# Patient Record
Sex: Female | Born: 1935 | Race: White | Hispanic: No | Smoking: Never smoker
Health system: Southern US, Community
[De-identification: ages and names within clinical notes are randomized; demographics above are authoritative.]

## PROBLEM LIST (undated history)

## (undated) ENCOUNTER — Inpatient Hospital Stay: Admission: AD | Payer: Medicare Other | Source: Other Acute Inpatient Hospital | Admitting: Internal Medicine

## (undated) DIAGNOSIS — F0390 Unspecified dementia without behavioral disturbance: Secondary | ICD-10-CM

## (undated) DIAGNOSIS — F32A Depression, unspecified: Secondary | ICD-10-CM

## (undated) DIAGNOSIS — F329 Major depressive disorder, single episode, unspecified: Secondary | ICD-10-CM

## (undated) DIAGNOSIS — I10 Essential (primary) hypertension: Secondary | ICD-10-CM

## (undated) DIAGNOSIS — E039 Hypothyroidism, unspecified: Secondary | ICD-10-CM

## (undated) DIAGNOSIS — F419 Anxiety disorder, unspecified: Secondary | ICD-10-CM

## (undated) HISTORY — PX: TUBAL LIGATION: SHX77

## (undated) HISTORY — PX: MINOR HEMORRHOIDECTOMY: SHX6238

## (undated) HISTORY — PX: TONSILLECTOMY: SUR1361

## (undated) HISTORY — PX: ABDOMINAL HYSTERECTOMY: SHX81

## (undated) HISTORY — PX: OTHER SURGICAL HISTORY: SHX169

---

## 2004-05-22 ENCOUNTER — Ambulatory Visit: Payer: Self-pay | Admitting: Internal Medicine

## 2005-06-26 ENCOUNTER — Ambulatory Visit: Payer: Self-pay | Admitting: Internal Medicine

## 2006-06-29 ENCOUNTER — Ambulatory Visit: Payer: Self-pay | Admitting: Internal Medicine

## 2007-07-01 ENCOUNTER — Ambulatory Visit: Payer: Self-pay | Admitting: Internal Medicine

## 2008-07-03 ENCOUNTER — Ambulatory Visit: Payer: Self-pay | Admitting: Internal Medicine

## 2009-07-05 ENCOUNTER — Ambulatory Visit: Payer: Self-pay | Admitting: Internal Medicine

## 2010-05-02 ENCOUNTER — Ambulatory Visit: Payer: Self-pay | Admitting: Gastroenterology

## 2010-05-07 ENCOUNTER — Ambulatory Visit: Payer: Self-pay | Admitting: Gastroenterology

## 2010-07-08 ENCOUNTER — Ambulatory Visit: Payer: Self-pay | Admitting: Internal Medicine

## 2011-07-24 ENCOUNTER — Ambulatory Visit: Payer: Self-pay | Admitting: Internal Medicine

## 2012-10-05 ENCOUNTER — Ambulatory Visit: Payer: Self-pay | Admitting: Internal Medicine

## 2013-11-08 ENCOUNTER — Ambulatory Visit: Payer: Self-pay | Admitting: Internal Medicine

## 2014-11-14 ENCOUNTER — Ambulatory Visit: Payer: Self-pay | Admitting: Internal Medicine

## 2015-09-09 ENCOUNTER — Emergency Department: Payer: Medicare Other

## 2015-09-09 ENCOUNTER — Encounter: Payer: Self-pay | Admitting: Emergency Medicine

## 2015-09-09 ENCOUNTER — Emergency Department
Admission: EM | Admit: 2015-09-09 | Discharge: 2015-09-09 | Disposition: A | Discharge status: Home / Self Care | Payer: Medicare Other | Attending: Emergency Medicine | Admitting: Emergency Medicine

## 2015-09-09 DIAGNOSIS — Y998 Other external cause status: Secondary | ICD-10-CM | POA: Diagnosis not present

## 2015-09-09 DIAGNOSIS — Y92009 Unspecified place in unspecified non-institutional (private) residence as the place of occurrence of the external cause: Secondary | ICD-10-CM | POA: Insufficient documentation

## 2015-09-09 DIAGNOSIS — S52122A Displaced fracture of head of left radius, initial encounter for closed fracture: Secondary | ICD-10-CM | POA: Diagnosis not present

## 2015-09-09 DIAGNOSIS — Y9389 Activity, other specified: Secondary | ICD-10-CM | POA: Diagnosis not present

## 2015-09-09 DIAGNOSIS — S6992XA Unspecified injury of left wrist, hand and finger(s), initial encounter: Secondary | ICD-10-CM | POA: Diagnosis present

## 2015-09-09 DIAGNOSIS — S60212A Contusion of left wrist, initial encounter: Secondary | ICD-10-CM | POA: Insufficient documentation

## 2015-09-09 DIAGNOSIS — Q7190 Unspecified reduction defect of unspecified upper limb: Secondary | ICD-10-CM

## 2015-09-09 DIAGNOSIS — W1839XA Other fall on same level, initial encounter: Secondary | ICD-10-CM | POA: Diagnosis not present

## 2015-09-09 MED ORDER — LIDOCAINE HCL (PF) 1 % IJ SOLN
INTRAMUSCULAR | Status: AC
  Administered 2015-09-09: 5 mL via INTRADERMAL
  Filled 2015-09-09: qty 5

## 2015-09-09 MED ORDER — HYDROCODONE-ACETAMINOPHEN 5-325 MG PO TABS: 1.0000 | ORAL_TABLET | Freq: Four times a day (QID) | ORAL | Status: DC | PRN

## 2015-09-09 MED ORDER — ONDANSETRON HCL 4 MG/2ML IJ SOLN
4.0000 mg | Freq: Once | INTRAMUSCULAR | Status: AC
  Administered 2015-09-09: 4 mg via INTRAVENOUS
  Filled 2015-09-09: qty 2

## 2015-09-09 MED ORDER — MORPHINE SULFATE (PF) 4 MG/ML IV SOLN
4.0000 mg | Freq: Once | INTRAVENOUS | Status: AC
  Administered 2015-09-09: 4 mg via INTRAVENOUS
  Filled 2015-09-09: qty 1

## 2015-09-09 MED ORDER — BUPIVACAINE HCL 0.5 % IJ SOLN
50.0000 mL | Freq: Once | INTRAMUSCULAR | Status: AC
  Administered 2015-09-09: 50 mL
  Filled 2015-09-09: qty 50

## 2015-09-09 MED ORDER — BUPIVACAINE HCL (PF) 0.5 % IJ SOLN
INTRAMUSCULAR | Status: AC
  Administered 2015-09-09: 50 mL
  Filled 2015-09-09: qty 30

## 2015-09-09 MED ORDER — LIDOCAINE HCL 1 % IJ SOLN
5.0000 mL | Freq: Once | INTRAMUSCULAR | Status: DC
  Filled 2015-09-09: qty 5

## 2015-09-09 MED ORDER — LIDOCAINE HCL (PF) 1 % IJ SOLN
5.0000 mL | Freq: Once | INTRAMUSCULAR | Status: AC
  Administered 2015-09-09: 5 mL via INTRADERMAL

## 2015-09-09 NOTE — Consult Note (Signed)
ORTHOPAEDIC CONSULTATION  REQUESTING PHYSICIAN: No att. providers found  Chief Complaint:   Left wrist pain.  History of Present Illness: Jaime [name] is a 82 y.o. female who lives independently. Apparently, she was at her refrigerator this morning when she slipped on some water and fell onto her outstretched left hand. She was brought to the emergency room where x-rays demonstrated a dorsally impacted and displaced fracture of the left distal radius. The patient denies any other injuries in the fall. She did not strike her head or lose consciousness. She also denies any lightheadedness, shortness of breath, chest pain, or other symptoms that may have precipitated her fall.  No past medical history on file. No past surgical history on file. Social History   Social History  . Marital Status: Widowed    Spouse Name: N/A  . Number of Children: N/A  . Years of Education: N/A   Social History Main Topics  . Smoking status: Never Smoker   . Smokeless tobacco: Not on file  . Alcohol Use: Not on file  . Drug Use: Not on file  . Sexual Activity: Not on file   Other Topics Concern  . Not on file   Social History Narrative  . No narrative on file   No family history on file. No Known Allergies Prior to Admission medications   Medication Sig Start Date End Date Taking? Authorizing Provider  HYDROcodone-acetaminophen (NORCO) 5-325 MG tablet Take 1-2 tablets by mouth every 6 (six) hours as needed for moderate pain. MAXIMUM TOTAL ACETAMINOPHEN DOSE IS 4000 MG PER DAY 09/09/15   Christena Flake, MD   Dg Wrist 2 Views Left  09/09/2015  CLINICAL DATA:  82 year old female status post reduction. Initial encounter. EXAM: LEFT WRIST - 2 VIEW COMPARISON:  1446 hours today. FINDINGS: PA and cross-table lateral views of the left wrist with splint material or cast material in place. Comminuted and impacted distal left radius fracture  with radiocarpal joint and DRU involvement re- demonstrated. Improved alignment, less dorsal angulation. Carpal bone alignment appears stable. Ulnar styloid fracture again noted. IMPRESSION: Improved alignment about the comminuted and impacted distal left radius fracture status post splinting, less dorsal angulation. Electronically Signed   By: Odessa Fleming M.D.   On: 09/09/2015 17:33   Dg Wrist Complete Left  09/09/2015  CLINICAL DATA:  Fall today with left wrist injury and deformity. Initial encounter. EXAM: LEFT WRIST - COMPLETE 3+ VIEW COMPARISON:  None. FINDINGS: Comminuted fracture of the distal radius demonstrates impaction and dorsal angulation. Associated small avulsive fracture off of the tip of the ulnar styloid. There is also potentially associated subluxation versus impaction of the ulna into the proximal carpal row. No carpal fractures are seen. IMPRESSION: Comminuted fracture of the distal left radius demonstrating impaction and dorsal angulation. Associated avulsion fracture at the tip of the ulnar styloid with subluxation versus impaction of the ulna into the proximal carpal row. Electronically Signed   By: Irish Lack M.D.   On: 09/09/2015 15:18    Positive ROS: All other systems have been reviewed and were otherwise negative with the exception of those mentioned in the HPI and as above.  Physical Exam: General:  Alert, no acute distress Psychiatric:  Patient is competent for consent with normal mood and affect   Cardiovascular:  No pedal edema Respiratory:  No wheezing, non-labored breathing GI:  Abdomen is soft and non-tender Skin:  No lesions in the area of chief complaint Neurologic:  Sensation intact distally Lymphatic:  No axillary or cervical lymphadenopathy  Orthopedic Exam:  Orthopedic examination is limited to the left hand and forearm. There is an obvious deformity of the left wrist with moderate swelling and early ecchymosis. The skin itself appears to be intact and  shows no evidence for lacerations, abrasions, erythema, or rashes. She has moderate tenderness to palpation over the distal radius. This pain is reproduced with any attempted active or passive motion of the wrist. She is able to actively flex and extend all digits, although motion is limited secondary to pain and apprehension. She has intact sensation to the radial, medial, and ulnar nerve distributions. She has excellent capillary refill to all digits.  X-rays:  AP, lateral, and oblique x-rays of the left wrist are available for review. These films demonstrate a dorsally impacted and displaced fracture of the left distal radius. The ulna appears to be intact. No other abnormalities are identified.  Assessment: Impacted and dorsally displaced left distal radius fracture.  Plan: The treatment options are discussed with the patient. After obtaining verbal consent, the distal radius fracture was reduced under a hematoma block with finger trap traction and manipulation before a sugar tong splint was applied. Post-reduction films demonstrated improved alignment, although not full and optimal reduction. The patient has been instructed to return to the office in 3-5 days for repeat x-rays. She understands that surgical intervention might be required. Meanwhile, she is to keep her hand elevated above heart level and keep the splint dry and intact. She is to apply ice frequently. She is encouraged to take over-the-counter medications as necessary for discomfort. In addition, a prescription for hydrocodone to take as necessary for more severe pain has been provided.  Thank you for asking me to participate in the care of this most pleasant woman. I will be happy to keep you abreast of her progress.   Maryagnes Amos, MD  Beeper #:  [phone]  09/09/2015 5:39 PM

## 2015-09-09 NOTE — ED Notes (Signed)
Orthopedic surgeon at bedside. 

## 2015-09-09 NOTE — Discharge Instructions (Signed)
Keep splint dry and intact. Keep hand elevated above heart level. Apply ice to affected area frequently. Return for follow-up in 3-5 days. Call [phone] for appointment. Take Advil 600 mg three times daily with food OR Aleve 2 tablets twice daily with food. Take Norco as prescribed when necessary for more severe pain.

## 2015-09-09 NOTE — ED Notes (Signed)
X-ray at bedside

## 2015-09-09 NOTE — ED Notes (Addendum)
Fall with wrist pain and deformity - no dizziness, tripped

## 2015-09-09 NOTE — ED Provider Notes (Signed)
[facility] Emergency Department Provider Note  ____________________________________________  Time seen: Approximately 2:52 PM  I have reviewed the triage vital signs and the nursing notes.   HISTORY  Chief Complaint Wrist Pain    HPI Jaime [name] is a 82 y.o. female who presents via EMS with complaints of falling at home and landing on her outstretched arm. Patient complains of left wrist pain. Patient denies hitting her head or denies any loss of consciousness.    No past medical history on file.  There are no active problems to display for this patient.   No past surgical history on file.  No current outpatient prescriptions on file.  Allergies Review of patient's allergies indicates no known allergies.  No family history on file.  Social History Social History  Substance Use Topics  . Smoking status: Never Smoker   . Smokeless tobacco: Not on file  . Alcohol Use: Not on file    Review of Systems Constitutional: No fever/chills Eyes: No visual changes. ENT: No sore throat. Cardiovascular: Denies chest pain. Respiratory: Denies shortness of breath. Gastrointestinal: No abdominal pain.  No nausea, no vomiting.  No diarrhea.  No constipation. Genitourinary: Negative for dysuria. Musculoskeletal: Negative for back pain. Skin: Negative for rash. Neurological: Negative for headaches, focal weakness or numbness.  10-point ROS otherwise negative.  ____________________________________________   PHYSICAL EXAM:  VITAL SIGNS: ED Triage Vitals  Enc Vitals Group     BP 09/09/15 1432 175/57 mmHg     Pulse Rate 09/09/15 1432 88     Resp 09/09/15 1432 20     Temp 09/09/15 1432 97.4 F (36.3 C)     Temp Source 09/09/15 1432 Oral     SpO2 09/09/15 1432 99 %     Weight 09/09/15 1432 180 lb (81.647 kg)     Height 09/09/15 1432  (1.651 m)     Head Cir --      Peak Flow --      Pain Score --      Pain Loc --      Pain Edu?  --      Excl. in GC? --     Constitutional: Alert and oriented. Well appearing and in no acute distress. Eyes: Conjunctivae are normal. PERRL. EOMI. Head: Atraumatic. Nose: No congestion/rhinnorhea. Mouth/Throat: Mucous membranes are moist.  Oropharynx non-erythematous. Neck: No stridor.   Cardiovascular: Normal rate, regular rhythm. Grossly normal heart sounds.  Good peripheral circulation. Respiratory: Normal respiratory effort.  No retractions. Lungs CTAB. Gastrointestinal: Soft and nontender. No distention. No abdominal bruits. No CVA tenderness. Musculoskeletal: Left wrist with obvious edema and deformity. Positive ecchymosis. Distally neurovascularly intact. Neurologic:  Normal speech and language. No gross focal neurologic deficits are appreciated. No gait instability. Skin:  Skin is warm, dry and intact. No rash noted. Psychiatric: Mood and affect are normal. Speech and behavior are normal.  ____________________________________________   LABS (all labs ordered are listed, but only abnormal results are displayed)  Labs Reviewed - No data to display ____________________________________________   RADIOLOGY FINDINGS: Comminuted fracture of the distal radius demonstrates impaction and dorsal angulation. Associated small avulsive fracture off of the tip of the ulnar styloid. There is also potentially associated subluxation versus impaction of the ulna into the proximal carpal row. No carpal fractures are seen.  IMPRESSION: Comminuted fracture of the distal left radius demonstrating impaction and dorsal angulation. Associated avulsion fracture at the tip of the ulnar styloid with subluxation versus impaction of the ulna into  the proximal carpal row.  ____________________________________________   PROCEDURES  Procedure(s) performed: None  Critical Care performed: No  ____________________________________________   INITIAL IMPRESSION / ASSESSMENT AND PLAN / ED  COURSE  Pertinent labs & imaging results that were available during my care of the patient were reviewed by me and considered in my medical decision making (see chart for details).  Comminuted fracture of the distal left radius with dorsal angulation. Associated avulsion fracture of the ulnar styloid with possible impaction to the proximal carpal row.  Discussed all clinical findings with Dr. Joice Lofts, orthopedic on call. We'll transfer the patient over to the main ED for reduction. Orders were given for finger trap traction, Sensorcaine and Xylocaine. ____________________________________________   FINAL CLINICAL IMPRESSION(S) / ED DIAGNOSES  Final diagnoses:  Radial head fracture, closed, left, initial encounter      Evangeline Dakin, PA-C 09/09/15 1543  Arnaldo Natal, MD 09/09/15 2102

## 2015-09-11 ENCOUNTER — Ambulatory Visit: Payer: Medicare Other | Admitting: Anesthesiology

## 2015-09-11 ENCOUNTER — Ambulatory Visit: Payer: Medicare Other

## 2015-09-11 ENCOUNTER — Ambulatory Visit
Admission: RE | Admit: 2015-09-11 | Discharge: 2015-09-11 | Disposition: A | Discharge status: Home / Self Care | Payer: Medicare Other | Source: Ambulatory Visit | Attending: Orthopedic Surgery | Admitting: Orthopedic Surgery

## 2015-09-11 ENCOUNTER — Encounter
Admission: RE | Disposition: A | Discharge status: Home / Self Care | Payer: Self-pay | Source: Ambulatory Visit | Attending: Orthopedic Surgery

## 2015-09-11 ENCOUNTER — Encounter: Payer: Self-pay | Admitting: Anesthesiology

## 2015-09-11 DIAGNOSIS — M8580 Other specified disorders of bone density and structure, unspecified site: Secondary | ICD-10-CM | POA: Diagnosis not present

## 2015-09-11 DIAGNOSIS — I10 Essential (primary) hypertension: Secondary | ICD-10-CM | POA: Insufficient documentation

## 2015-09-11 DIAGNOSIS — Z803 Family history of malignant neoplasm of breast: Secondary | ICD-10-CM | POA: Diagnosis not present

## 2015-09-11 DIAGNOSIS — F329 Major depressive disorder, single episode, unspecified: Secondary | ICD-10-CM | POA: Diagnosis not present

## 2015-09-11 DIAGNOSIS — Z90710 Acquired absence of both cervix and uterus: Secondary | ICD-10-CM | POA: Insufficient documentation

## 2015-09-11 DIAGNOSIS — S52572A Other intraarticular fracture of lower end of left radius, initial encounter for closed fracture: Secondary | ICD-10-CM | POA: Diagnosis not present

## 2015-09-11 DIAGNOSIS — Y9389 Activity, other specified: Secondary | ICD-10-CM | POA: Diagnosis not present

## 2015-09-11 DIAGNOSIS — S52502A Unspecified fracture of the lower end of left radius, initial encounter for closed fracture: Secondary | ICD-10-CM | POA: Diagnosis present

## 2015-09-11 DIAGNOSIS — R42 Dizziness and giddiness: Secondary | ICD-10-CM | POA: Insufficient documentation

## 2015-09-11 DIAGNOSIS — Z820 Family history of epilepsy and other diseases of the nervous system: Secondary | ICD-10-CM | POA: Diagnosis not present

## 2015-09-11 DIAGNOSIS — E785 Hyperlipidemia, unspecified: Secondary | ICD-10-CM | POA: Diagnosis not present

## 2015-09-11 DIAGNOSIS — E538 Deficiency of other specified B group vitamins: Secondary | ICD-10-CM | POA: Insufficient documentation

## 2015-09-11 DIAGNOSIS — F418 Other specified anxiety disorders: Secondary | ICD-10-CM | POA: Insufficient documentation

## 2015-09-11 DIAGNOSIS — Z79899 Other long term (current) drug therapy: Secondary | ICD-10-CM | POA: Insufficient documentation

## 2015-09-11 DIAGNOSIS — E039 Hypothyroidism, unspecified: Secondary | ICD-10-CM | POA: Diagnosis not present

## 2015-09-11 DIAGNOSIS — M1710 Unilateral primary osteoarthritis, unspecified knee: Secondary | ICD-10-CM | POA: Diagnosis not present

## 2015-09-11 DIAGNOSIS — Z8781 Personal history of (healed) traumatic fracture: Secondary | ICD-10-CM

## 2015-09-11 DIAGNOSIS — Z9889 Other specified postprocedural states: Secondary | ICD-10-CM

## 2015-09-11 DIAGNOSIS — F419 Anxiety disorder, unspecified: Secondary | ICD-10-CM | POA: Diagnosis not present

## 2015-09-11 DIAGNOSIS — R51 Headache: Secondary | ICD-10-CM | POA: Insufficient documentation

## 2015-09-11 DIAGNOSIS — W010XXA Fall on same level from slipping, tripping and stumbling without subsequent striking against object, initial encounter: Secondary | ICD-10-CM | POA: Diagnosis not present

## 2015-09-11 DIAGNOSIS — Y92000 Kitchen of unspecified non-institutional (private) residence as  the place of occurrence of the external cause: Secondary | ICD-10-CM | POA: Insufficient documentation

## 2015-09-11 DIAGNOSIS — Z818 Family history of other mental and behavioral disorders: Secondary | ICD-10-CM | POA: Insufficient documentation

## 2015-09-11 HISTORY — DX: Essential (primary) hypertension: I10

## 2015-09-11 HISTORY — DX: Hypothyroidism, unspecified: E03.9

## 2015-09-11 HISTORY — DX: Major depressive disorder, single episode, unspecified: F32.9

## 2015-09-11 HISTORY — PX: OPEN REDUCTION INTERNAL FIXATION (ORIF) DISTAL RADIAL FRACTURE: SHX5989

## 2015-09-11 HISTORY — DX: Anxiety disorder, unspecified: F41.9

## 2015-09-11 HISTORY — DX: Depression, unspecified: F32.A

## 2015-09-11 SURGERY — OPEN REDUCTION INTERNAL FIXATION (ORIF) DISTAL RADIUS FRACTURE
Anesthesia: General | Laterality: Left | Wound class: Clean

## 2015-09-11 MED ORDER — CEFAZOLIN SODIUM-DEXTROSE 2-3 GM-% IV SOLR
INTRAVENOUS | Status: AC
  Filled 2015-09-11: qty 50

## 2015-09-11 MED ORDER — LABETALOL HCL 5 MG/ML IV SOLN
INTRAVENOUS | Status: AC
  Administered 2015-09-11: 5 mg via INTRAVENOUS
  Filled 2015-09-11: qty 4

## 2015-09-11 MED ORDER — METOCLOPRAMIDE HCL 5 MG/ML IJ SOLN
INTRAMUSCULAR | Status: DC | PRN
  Administered 2015-09-11: 5 mg via INTRAVENOUS
  Administered 2015-09-11 (×2): 2.5 mg via INTRAVENOUS

## 2015-09-11 MED ORDER — HYDROCODONE-ACETAMINOPHEN 5-325 MG PO TABS
ORAL_TABLET | ORAL | Status: AC
  Filled 2015-09-11: qty 1

## 2015-09-11 MED ORDER — CEFAZOLIN SODIUM-DEXTROSE 2-3 GM-% IV SOLR
2.0000 g | Freq: Once | INTRAVENOUS | Status: AC
  Administered 2015-09-11: 2 g via INTRAVENOUS

## 2015-09-11 MED ORDER — PROPOFOL 10 MG/ML IV BOLUS
INTRAVENOUS | Status: DC | PRN
  Administered 2015-09-11 (×2): 50 mg via INTRAVENOUS
  Administered 2015-09-11: 100 mg via INTRAVENOUS

## 2015-09-11 MED ORDER — HYDROMORPHONE HCL 1 MG/ML IJ SOLN
INTRAMUSCULAR | Status: DC | PRN
  Administered 2015-09-11: 1 mg via INTRAVENOUS

## 2015-09-11 MED ORDER — HYDROCODONE-ACETAMINOPHEN 5-325 MG PO TABS
1.0000 | ORAL_TABLET | Freq: Four times a day (QID) | ORAL | Status: DC | PRN
Start: 2015-09-11 — End: 2018-04-19

## 2015-09-11 MED ORDER — ACETAMINOPHEN 10 MG/ML IV SOLN
INTRAVENOUS | Status: DC | PRN
  Administered 2015-09-11: 1000 mg via INTRAVENOUS

## 2015-09-11 MED ORDER — FENTANYL CITRATE (PF) 100 MCG/2ML IJ SOLN
INTRAMUSCULAR | Status: DC | PRN
  Administered 2015-09-11 (×2): 25 ug via INTRAVENOUS
  Administered 2015-09-11: 50 ug via INTRAVENOUS

## 2015-09-11 MED ORDER — LABETALOL HCL 5 MG/ML IV SOLN
5.0000 mg | INTRAVENOUS | Status: AC | PRN
  Administered 2015-09-11 (×2): 5 mg via INTRAVENOUS

## 2015-09-11 MED ORDER — METOCLOPRAMIDE HCL 10 MG PO TABS: 5.0000 mg | ORAL_TABLET | Freq: Three times a day (TID) | ORAL | Status: DC | PRN

## 2015-09-11 MED ORDER — FENTANYL CITRATE (PF) 100 MCG/2ML IJ SOLN
25.0000 ug | INTRAMUSCULAR | Status: DC | PRN
  Administered 2015-09-11 (×4): 25 ug via INTRAVENOUS

## 2015-09-11 MED ORDER — METOCLOPRAMIDE HCL 5 MG/ML IJ SOLN: 5.0000 mg | Freq: Three times a day (TID) | INTRAMUSCULAR | Status: DC | PRN

## 2015-09-11 MED ORDER — LABETALOL HCL 5 MG/ML IV SOLN
INTRAVENOUS | Status: DC | PRN
  Administered 2015-09-11: 15 mg via INTRAVENOUS

## 2015-09-11 MED ORDER — FENTANYL CITRATE (PF) 100 MCG/2ML IJ SOLN
INTRAMUSCULAR | Status: AC
  Administered 2015-09-11: 25 ug via INTRAVENOUS
  Filled 2015-09-11: qty 2

## 2015-09-11 MED ORDER — SODIUM CHLORIDE 0.9 % IV SOLN: INTRAVENOUS | Status: DC

## 2015-09-11 MED ORDER — LACTATED RINGERS IV SOLN
INTRAVENOUS | Status: DC
  Administered 2015-09-11: 10:00:00 via INTRAVENOUS

## 2015-09-11 MED ORDER — GLYCOPYRROLATE 0.2 MG/ML IJ SOLN
INTRAMUSCULAR | Status: DC | PRN
  Administered 2015-09-11: 0.2 mg via INTRAVENOUS

## 2015-09-11 MED ORDER — ONDANSETRON HCL 4 MG PO TABS
4.0000 mg | ORAL_TABLET | Freq: Four times a day (QID) | ORAL | Status: DC | PRN
Start: 2015-09-11 — End: 2015-09-11

## 2015-09-11 MED ORDER — ONDANSETRON HCL 4 MG/2ML IJ SOLN
INTRAMUSCULAR | Status: DC | PRN
  Administered 2015-09-11: 4 mg via INTRAVENOUS

## 2015-09-11 MED ORDER — NEOMYCIN-POLYMYXIN B GU 40-200000 IR SOLN
Status: AC
  Filled 2015-09-11: qty 2

## 2015-09-11 MED ORDER — HYDROCODONE-ACETAMINOPHEN 5-325 MG PO TABS
1.0000 | ORAL_TABLET | ORAL | Status: DC | PRN
  Administered 2015-09-11: 1 via ORAL

## 2015-09-11 MED ORDER — LIDOCAINE HCL (CARDIAC) 20 MG/ML IV SOLN
INTRAVENOUS | Status: DC | PRN
  Administered 2015-09-11: 100 mg via INTRAVENOUS

## 2015-09-11 MED ORDER — ACETAMINOPHEN 10 MG/ML IV SOLN
INTRAVENOUS | Status: AC
  Filled 2015-09-11: qty 100

## 2015-09-11 MED ORDER — ONDANSETRON HCL 4 MG/2ML IJ SOLN: 4.0000 mg | Freq: Once | INTRAMUSCULAR | Status: DC | PRN

## 2015-09-11 MED ORDER — NEOMYCIN-POLYMYXIN B GU 40-200000 IR SOLN
Status: DC | PRN
  Administered 2015-09-11: 2 mL

## 2015-09-11 MED ORDER — ONDANSETRON HCL 4 MG/2ML IJ SOLN: 4.0000 mg | Freq: Four times a day (QID) | INTRAMUSCULAR | Status: DC | PRN

## 2015-09-11 SURGICAL SUPPLY — 40 items
BANDAGE ACE 4X5 VEL STRL LF (GAUZE/BANDAGES/DRESSINGS) ×3 IMPLANT
BIT DRILL 2 FAST STEP (BIT) ×3 IMPLANT
BIT DRILL 2.5X4 QC (BIT) ×3 IMPLANT
CANISTER SUCT 1200ML W/VALVE (MISCELLANEOUS) ×3 IMPLANT
CHLORAPREP W/TINT 26ML (MISCELLANEOUS) ×3 IMPLANT
DRAPE FLUOR MINI C-ARM 54X84 (DRAPES) ×3 IMPLANT
ELECT REM PT RETURN 9FT ADLT (ELECTROSURGICAL) ×3
ELECTRODE REM PT RTRN 9FT ADLT (ELECTROSURGICAL) ×1 IMPLANT
GAUZE PETRO XEROFOAM 1X8 (MISCELLANEOUS) ×6 IMPLANT
GAUZE SPONGE 4X4 12PLY STRL (GAUZE/BANDAGES/DRESSINGS) ×3 IMPLANT
GLOVE BIOGEL PI IND STRL 9 (GLOVE) ×1 IMPLANT
GLOVE BIOGEL PI INDICATOR 9 (GLOVE) ×2
GLOVE SURG ORTHO 9.0 STRL STRW (GLOVE) ×3 IMPLANT
GOWN SPECIALTY ULTRA XL (MISCELLANEOUS) ×3 IMPLANT
GOWN STRL REUS W/ TWL LRG LVL3 (GOWN DISPOSABLE) ×1 IMPLANT
GOWN STRL REUS W/TWL LRG LVL3 (GOWN DISPOSABLE) ×2
K-WIRE 1.6 (WIRE) ×2
K-WIRE FX5X1.6XNS BN SS (WIRE) ×1
KIT RM TURNOVER STRD PROC AR (KITS) ×3 IMPLANT
KWIRE FX5X1.6XNS BN SS (WIRE) ×1 IMPLANT
NEEDLE FILTER BLUNT 18X 1/2SAF (NEEDLE) ×2
NEEDLE FILTER BLUNT 18X1 1/2 (NEEDLE) ×1 IMPLANT
NS IRRIG 500ML POUR BTL (IV SOLUTION) ×3 IMPLANT
PACK EXTREMITY ARMC (MISCELLANEOUS) ×3 IMPLANT
PAD CAST CTTN 4X4 STRL (SOFTGOODS) ×2 IMPLANT
PADDING CAST COTTON 4X4 STRL (SOFTGOODS) ×4
PEG SUBCHONDRAL SMOOTH 2.0X14 (Peg) ×3 IMPLANT
PEG SUBCHONDRAL SMOOTH 2.0X22 (Peg) ×3 IMPLANT
PEG SUBCHONDRAL SMOOTH 2.0X24 (Peg) ×3 IMPLANT
PLATE SHORT 21.6X48.9 NRRW LT (Plate) ×3 IMPLANT
SCREW CORT 3.5X10 LNG (Screw) ×9 IMPLANT
SCREW MULTI DIRECT 20MM (Screw) ×6 IMPLANT
SCREW MULTI DIRECT 22MM (Screw) ×3 IMPLANT
SPLINT CAST 1 STEP 3X12 (MISCELLANEOUS) ×3 IMPLANT
STOCKINETTE STRL 4IN 9604848 (GAUZE/BANDAGES/DRESSINGS) ×3 IMPLANT
SUT ETHILON 4-0 (SUTURE) ×2
SUT ETHILON 4-0 FS2 18XMFL BLK (SUTURE) ×1
SUT VICRYL 3-0 27IN (SUTURE) ×3 IMPLANT
SUTURE ETHLN 4-0 FS2 18XMF BLK (SUTURE) ×1 IMPLANT
SYR 3ML LL SCALE MARK (SYRINGE) ×3 IMPLANT

## 2015-09-11 NOTE — OR Nursing (Signed)
Fingers on left hand are bruised but distal circulation is adequate.

## 2015-09-11 NOTE — Anesthesia Preprocedure Evaluation (Addendum)
Anesthesia Evaluation  Patient identified by MRN, date of birth, ID band Patient awake    Reviewed: Allergy & Precautions, NPO status , Patient's Chart, lab work & pertinent test results, reviewed documented beta blocker date and time   Airway Mallampati: II  TM Distance: >3 FB     Dental  (+) Chipped, Partial Lower   Pulmonary           Cardiovascular hypertension, Pt. on medications      Neuro/Psych PSYCHIATRIC DISORDERS Anxiety Depression    GI/Hepatic   Endo/Other  Hypothyroidism   Renal/GU      Musculoskeletal   Abdominal   Peds  Hematology   Anesthesia Other Findings She is hypertensive and takes micardis. Synthroid. Takes meds for anxiety.  Reproductive/Obstetrics                            Anesthesia Physical Anesthesia Plan  ASA: II  Anesthesia Plan: General   Post-op Pain Management:    Induction: Intravenous  Airway Management Planned: LMA  Additional Equipment:   Intra-op Plan:   Post-operative Plan:   Informed Consent: I have reviewed the patients History and Physical, chart, labs and discussed the procedure including the risks, benefits and alternatives for the proposed anesthesia with the patient or authorized representative who has indicated his/her understanding and acceptance.     Plan Discussed with: CRNA  Anesthesia Plan Comments:         Anesthesia Quick Evaluation

## 2015-09-11 NOTE — Transfer of Care (Signed)
Immediate Anesthesia Transfer of Care Note  Patient: Jaime [name]  Procedure(s) Performed: Procedure(s): OPEN REDUCTION INTERNAL FIXATION (ORIF) DISTAL RADIAL FRACTURE (Left)  Patient Location: PACU  Anesthesia Type:General  Level of Consciousness: awake, alert , oriented and patient cooperative  Airway & Oxygen Therapy: Patient Spontanous Breathing and Patient connected to nasal cannula oxygen  Post-op Assessment: Report given to RN and Post -op Vital signs reviewed and stable  Post vital signs: Reviewed and stable  Last Vitals:  Filed Vitals:   09/11/15 0952 09/11/15 1215  BP: 164/61 143/63  Pulse: 88 75  Temp: 36.4 C 36 C  Resp: 16 14    Complications: No apparent anesthesia complications

## 2015-09-11 NOTE — Anesthesia Procedure Notes (Signed)
Procedure Name: LMA Insertion Date/Time: 09/11/2015 11:11 AM Performed by: Shirlee Limerick, Amin Fornwalt Pre-anesthesia Checklist: Patient identified, Emergency Drugs available, Suction available and Patient being monitored Patient Re-evaluated:Patient Re-evaluated prior to inductionOxygen Delivery Method: Circle system utilized Preoxygenation: Pre-oxygenation with 100% oxygen Intubation Type: IV induction LMA: LMA inserted LMA Size: 3.5 Placement Confirmation: breath sounds checked- equal and bilateral Dental Injury: Teeth and Oropharynx as per pre-operative assessment

## 2015-09-11 NOTE — Anesthesia Postprocedure Evaluation (Signed)
Anesthesia Post Note  Patient: Jaime [name]  Procedure(s) Performed: Procedure(s) (LRB): OPEN REDUCTION INTERNAL FIXATION (ORIF) DISTAL RADIAL FRACTURE (Left)  Patient location during evaluation: PACU Anesthesia Type: General Level of consciousness: awake Pain management: pain level controlled Vital Signs Assessment: post-procedure vital signs reviewed and stable Respiratory status: spontaneous breathing Cardiovascular status: blood pressure returned to baseline Anesthetic complications: no    Last Vitals:  Filed Vitals:   09/11/15 1352 09/11/15 1435  BP: 150/56 151/60  Pulse: 72 69  Temp: 36.7 C   Resp: 16 16    Last Pain:  Filed Vitals:   09/11/15 1438  PainSc: 3                  Blase Beckner S

## 2015-09-11 NOTE — OR Nursing (Signed)
Dr Maisie Fus notified of pt reporting drinking 1/2 cup coffee-black at 0700 am

## 2015-09-11 NOTE — Discharge Instructions (Signed)

## 2015-09-11 NOTE — Op Note (Signed)
09/11/2015  12:14 PM  PATIENT:  Jaime [name]  82 y.o. female  PRE-OPERATIVE DIAGNOSIS:  DISTAL RADIAL FRACTURE comminuted intra-articular  POST-OPERATIVE DIAGNOSIS:  DISTAL RADIAL FRACTURE  PROCEDURE:  Procedure(s): OPEN REDUCTION INTERNAL FIXATION (ORIF) DISTAL RADIAL FRACTURE (Left)  SURGEON: Leitha Schuller, MD  ASSISTANTS: None  ANESTHESIA:   general  EBL:  Total I/O In: 600 [I.V.:600] Out: 10 [Blood:10]  BLOOD ADMINISTERED:none  DRAINS: none   LOCAL MEDICATIONS USED:  NONE  SPECIMEN:  No Specimen  DISPOSITION OF SPECIMEN:  N/A  COUNTS:  YES  TOURNIQUET:   26 minutes at 2 50 mmHg  IMPLANTS: [employer] DVR locking plate short narrow with multiple screws and pegs  DICTATION: .Dragon Dictation patient brought the operating room and after adequate anesthesia was obtained the left arm was prepped and draped in sterile fashion. After patient identification and timeout procedure were finished the tourniquet was raised. A volar approach is made centered over the FCR tendon tendon sheath incised and the tendon retracted radially deep fascia incised and the pronator elevated off the distal fragment. Fingertrap traction was applied and*the case and this helped restore length and gave close to anatomic alignment of the intra-articular fractures. A volar locking plate was then placed and pinned care were used to hold it in position. Distal screw peg holes were filled the proximal row distally the needed the multidirectional school screws to prevent joint penetration. The shaft was then fixed with 310 mm screws. Traction was removed and under fluoroscopic views the fracture appeared nearly anatomic and was stable with passive range of motion. Tourniquet was let down and the wound irrigated and closed with 3-0 Vicryl subcutaneously and 4-0 nylon for the skin. Xeroform 4 x 4 web roll and volar splint applied followed by an Ace wrap  PLAN OF CARE: Discharge to home after  PACU  PATIENT DISPOSITION:  PACU - hemodynamically stable.

## 2015-09-11 NOTE — H&P (Signed)
Reviewed paper H+P, will be scanned into chart. No changes noted.  

## 2015-10-25 ENCOUNTER — Other Ambulatory Visit: Payer: Self-pay | Admitting: Internal Medicine

## 2015-10-25 DIAGNOSIS — Z1231 Encounter for screening mammogram for malignant neoplasm of breast: Secondary | ICD-10-CM

## 2015-11-21 ENCOUNTER — Ambulatory Visit
Admission: RE | Admit: 2015-11-21 | Discharge: 2015-11-21 | Disposition: A | Discharge status: Home / Self Care | Payer: Medicare Other | Source: Ambulatory Visit | Attending: Internal Medicine | Admitting: Internal Medicine

## 2015-11-21 DIAGNOSIS — Z1231 Encounter for screening mammogram for malignant neoplasm of breast: Secondary | ICD-10-CM | POA: Insufficient documentation

## 2017-05-15 ENCOUNTER — Other Ambulatory Visit: Payer: Self-pay | Admitting: Internal Medicine

## 2017-05-15 DIAGNOSIS — Z1239 Encounter for other screening for malignant neoplasm of breast: Secondary | ICD-10-CM

## 2018-04-19 ENCOUNTER — Emergency Department: Payer: Medicare Other

## 2018-04-19 ENCOUNTER — Encounter: Payer: Self-pay | Admitting: Emergency Medicine

## 2018-04-19 ENCOUNTER — Inpatient Hospital Stay
Admission: EM | Admit: 2018-04-19 | Discharge: 2018-04-21 | DRG: 091 | Disposition: A | Discharge status: Home Health | Payer: Medicare Other | Attending: Internal Medicine | Admitting: Internal Medicine

## 2018-04-19 ENCOUNTER — Other Ambulatory Visit: Payer: Self-pay

## 2018-04-19 DIAGNOSIS — F0391 Unspecified dementia with behavioral disturbance: Secondary | ICD-10-CM | POA: Diagnosis present

## 2018-04-19 DIAGNOSIS — T50905A Adverse effect of unspecified drugs, medicaments and biological substances, initial encounter: Secondary | ICD-10-CM | POA: Diagnosis present

## 2018-04-19 DIAGNOSIS — F0281 Dementia in other diseases classified elsewhere with behavioral disturbance: Secondary | ICD-10-CM | POA: Diagnosis not present

## 2018-04-19 DIAGNOSIS — F419 Anxiety disorder, unspecified: Secondary | ICD-10-CM | POA: Diagnosis present

## 2018-04-19 DIAGNOSIS — Z79890 Hormone replacement therapy (postmenopausal): Secondary | ICD-10-CM

## 2018-04-19 DIAGNOSIS — R4182 Altered mental status, unspecified: Secondary | ICD-10-CM

## 2018-04-19 DIAGNOSIS — G9340 Encephalopathy, unspecified: Secondary | ICD-10-CM | POA: Diagnosis present

## 2018-04-19 DIAGNOSIS — G92 Toxic encephalopathy: Secondary | ICD-10-CM | POA: Diagnosis present

## 2018-04-19 DIAGNOSIS — R946 Abnormal results of thyroid function studies: Secondary | ICD-10-CM | POA: Diagnosis present

## 2018-04-19 DIAGNOSIS — I10 Essential (primary) hypertension: Secondary | ICD-10-CM | POA: Diagnosis present

## 2018-04-19 DIAGNOSIS — E43 Unspecified severe protein-calorie malnutrition: Secondary | ICD-10-CM | POA: Diagnosis present

## 2018-04-19 DIAGNOSIS — R4189 Other symptoms and signs involving cognitive functions and awareness: Secondary | ICD-10-CM | POA: Diagnosis present

## 2018-04-19 DIAGNOSIS — F329 Major depressive disorder, single episode, unspecified: Secondary | ICD-10-CM | POA: Diagnosis present

## 2018-04-19 DIAGNOSIS — Z90710 Acquired absence of both cervix and uterus: Secondary | ICD-10-CM

## 2018-04-19 DIAGNOSIS — Z9119 Patient's noncompliance with other medical treatment and regimen: Secondary | ICD-10-CM

## 2018-04-19 DIAGNOSIS — F03918 Unspecified dementia, unspecified severity, with other behavioral disturbance: Secondary | ICD-10-CM

## 2018-04-19 DIAGNOSIS — E039 Hypothyroidism, unspecified: Secondary | ICD-10-CM | POA: Diagnosis present

## 2018-04-19 DIAGNOSIS — R680 Hypothermia, not associated with low environmental temperature: Secondary | ICD-10-CM | POA: Diagnosis present

## 2018-04-19 DIAGNOSIS — Z79899 Other long term (current) drug therapy: Secondary | ICD-10-CM

## 2018-04-19 DIAGNOSIS — G301 Alzheimer's disease with late onset: Secondary | ICD-10-CM | POA: Diagnosis not present

## 2018-04-19 LAB — BLOOD GAS, ARTERIAL
ACID-BASE DEFICIT: 0.3 mmol/L (ref 0.0–2.0)
Bicarbonate: 24 mmol/L (ref 20.0–28.0)
FIO2: 0.28
O2 Saturation: 98.3 %
Patient temperature: 37
pCO2 arterial: 37 mmHg (ref 32.0–48.0)
pH, Arterial: 7.42 (ref 7.350–7.450)
pO2, Arterial: 108 mmHg (ref 83.0–108.0)

## 2018-04-19 LAB — URINE DRUG SCREEN, QUALITATIVE (ARMC ONLY)
AMPHETAMINES, UR SCREEN: NOT DETECTED
Barbiturates, Ur Screen: NOT DETECTED
Cannabinoid 50 Ng, Ur ~~LOC~~: NOT DETECTED
Cocaine Metabolite,Ur ~~LOC~~: NOT DETECTED
MDMA (ECSTASY) UR SCREEN: NOT DETECTED
METHADONE SCREEN, URINE: NOT DETECTED
OPIATE, UR SCREEN: NOT DETECTED
Phencyclidine (PCP) Ur S: NOT DETECTED
Tricyclic, Ur Screen: NOT DETECTED

## 2018-04-19 LAB — CBC WITH DIFFERENTIAL/PLATELET
Basophils Absolute: 0 10*3/uL (ref 0–0.1)
Basophils Relative: 0 %
Eosinophils Absolute: 0 10*3/uL (ref 0–0.7)
Eosinophils Relative: 0 %
HEMATOCRIT: 38.2 % (ref 35.0–47.0)
HEMOGLOBIN: 13.5 g/dL (ref 12.0–16.0)
LYMPHS PCT: 8 %
Lymphs Abs: 0.7 10*3/uL — ABNORMAL LOW (ref 1.0–3.6)
MCH: 32.2 pg (ref 26.0–34.0)
MCHC: 35.3 g/dL (ref 32.0–36.0)
MCV: 91.1 fL (ref 80.0–100.0)
Monocytes Absolute: 0.5 10*3/uL (ref 0.2–0.9)
Monocytes Relative: 6 %
NEUTROS ABS: 7.7 10*3/uL — AB (ref 1.4–6.5)
NEUTROS PCT: 86 %
Platelets: 263 10*3/uL (ref 150–440)
RBC: 4.2 MIL/uL (ref 3.80–5.20)
RDW: 13.5 % (ref 11.5–14.5)
WBC: 9 10*3/uL (ref 3.6–11.0)

## 2018-04-19 LAB — URINALYSIS, COMPLETE (UACMP) WITH MICROSCOPIC
Bacteria, UA: NONE SEEN
Bilirubin Urine: NEGATIVE
Glucose, UA: NEGATIVE mg/dL
KETONES UR: NEGATIVE mg/dL
Leukocytes, UA: NEGATIVE
Nitrite: NEGATIVE
PH: 7 (ref 5.0–8.0)
Protein, ur: NEGATIVE mg/dL
Specific Gravity, Urine: 1.013 (ref 1.005–1.030)

## 2018-04-19 LAB — COMPREHENSIVE METABOLIC PANEL
ALK PHOS: 86 U/L (ref 38–126)
ALT: 12 U/L (ref 0–44)
AST: 22 U/L (ref 15–41)
Albumin: 3.8 g/dL (ref 3.5–5.0)
Anion gap: 10 (ref 5–15)
BUN: 15 mg/dL (ref 8–23)
CO2: 25 mmol/L (ref 22–32)
CREATININE: 0.57 mg/dL (ref 0.44–1.00)
Calcium: 9.1 mg/dL (ref 8.9–10.3)
Chloride: 98 mmol/L (ref 98–111)
GFR calc Af Amer: >60 mL/min (ref >60)
GFR calc non Af Amer: >60 mL/min (ref >60)
Glucose, Bld: 139 mg/dL — ABNORMAL HIGH (ref 70–99)
Potassium: 4.5 mmol/L (ref 3.5–5.1)
Sodium: 133 mmol/L — ABNORMAL LOW (ref 135–145)
Total Bilirubin: 0.5 mg/dL (ref 0.3–1.2)
Total Protein: 6.7 g/dL (ref 6.5–8.1)

## 2018-04-19 LAB — SEDIMENTATION RATE: Sed Rate: 20 mm/hr (ref 0–30)

## 2018-04-19 LAB — VITAMIN B12: VITAMIN B 12: 223 pg/mL (ref 180–914)

## 2018-04-19 LAB — LACTIC ACID, PLASMA: LACTIC ACID, VENOUS: 1 mmol/L (ref 0.5–1.9)

## 2018-04-19 LAB — CK: Total CK: 152 U/L (ref 38–234)

## 2018-04-19 LAB — SALICYLATE LEVEL: Salicylate Lvl: <7 mg/dL (ref 2.8–30.0)

## 2018-04-19 LAB — AMMONIA: Ammonia: 15 umol/L (ref 9–35)

## 2018-04-19 LAB — ETHANOL: Alcohol, Ethyl (B): <10 mg/dL (ref <10)

## 2018-04-19 LAB — TROPONIN I

## 2018-04-19 LAB — ACETAMINOPHEN LEVEL: Acetaminophen (Tylenol), Serum: <10 ug/mL — ABNORMAL LOW (ref 10–30)

## 2018-04-19 MED ORDER — ENOXAPARIN SODIUM 40 MG/0.4ML ~~LOC~~ SOLN
40.0000 mg | SUBCUTANEOUS | Status: DC
  Administered 2018-04-19 – 2018-04-20 (×2): 40 mg via SUBCUTANEOUS
  Filled 2018-04-19 (×2): qty 0.4

## 2018-04-19 MED ORDER — SODIUM CHLORIDE 0.9 % IV SOLN
INTRAVENOUS | Status: DC | PRN
  Administered 2018-04-19: 500 mL via INTRAVENOUS

## 2018-04-19 MED ORDER — ONDANSETRON HCL 4 MG PO TABS: 4.0000 mg | ORAL_TABLET | Freq: Four times a day (QID) | ORAL | Status: DC | PRN

## 2018-04-19 MED ORDER — PIPERACILLIN-TAZOBACTAM 3.375 G IVPB
3.3750 g | Freq: Three times a day (TID) | INTRAVENOUS | Status: DC
  Administered 2018-04-19 – 2018-04-20 (×4): 3.375 g via INTRAVENOUS
  Filled 2018-04-19 (×4): qty 50

## 2018-04-19 MED ORDER — ACETAMINOPHEN 650 MG RE SUPP: 650.0000 mg | Freq: Four times a day (QID) | RECTAL | Status: DC | PRN

## 2018-04-19 MED ORDER — ONDANSETRON HCL 4 MG/2ML IJ SOLN: 4.0000 mg | Freq: Four times a day (QID) | INTRAMUSCULAR | Status: DC | PRN

## 2018-04-19 MED ORDER — NALOXONE HCL 2 MG/2ML IJ SOSY
0.4000 mg | PREFILLED_SYRINGE | Freq: Once | INTRAMUSCULAR | Status: AC
  Administered 2018-04-19: 0.4 mg via INTRAVENOUS
  Filled 2018-04-19: qty 2

## 2018-04-19 MED ORDER — DEXTROSE-NACL 5-0.45 % IV SOLN
INTRAVENOUS | Status: DC
  Administered 2018-04-19 – 2018-04-21 (×4): via INTRAVENOUS

## 2018-04-19 MED ORDER — ACETAMINOPHEN 325 MG PO TABS
650.0000 mg | ORAL_TABLET | Freq: Four times a day (QID) | ORAL | Status: DC | PRN
  Administered 2018-04-20 (×2): 650 mg via ORAL
  Filled 2018-04-19 (×2): qty 2

## 2018-04-19 MED ORDER — SODIUM CHLORIDE 0.9 % IV SOLN
Freq: Once | INTRAVENOUS | Status: AC
  Administered 2018-04-19: 08:00:00 via INTRAVENOUS

## 2018-04-19 NOTE — Progress Notes (Signed)
Advanced care plan.  Purpose of the Encounter: CODE STATUS  Parties in Attendance: Herself and discussed with daughter Jaime [name]  Patient's Decision Capacity: Not intact  Subjective/Patient's story:  Patient is 84 year old admitted with acute encephalopathy hypothermia  Objective/Medical story  Discussed with patient's daughter regarding CODE STATUS, their desires for patient to be intubated or resuscitated  Goals of care determination:  Full code for now if patient has to be intubated family will decide on further course of action   CODE STATUS: Full code   Time spent discussing advanced care planning: 16 minutes

## 2018-04-19 NOTE — ED Triage Notes (Signed)
Pt presents to ED via [facility] with c/o altered mental status. Per report from [place], [place] [facility] stated that patient was found on the floor altered and semi-responsive with dried stool noted to buttocks. Pt semi-responsive to pain on arrival. EDP to bedside at this time.

## 2018-04-19 NOTE — Plan of Care (Signed)

## 2018-04-19 NOTE — ED Notes (Signed)
Brett Canales, RN notified of MD orders. Explained will bring patient up ASAP.

## 2018-04-19 NOTE — ED Provider Notes (Signed)
[facility] Emergency Department Provider Note       Time seen: ----------------------------------------- 7:02 AM on 04/19/2018 ----------------------------------------- Level V caveat: History/ROS limited by altered mental status  I have reviewed the triage vital signs and the nursing notes.  HISTORY   Chief Complaint No chief complaint on file.    HPI Jaime [name] is a 84 y.o. female with a history of anxiety, depression, hypertension and hypothyroidism who presents to the ED for altered mental status.  Reportedly she lives at home with a disabled son and has altered mental status.  She was found in the floor and had been there for many hours.  No further information is available.  Past Medical History:  Diagnosis Date  . Anxiety   . Depression   . Hypertension   . Hypothyroidism     There are no active problems to display for this patient.   Past Surgical History:  Procedure Laterality Date  . ABDOMINAL HYSTERECTOMY    . appendectomy    . MINOR HEMORRHOIDECTOMY    . OPEN REDUCTION INTERNAL FIXATION (ORIF) DISTAL RADIAL FRACTURE Left 09/11/2015   Procedure: OPEN REDUCTION INTERNAL FIXATION (ORIF) DISTAL RADIAL FRACTURE;  Surgeon: Kennedy Bucker, MD;  Location: [facility] ORS;  Service: Orthopedics;  Laterality: Left;  . TONSILLECTOMY    . TUBAL LIGATION      Allergies Patient has no known allergies.  Social History Social History   Tobacco Use  . Smoking status: Never Smoker  Substance Use Topics  . Alcohol use: Not on file  . Drug use: Not on file   Review of Systems Unknown at this time  All systems negative/normal/unremarkable except as stated in the HPI  ____________________________________________   PHYSICAL EXAM:  VITAL SIGNS: ED Triage Vitals  Enc Vitals Group     BP      Pulse      Resp      Temp      Temp src      SpO2      Weight      Height      Head Circumference      Peak Flow      Pain Score    Pain Loc      Pain Edu?      Excl. in GC?    Constitutional: Altered, no obvious distress Eyes: Conjunctivae are normal.  ENT   Head: Normocephalic and atraumatic.   Nose: No congestion/rhinnorhea.   Mouth/Throat: Mucous membranes are moist.   Neck: No stridor. Cardiovascular: Normal rate, regular rhythm. No murmurs, rubs, or gallops. Respiratory: Normal respiratory effort without tachypnea nor retractions. Breath sounds are clear and equal bilaterally. No wheezes/rales/rhonchi. Gastrointestinal: Normal bowel sounds Musculoskeletal:  No lower extremity tenderness nor edema. Neurologic: Patient is disoriented and somewhat agitated, does not follow commands.  Best GCS is 10.  E1, V4, and 5 Skin:  Skin is warm, dry and intact. No obvious bruising is noted Psychiatric: altered, cannot assess at this time ____________________________________________  EKG: Interpreted by me.  Sinus bradycardia with a rate of 49 bpm, normal PR interval, normal QRS size, normal QT  ____________________________________________  ED COURSE:  As part of my medical decision making, I reviewed the following data within the electronic MEDICAL RECORD NUMBER History obtained from family if available, nursing notes, old chart and ekg, as well as notes from prior ED visits. Patient presented for altered mental status, we will assess with labs and imaging as indicated at this time.  Procedures ____________________________________________   LABS (pertinent positives/negatives)  Labs Reviewed  CBC WITH DIFFERENTIAL/PLATELET - Abnormal; Notable for the following components:      Result Value   Neutro Abs 7.7 (*)    Lymphs Abs 0.7 (*)    All other components within normal limits  COMPREHENSIVE METABOLIC PANEL - Abnormal; Notable for the following components:   Sodium 133 (*)    Glucose, Bld 139 (*)    All other components within normal limits  URINALYSIS, COMPLETE (UACMP) WITH MICROSCOPIC - Abnormal;  Notable for the following components:   Color, Urine YELLOW (*)    APPearance CLEAR (*)    Hgb urine dipstick SMALL (*)    All other components within normal limits  CULTURE, BLOOD (ROUTINE X 2)  CULTURE, BLOOD (ROUTINE X 2)  TROPONIN I  CK  AMMONIA  LACTIC ACID, PLASMA  ETHANOL  URINE DRUG SCREEN, QUALITATIVE ([facility] ONLY)  ACETAMINOPHEN LEVEL  SALICYLATE LEVEL  CBG MONITORING, ED   CRITICAL CARE Performed by: [name]   Total critical care time: 30 minutes  Critical care time was exclusive of separately billable procedures and treating other patients.  Critical care was necessary to treat or prevent imminent or life-threatening deterioration.  Critical care was time spent personally by me on the following activities: development of treatment plan with patient and/or surrogate as well as nursing, discussions with consultants, evaluation of patient's response to treatment, examination of patient, obtaining history from patient or surrogate, ordering and performing treatments and interventions, ordering and review of laboratory studies, ordering and review of radiographic studies, pulse oximetry and re-evaluation of patient's condition.  RADIOLOGY Images were viewed by me  CT head Is unremarkable Chest x-ray is unremarkable ____________________________________________  DIFFERENTIAL DIAGNOSIS   CVA, intoxication, dehydration, electrolyte abnormality, renal failure, sepsis  FINAL ASSESSMENT AND PLAN  Altered mental status   Plan: The patient had presented for altered mental status. Patient's labs do not reveal any acute process. Patient's imaging is also negative.  Unclear etiology for her altered mental status.  We have given her fluids, she was placed on a bear hugger for mild hypothermia.  We gave Narcan to see if this improved her mental status.  She did have a good gag reflex and appears to be maintaining her airway.  She has unclear etiology for altered  mental status.  Certainly could have overmedicated herself or accidentally overdose, occult stroke is also a possibility.  She will require further testing.  I will discuss with the hospitalist for admission.   [name], MD   Note: This note was generated in part or whole with voice recognition software. Voice recognition is usually quite accurate but there are transcription errors that can and very often do occur. I apologize for any typographical errors that were not detected and corrected.     Emily Filbert, MD 04/19/18 [phone]

## 2018-04-19 NOTE — ED Notes (Addendum)
Admitting MD paged regarding patient's rectal temp 96.4 despite being on bare hugger since approx 0730. This RN spoke with Dr. Allena Katz regarding temperature, per Dr. Allena Katz patient is okay to go to 2A with continued bare hugger.

## 2018-04-19 NOTE — ED Notes (Signed)
Admitting MD at bedside with patient's son.

## 2018-04-19 NOTE — H&P (Signed)
[employer] - [place] at [facility]   PATIENT NAME: Jaime [name]    MR#:  [id number]  DATE OF BIRTH:  1932-09-01  DATE OF ADMISSION:  04/19/2018  PRIMARY CARE PHYSICIAN: Mickey Farber, MD    REQUESTING/REFERRING PHYSICIAN: Emily Filbert, MD  CHIEF COMPLAINT:   Chief Complaint  Patient presents with  . Altered Mental Status    HISTORY OF PRESENT ILLNESS: Jaime [name]  is a 84 y.o. female with a known history of anxiety, hypertension, hypothyroidism who is brought to the emergency room with altered mental status.  I discussed the case with patient's daughter and son who states that she was doing fairly well up until yesterday evening.  She does have some issues with memory.  Normally able to ambulate around the house.  She does have a child with some health issues that she helps takes care of.  Patient today was noted to be found on the floor and apparently was there for many hours.  No further details of this is available.  In the ER patient's work-up so far is negative except she has hypothermia.  Patient currently is in a warming blanket she is able to be stimulated she falls back asleep.  PAST MEDICAL HISTORY:   Past Medical History:  Diagnosis Date  . Anxiety   . Depression   . Hypertension   . Hypothyroidism     PAST SURGICAL HISTORY:  Past Surgical History:  Procedure Laterality Date  . ABDOMINAL HYSTERECTOMY    . appendectomy    . MINOR HEMORRHOIDECTOMY    . OPEN REDUCTION INTERNAL FIXATION (ORIF) DISTAL RADIAL FRACTURE Left 09/11/2015   Procedure: OPEN REDUCTION INTERNAL FIXATION (ORIF) DISTAL RADIAL FRACTURE;  Surgeon: Kennedy Bucker, MD;  Location: [facility] ORS;  Service: Orthopedics;  Laterality: Left;  . TONSILLECTOMY    . TUBAL LIGATION      SOCIAL HISTORY:  Social History   Tobacco Use  . Smoking status: Never Smoker  . Smokeless tobacco: Never Used  Substance Use Topics  . Alcohol use: Not Currently    FAMILY HISTORY:  Family History   Problem Relation Age of Onset  . Breast cancer Maternal Aunt        2 mat aunts    DRUG ALLERGIES: No Known Allergies  REVIEW OF SYSTEMS:   CONSTITUTIONAL: Unable to provide due to her mental status MEDICATIONS AT HOME:  Prior to Admission medications   Medication Sig Start Date End Date Taking? Authorizing Provider  cyanocobalamin 1000 MCG tablet Take 1,000 mcg by mouth daily.    Yes [provider]  donepezil (ARICEPT) 5 MG tablet Take 5 mg by mouth every evening. 08/13/17 08/13/18 Yes [provider]  levothyroxine (SYNTHROID, LEVOTHROID) 137 MCG tablet Take 137 mcg by mouth daily before breakfast.   Yes [provider]  telmisartan (MICARDIS) 80 MG tablet Take 80 mg by mouth daily.   Yes [provider]      PHYSICAL EXAMINATION:   VITAL SIGNS: Blood pressure (!) 101/43, pulse (!) 49, temperature (!) 95.8 F (35.4 C), temperature source Rectal, resp. rate 16, height 5\' 5"  (1.651 m), weight 68 kg, SpO2 98 %.  GENERAL:  84 y.o.-year-old patient lying in the bed  EYES: Pupils equal, round, reactive to light and accommodation. No scleral icterus. Extraocular muscles intact.  HEENT: Head atraumatic, normocephalic. Oropharynx and nasopharynx clear.  NECK:  Supple, no jugular venous distention. No thyroid enlargement, no tenderness.  LUNGS: Normal breath sounds bilaterally, no wheezing, rales,rhonchi or  crepitation. No use of accessory muscles of respiration.  CARDIOVASCULAR: S1, S2 normal. No murmurs, rubs, or gallops.  ABDOMEN: Soft, nontender, nondistended. Bowel sounds present. No organomegaly or mass.  EXTREMITIES: No pedal edema, cyanosis, or clubbing.  NEUROLOGIC: Unresponsive PSYCHIATRIC: Unresponsive SKIN: No obvious rash, lesion, or ulcer.   LABORATORY PANEL:   CBC Recent Labs  Lab 04/19/18 0728  WBC 9.0  HGB 13.5  HCT 38.2  PLT 263  MCV 91.1  MCH 32.2  MCHC 35.3  RDW 13.5  LYMPHSABS 0.7*  MONOABS 0.5  EOSABS 0.0   BASOSABS 0.0   ------------------------------------------------------------------------------------------------------------------  Chemistries  Recent Labs  Lab 04/19/18 0728  NA 133*  K 4.5  CL 98  CO2 25  GLUCOSE 139*  BUN 15  CREATININE 0.57  CALCIUM 9.1  AST 22  ALT 12  ALKPHOS 86  BILITOT 0.5   ------------------------------------------------------------------------------------------------------------------ estimated creatinine clearance is 47.9 mL/min (by C-G formula based on SCr of 0.57 mg/dL). ------------------------------------------------------------------------------------------------------------------ No results for input(s): TSH, T4TOTAL, T3FREE, THYROIDAB in the last 72 hours.  Invalid input(s): FREET3   Coagulation profile No results for input(s): INR, PROTIME in the last 168 hours. ------------------------------------------------------------------------------------------------------------------- No results for input(s): DDIMER in the last 72 hours. -------------------------------------------------------------------------------------------------------------------  Cardiac Enzymes Recent Labs  Lab 04/19/18 0728  TROPONINI <0.03   ------------------------------------------------------------------------------------------------------------------ Invalid input(s): POCBNP  ---------------------------------------------------------------------------------------------------------------  Urinalysis    Component Value Date/Time   COLORURINE YELLOW (A) 04/19/2018 0728   APPEARANCEUR CLEAR (A) 04/19/2018 0728   LABSPEC 1.013 04/19/2018 0728   PHURINE 7.0 04/19/2018 0728   GLUCOSEU NEGATIVE 04/19/2018 0728   HGBUR SMALL (A) 04/19/2018 0728   BILIRUBINUR NEGATIVE 04/19/2018 0728   KETONESUR NEGATIVE 04/19/2018 0728   PROTEINUR NEGATIVE 04/19/2018 0728   NITRITE NEGATIVE 04/19/2018 0728   LEUKOCYTESUR NEGATIVE 04/19/2018 0728     RADIOLOGY: Dg Chest 1  View  Result Date: 04/19/2018 CLINICAL DATA:  Altered mental status. Patient found down this morning. EXAM: CHEST  1 VIEW COMPARISON:  None. FINDINGS: The lungs are clear. Heart size is normal. No pneumothorax or pleural effusion. Aortic atherosclerosis noted. No acute or focal bony abnormality. IMPRESSION: No acute disease. Electronically Signed   By: Drusilla Kanner M.D.   On: 04/19/2018 08:29   Ct Head Wo Contrast  Result Date: 04/19/2018 CLINICAL DATA:  Altered mental status. EXAM: CT HEAD WITHOUT CONTRAST TECHNIQUE: Contiguous axial images were obtained from the base of the skull through the vertex without intravenous contrast. COMPARISON:  None. FINDINGS: Brain: No evidence of acute infarction, hemorrhage, hydrocephalus, extra-axial collection or mass lesion/mass effect. Mild generalized cerebral atrophy, expected for age. Vascular: Atherosclerotic vascular calcification of the carotid siphons. No hyperdense vessel. Skull: Normal. Negative for fracture or focal lesion. Sinuses/Orbits: No acute finding. Other: None. IMPRESSION: 1. Normal for age noncontrast head CT. Electronically Signed   By: Obie Dredge M.D.   On: 04/19/2018 08:20    EKG: Orders placed or performed during the hospital encounter of 04/19/18  . EKG 12-Lead  . EKG 12-Lead    IMPRESSION AND PLAN: Patient is 84 year old white female found on the floor  1.  Acute encephalopathy etiology unclear Could be medication related Possible stroke No evidence of infection however she does have hypothermia I will check a sed rate, B12 level, RPR, HIV, TSH with thyroid panel Obtain MRI of the brain EEG when possible Neurology consultation  2.  Hypothermia Continue warming blanket supportive care  3.  Hypertension hold blood pressure medications  4.  Hypothyroidism hold Synthroid for  now we will check a TSH level  5.  Miscellaneous Lovenox for DVT prophylaxis    All the records are reviewed and case discussed with ED  provider. Management plans discussed with the patient, family and they are in agreement.  CODE STATUS: Code Status History    Date Active Date Inactive Code Status Order ID Comments User Context   09/11/2015 1421 09/11/2015 1746 Full Code [id number]  Kennedy Bucker, MD Inpatient       TOTAL TIME TAKING CARE OF THIS PATIENT:55 minutes.    Auburn Bilberry M.D on 04/19/2018 at 10:00 AM  Between 7am to 6pm - Pager - [phone]  After 6pm go to [email or web address] - password [email or web address]  [employer] Office  [phone]  CC: Primary care physician; Mickey Farber, MD

## 2018-04-19 NOTE — ED Notes (Signed)
Spoke with patient's daughter-in-law Jaime [name] via phone [phone]). She is currently out of town but is, with her husband, main caregiver for patient. She reports that patient has history of non-compliance with medications and cannot confirm with certainty that patient has been taking medications regularly of late. She provided PTA medication information.   Additionally, she inquired as to the patient's condition, to which I indicated that while she was not particularly responsive, she was breathing on her own and that she would be admitted to the hospital for further evaluation. No other details were divulged.  ** The above is intended solely for informational and/or communicative purposes. It should in no way be considered an endorsement of any specific treatment, therapy or action. **

## 2018-04-19 NOTE — ED Notes (Signed)
Pt desat to 78% on RA with good waveform. Pt's family at bedside. Pt placed on 2L via [place], pt's family reports that patient was snoring heavily prior to alarms going off. Dr. Mayford Knife to bedside to assess.

## 2018-04-19 NOTE — Progress Notes (Signed)
Pharmacy Antibiotic Note  Jaime [name] is a 84 y.o. female admitted on 04/19/2018 with AMS and hypohtermia.  Pharmacy has been consulted for Zosyn dosing.  Plan: Zosyn 3.375 g EI q 8 hours.   Height: 5\' 5"  (165.1 cm) Weight: 150 lb (68 kg) IBW/kg (Calculated) : 57  Temp (24hrs), Avg:95.8 F (35.4 C), Min:95.8 F (35.4 C), Max:95.8 F (35.4 C)  Recent Labs  Lab 04/19/18 0728 04/19/18 0729  WBC 9.0  --   CREATININE 0.57  --   LATICACIDVEN  --  1.0    Estimated Creatinine Clearance: 47.9 mL/min (by C-G formula based on SCr of 0.57 mg/dL).    No Known Allergies  Antimicrobials this admission: Zosyn 9/2 >>   Dose adjustments this admission:   Microbiology results: 9/2 BCx:   Thank you for allowing pharmacy to be a part of this patient's care.  Luisa Hart D 04/19/2018 10:36 AM

## 2018-04-19 NOTE — ED Notes (Signed)
Bare Hugger applied due to patient rectal temp 95.8.

## 2018-04-19 NOTE — Plan of Care (Signed)
  Problem: Clinical Measurements: Goal: Ability to maintain clinical measurements within normal limits will improve Outcome: Not Progressing Note:  Sodium level today is decreased at only 133. Patient has 5% Dextrose and 1/2 N.S. fluids running currently. Will continue to monitor lab values. Jari Favre [facility]

## 2018-04-20 ENCOUNTER — Inpatient Hospital Stay: Payer: Medicare Other

## 2018-04-20 DIAGNOSIS — G301 Alzheimer's disease with late onset: Secondary | ICD-10-CM

## 2018-04-20 DIAGNOSIS — F0391 Unspecified dementia with behavioral disturbance: Secondary | ICD-10-CM

## 2018-04-20 DIAGNOSIS — F03918 Unspecified dementia, unspecified severity, with other behavioral disturbance: Secondary | ICD-10-CM

## 2018-04-20 DIAGNOSIS — E43 Unspecified severe protein-calorie malnutrition: Secondary | ICD-10-CM

## 2018-04-20 DIAGNOSIS — F0281 Dementia in other diseases classified elsewhere with behavioral disturbance: Secondary | ICD-10-CM

## 2018-04-20 LAB — CBC
HCT: 31.8 % — ABNORMAL LOW (ref 35.0–47.0)
Hemoglobin: 11.3 g/dL — ABNORMAL LOW (ref 12.0–16.0)
MCH: 32.4 pg (ref 26.0–34.0)
MCHC: 35.6 g/dL (ref 32.0–36.0)
MCV: 91.2 fL (ref 80.0–100.0)
PLATELETS: 217 10*3/uL (ref 150–440)
RBC: 3.48 MIL/uL — ABNORMAL LOW (ref 3.80–5.20)
RDW: 13.3 % (ref 11.5–14.5)
WBC: 5.2 10*3/uL (ref 3.6–11.0)

## 2018-04-20 LAB — BASIC METABOLIC PANEL
Anion gap: 4 — ABNORMAL LOW (ref 5–15)
BUN: 8 mg/dL (ref 8–23)
CO2: 25 mmol/L (ref 22–32)
CREATININE: 0.61 mg/dL (ref 0.44–1.00)
Calcium: 8.2 mg/dL — ABNORMAL LOW (ref 8.9–10.3)
Chloride: 101 mmol/L (ref 98–111)
Glucose, Bld: 128 mg/dL — ABNORMAL HIGH (ref 70–99)
Potassium: 3.9 mmol/L (ref 3.5–5.1)
SODIUM: 130 mmol/L — AB (ref 135–145)

## 2018-04-20 LAB — PROCALCITONIN

## 2018-04-20 MED ORDER — ADULT MULTIVITAMIN W/MINERALS CH
1.0000 | ORAL_TABLET | Freq: Every day | ORAL | Status: DC
  Administered 2018-04-20 – 2018-04-21 (×2): 1 via ORAL
  Filled 2018-04-20 (×2): qty 1

## 2018-04-20 MED ORDER — ENSURE ENLIVE PO LIQD
237.0000 mL | Freq: Two times a day (BID) | ORAL | Status: DC
  Administered 2018-04-21: 237 mL via ORAL

## 2018-04-20 NOTE — Consult Note (Signed)
[facility] Face-to-Face Psychiatry Consult   Reason for Consult: Consult for this 84 year old woman in the hospital with acute mental status change.  Question about capacity. Referring Physician: Vianne Bulls Patient Identification: Jaime [name] MRN:  [id number] Principal Diagnosis: Dementia with behavioral disturbance Diagnosis:   Patient Active Problem List   Diagnosis Date Noted  . Protein-calorie malnutrition, severe [E43] 04/20/2018  . Dementia with behavioral disturbance [F03.91] 04/20/2018  . Acute encephalopathy [G93.40] 04/19/2018    Total Time spent with patient: 1 hour  Subjective:   Jaime [name] is a 84 y.o. female patient admitted with "I live here".  HPI: Patient interviewed chart reviewed.  Patient's 2 adult children were present during the interview as well.  This is an 84 year old woman without any past psychiatric history admitted to the hospital with altered mental status apparently having been found down on her floor passed out for an unknown amount of time.  Question raised about capacity presumably in advance of possibly considering guardianship for change in living status.  Patient was agitated when she first came into the hospital and yesterday evening although when I came to see her tonight she was calm and appropriate.  Patient had no psychiatric complaints.  She was clearly disoriented although she was able to keep it together and behave well especially with her children in the room.  She did not know where she was and could not retain the information when I told her.  At times she believed that she was in the old house that she raised her children in at times in another house or in some other facility.  Did not know the correct year.  Could not tell me anything about any medical problems or any recent situation that brought her to the hospital.  Denied any mood symptoms denied depression did not report any suicidal thoughts.  Showed no evidence of actual  hallucinations.  Social history: Patient apparently has been living independently or semi-independently with her adult children checking in on her regularly.  From what I understand there may be another elderly person who lives with her at least part time as well.  She is a widow.  Maintains good relationships with her family.  Medical history: Diagnosis of malnutrition here in the hospital.  High blood pressure.  Reportedly history of thyroid disease.  Substance abuse history: Denies alcohol or drug abuse current or past  Past Psychiatric History: No known past psychiatric history no history of hospitalization or psychiatric treatment.  No history of depression.  No history of suicidal or homicidal behavior.  Risk to Self:   Risk to Others:   Prior Inpatient Therapy:   Prior Outpatient Therapy:    Past Medical History:  Past Medical History:  Diagnosis Date  . Anxiety   . Depression   . Hypertension   . Hypothyroidism     Past Surgical History:  Procedure Laterality Date  . ABDOMINAL HYSTERECTOMY    . appendectomy    . MINOR HEMORRHOIDECTOMY    . OPEN REDUCTION INTERNAL FIXATION (ORIF) DISTAL RADIAL FRACTURE Left 09/11/2015   Procedure: OPEN REDUCTION INTERNAL FIXATION (ORIF) DISTAL RADIAL FRACTURE;  Surgeon: Hessie Knows, MD;  Location: [facility] ORS;  Service: Orthopedics;  Laterality: Left;  . TONSILLECTOMY    . TUBAL LIGATION     Family History:  Family History  Problem Relation Age of Onset  . Breast cancer Maternal Aunt        2 mat aunts   Family Psychiatric  History: None known Social  History:  Social History   Substance and Sexual Activity  Alcohol Use Not Currently     Social History   Substance and Sexual Activity  Drug Use Never    Social History   Socioeconomic History  . Marital status: Widowed    Spouse name: Not on file  . Number of children: Not on file  . Years of education: Not on file  . Highest education level: Not on file  Occupational  History  . Not on file  Social Needs  . Financial resource strain: Not on file  . Food insecurity:    Worry: Not on file    Inability: Not on file  . Transportation needs:    Medical: Not on file    Non-medical: Not on file  Tobacco Use  . Smoking status: Never Smoker  . Smokeless tobacco: Never Used  Substance and Sexual Activity  . Alcohol use: Not Currently  . Drug use: Never  . Sexual activity: Not on file  Lifestyle  . Physical activity:    Days per week: Not on file    Minutes per session: Not on file  . Stress: Not on file  Relationships  . Social connections:    Talks on phone: Not on file    Gets together: Not on file    Attends religious service: Not on file    Active member of club or organization: Not on file    Attends meetings of clubs or organizations: Not on file    Relationship status: Not on file  Other Topics Concern  . Not on file  Social History Narrative  . Not on file   Additional Social History:    Allergies:  No Known Allergies  Labs:  Results for orders placed or performed during the hospital encounter of 04/19/18 (from the past 48 hour(s))  CBC with Differential     Status: Abnormal   Collection Time: 04/19/18  7:28 AM  Result Value Ref Range   WBC 9.0 3.6 - 11.0 K/uL   RBC 4.20 3.80 - 5.20 MIL/uL   Hemoglobin 13.5 12.0 - 16.0 g/dL   HCT 38.2 35.0 - 47.0 %   MCV 91.1 80.0 - 100.0 fL   MCH 32.2 26.0 - 34.0 pg   MCHC 35.3 32.0 - 36.0 g/dL   RDW 13.5 11.5 - 14.5 %   Platelets 263 150 - 440 K/uL   Neutrophils Relative % 86 %   Neutro Abs 7.7 (H) 1.4 - 6.5 K/uL   Lymphocytes Relative 8 %   Lymphs Abs 0.7 (L) 1.0 - 3.6 K/uL   Monocytes Relative 6 %   Monocytes Absolute 0.5 0.2 - 0.9 K/uL   Eosinophils Relative 0 %   Eosinophils Absolute 0.0 0 - 0.7 K/uL   Basophils Relative 0 %   Basophils Absolute 0.0 0 - 0.1 K/uL    Comment: Performed at [facility], [address] [place], [place] [address]  Comprehensive metabolic  panel     Status: Abnormal   Collection Time: 04/19/18  7:28 AM  Result Value Ref Range   Sodium 133 (L) 135 - 145 mmol/L   Potassium 4.5 3.5 - 5.1 mmol/L   Chloride 98 98 - 111 mmol/L   CO2 25 22 - 32 mmol/L   Glucose, Bld 139 (H) 70 - 99 mg/dL   BUN 15 8 - 23 mg/dL   Creatinine, Ser 0.57 0.44 - 1.00 mg/dL   Calcium 9.1 8.9 - 10.3 mg/dL   Total  Protein 6.7 6.5 - 8.1 g/dL   Albumin 3.8 3.5 - 5.0 g/dL   AST 22 15 - 41 U/L   ALT 12 0 - 44 U/L   Alkaline Phosphatase 86 38 - 126 U/L   Total Bilirubin 0.5 0.3 - 1.2 mg/dL   GFR calc non Af Amer >60 >60 mL/min   GFR calc Af Amer >60 >60 mL/min    Comment: (NOTE) The eGFR has been calculated using the CKD EPI equation. This calculation has not been validated in all clinical situations. eGFR's persistently <60 mL/min signify possible Chronic Kidney Disease.    Anion gap 10 5 - 15    Comment: Performed at [facility], [address] [place], [place] [address]  Troponin I     Status: None   Collection Time: 04/19/18  7:28 AM  Result Value Ref Range   Troponin I <0.03 <0.03 ng/mL    Comment: Performed at [facility], [address] [place], [place] [address]  Urinalysis, Complete w Microscopic     Status: Abnormal   Collection Time: 04/19/18  7:28 AM  Result Value Ref Range   Color, Urine YELLOW (A) YELLOW   APPearance CLEAR (A) CLEAR   Specific Gravity, Urine 1.013 1.005 - 1.030   pH 7.0 5.0 - 8.0   Glucose, UA NEGATIVE NEGATIVE mg/dL   Hgb urine dipstick SMALL (A) NEGATIVE   Bilirubin Urine NEGATIVE NEGATIVE   Ketones, ur NEGATIVE NEGATIVE mg/dL   Protein, ur NEGATIVE NEGATIVE mg/dL   Nitrite NEGATIVE NEGATIVE   Leukocytes, UA NEGATIVE NEGATIVE   RBC / HPF 0-5 0 - 5 RBC/hpf   WBC, UA 0-5 0 - 5 WBC/hpf   Bacteria, UA NONE SEEN NONE SEEN   Squamous Epithelial / LPF 0-5 0 - 5   Mucus PRESENT     Comment: Performed at [facility], [address] [place], [place] [address]  CK     Status: None    Collection Time: 04/19/18  7:28 AM  Result Value Ref Range   Total CK 152 38 - 234 U/L    Comment: Performed at [facility], [address] [place], [place] [address]  Ammonia     Status: None   Collection Time: 04/19/18  7:28 AM  Result Value Ref Range   Ammonia 15 9 - 35 umol/L    Comment: Performed at [facility], [address] [place], [place] [address]  Urine Drug Screen, Qualitative ([facility] only)     Status: Abnormal   Collection Time: 04/19/18  7:28 AM  Result Value Ref Range   Tricyclic, Ur Screen NONE DETECTED NONE DETECTED   Amphetamines, Ur Screen NONE DETECTED NONE DETECTED   MDMA (Ecstasy)Ur Screen NONE DETECTED NONE DETECTED   Cocaine Metabolite,Ur [place] NONE DETECTED NONE DETECTED   Opiate, Ur Screen NONE DETECTED NONE DETECTED   Phencyclidine (PCP) Ur S NONE DETECTED NONE DETECTED   Cannabinoid 50 Ng, Ur [place] NONE DETECTED NONE DETECTED   Barbiturates, Ur Screen NONE DETECTED NONE DETECTED   Benzodiazepine, Ur Scrn TEST NOT PERFORMED, REAGENT NOT AVAILABLE (A) NONE DETECTED   Methadone Scn, Ur NONE DETECTED NONE DETECTED    Comment: (NOTE) Tricyclics + metabolites, urine    Cutoff 1000 ng/mL Amphetamines + metabolites, urine  Cutoff 1000 ng/mL MDMA (Ecstasy), urine              Cutoff 500 ng/mL Cocaine Metabolite, urine          Cutoff 300  ng/mL Opiate + metabolites, urine        Cutoff 300 ng/mL Phencyclidine (PCP), urine         Cutoff 25 ng/mL Cannabinoid, urine                 Cutoff 50 ng/mL Barbiturates + metabolites, urine  Cutoff 200 ng/mL Benzodiazepine, urine              Cutoff 200 ng/mL Methadone, urine                   Cutoff 300 ng/mL The urine drug screen provides only a preliminary, unconfirmed analytical test result and should not be used for non-medical purposes. Clinical consideration and professional judgment should be applied to any positive drug screen result due to possible interfering substances. A more specific  alternate chemical method must be used in order to obtain a confirmed analytical result. Gas chromatography / mass spectrometry (GC/MS) is the preferred confirmat ory method. Performed at [facility], [address] [place], [place] [address]   Blood culture (routine x 2)     Status: None (Preliminary result)   Collection Time: 04/19/18  7:29 AM  Result Value Ref Range   Specimen Description BLOOD    Special Requests NONE    Culture      NO GROWTH < 24 HOURS Performed at [facility], [address] [place], [place] [address]    Report Status PENDING   Blood culture (routine x 2)     Status: None (Preliminary result)   Collection Time: 04/19/18  7:29 AM  Result Value Ref Range   Specimen Description BLOOD    Special Requests NONE    Culture      NO GROWTH < 24 HOURS Performed at [facility], [address], [place], [place] [address]    Report Status PENDING   Lactic acid, plasma     Status: None   Collection Time: 04/19/18  7:29 AM  Result Value Ref Range   Lactic Acid, Venous 1.0 0.5 - 1.9 mmol/L    Comment: Performed at [facility], [address] [place], [place] [address]  Ethanol     Status: None   Collection Time: 04/19/18  9:17 AM  Result Value Ref Range   Alcohol, Ethyl (B) <10 <10 mg/dL    Comment: (NOTE) Lowest detectable limit for serum alcohol is 10 mg/dL. For medical purposes only. Performed at [facility], [address] [place], [place] [address]   Acetaminophen level     Status: Abnormal   Collection Time: 04/19/18  9:17 AM  Result Value Ref Range   Acetaminophen (Tylenol), Serum <10 (L) 10 - 30 ug/mL    Comment: (NOTE) Therapeutic concentrations vary significantly. A range of 10-30 ug/mL  may be an effective concentration for many patients. However, some  are best treated at concentrations outside of this range. Acetaminophen concentrations >150 ug/mL at 4 hours after ingestion  and >50  ug/mL at 12 hours after ingestion are often associated with  toxic reactions. Performed at [facility], [address] [place], [place] [address]   Salicylate level     Status: None   Collection Time: 04/19/18  9:17 AM  Result Value Ref Range   Salicylate Lvl <3.5 2.8 - 30.0 mg/dL    Comment: Performed at [facility], [address], [place], [place] [address]  Vitamin B12     Status: None   Collection Time: 04/19/18  11:42 AM  Result Value Ref Range   Vitamin B-12 223 180 - 914 pg/mL    Comment: (NOTE) This assay is not validated for testing neonatal or myeloproliferative syndrome specimens for Vitamin B12 levels. Performed at [facility], [address], [place], [place] [address]   Sedimentation rate     Status: None   Collection Time: 04/19/18 11:42 AM  Result Value Ref Range   Sed Rate 20 0 - 30 mm/hr    Comment: Performed at [facility], [address] [place], [place] [address]  Blood gas, arterial     Status: None   Collection Time: 04/19/18 11:51 AM  Result Value Ref Range   FIO2 0.28    Delivery systems NASAL CANNULA    pH, Arterial 7.42 7.350 - 7.450   pCO2 arterial 37 32.0 - 48.0 mmHg   pO2, Arterial 108 83.0 - 108.0 mmHg   Bicarbonate 24.0 20.0 - 28.0 mmol/L   Acid-base deficit 0.3 0.0 - 2.0 mmol/L   O2 Saturation 98.3 %   Patient temperature 37.0    Collection site REVIEWED BY    Sample type ARTERIAL DRAW    Allens test (pass/fail) PASS PASS    Comment: Performed at [facility], [address] [place], [place] [address]  CBC     Status: Abnormal   Collection Time: 04/20/18  2:55 AM  Result Value Ref Range   WBC 5.2 3.6 - 11.0 K/uL   RBC 3.48 (L) 3.80 - 5.20 MIL/uL   Hemoglobin 11.3 (L) 12.0 - 16.0 g/dL   HCT 31.8 (L) 35.0 - 47.0 %   MCV 91.2 80.0 - 100.0 fL   MCH 32.4 26.0 - 34.0 pg   MCHC 35.6 32.0 - 36.0 g/dL   RDW 13.3 11.5 - 14.5 %   Platelets 217 150 - 440 K/uL    Comment: Performed at  [facility], [address] [place], [place] [address]  Basic metabolic panel     Status: Abnormal   Collection Time: 04/20/18  2:55 AM  Result Value Ref Range   Sodium 130 (L) 135 - 145 mmol/L   Potassium 3.9 3.5 - 5.1 mmol/L   Chloride 101 98 - 111 mmol/L   CO2 25 22 - 32 mmol/L   Glucose, Bld 128 (H) 70 - 99 mg/dL   BUN 8 8 - 23 mg/dL   Creatinine, Ser 0.61 0.44 - 1.00 mg/dL   Calcium 8.2 (L) 8.9 - 10.3 mg/dL   GFR calc non Af Amer >60 >60 mL/min   GFR calc Af Amer >60 >60 mL/min    Comment: (NOTE) The eGFR has been calculated using the CKD EPI equation. This calculation has not been validated in all clinical situations. eGFR's persistently <60 mL/min signify possible Chronic Kidney Disease.    Anion gap 4 (L) 5 - 15    Comment: Performed at [facility], [address] [place], [place] [address]  Procalcitonin - Baseline     Status: None   Collection Time: 04/20/18  2:46 PM  Result Value Ref Range   Procalcitonin <0.10 ng/mL    Comment:        Interpretation: PCT (Procalcitonin) <= 0.5 ng/mL: Systemic infection (sepsis) is not likely. Local bacterial infection is possible. (NOTE)       Sepsis PCT Algorithm           Lower Respiratory Tract  Infection PCT Algorithm    ----------------------------     ----------------------------         PCT < 0.25 ng/mL                PCT < 0.10 ng/mL         Strongly encourage             Strongly discourage   discontinuation of antibiotics    initiation of antibiotics    ----------------------------     -----------------------------       PCT 0.25 - 0.50 ng/mL            PCT 0.10 - 0.25 ng/mL               OR       >80% decrease in PCT            Discourage initiation of                                            antibiotics      Encourage discontinuation           of antibiotics    ----------------------------     -----------------------------         PCT >= 0.50 ng/mL               PCT 0.26 - 0.50 ng/mL               AND        <80% decrease in PCT             Encourage initiation of                                             antibiotics       Encourage continuation           of antibiotics    ----------------------------     -----------------------------        PCT >= 0.50 ng/mL                  PCT > 0.50 ng/mL               AND         increase in PCT                  Strongly encourage                                      initiation of antibiotics    Strongly encourage escalation           of antibiotics                                     -----------------------------                                           PCT <= 0.25 ng/mL  OR                                        > 80% decrease in PCT                                     Discontinue / Do not initiate                                             antibiotics Performed at [facility], [address] [place],  [address]     Current Facility-Administered Medications  Medication Dose Route Frequency Provider Last Rate Last Dose  . 0.9 %  sodium chloride infusion   Intravenous PRN Dustin Flock, MD 10 mL/hr at 04/19/18 2020 500 mL at 04/19/18 2020  . acetaminophen (TYLENOL) tablet 650 mg  650 mg Oral Q6H PRN Dustin Flock, MD   650 mg at 04/20/18 1501   Or  . acetaminophen (TYLENOL) suppository 650 mg  650 mg Rectal Q6H PRN Dustin Flock, MD      . dextrose 5 %-0.45 % sodium chloride infusion   Intravenous Continuous Dustin Flock, MD   Stopped at 04/20/18 1400  . enoxaparin (LOVENOX) injection 40 mg  40 mg Subcutaneous Q24H Dustin Flock, MD   40 mg at 04/20/18 1306  . [START ON 04/21/2018] feeding supplement (ENSURE ENLIVE) (ENSURE ENLIVE) liquid 237 mL  237 mL Oral BID BM Mayo, Pete Pelt, MD      . multivitamin with minerals tablet 1 tablet  1 tablet Oral Daily Mayo, Pete Pelt, MD   1 tablet at 04/20/18 1843  . ondansetron  (ZOFRAN) tablet 4 mg  4 mg Oral Q6H PRN Dustin Flock, MD       Or  . ondansetron (ZOFRAN) injection 4 mg  4 mg Intravenous Q6H PRN Dustin Flock, MD        Musculoskeletal: Strength & Muscle Tone: decreased Gait & Station: unsteady Patient leans: N/A  Psychiatric Specialty Exam: Physical Exam  Nursing note and vitals reviewed. Constitutional: She appears well-developed and well-nourished.  HENT:  Head: Normocephalic and atraumatic.  Eyes: Pupils are equal, round, and reactive to light. Conjunctivae are normal.  Neck: Normal range of motion.  Cardiovascular: Regular rhythm and normal heart sounds.  Respiratory: Effort normal. No respiratory distress.  GI: Soft.  Musculoskeletal: Normal range of motion.  Neurological: She is alert.  Skin: Skin is warm and dry.  Psychiatric: She has a normal mood and affect. Her speech is normal. She is not agitated and not aggressive. Thought content is not paranoid. Cognition and memory are impaired. She expresses impulsivity and inappropriate judgment. She expresses no homicidal and no suicidal ideation. She exhibits abnormal recent memory.    Review of Systems  Unable to perform ROS: Dementia    Blood pressure (!) 143/60, pulse 65, temperature 98.4 F (36.9 C), temperature source Oral, resp. rate 16, height '5\' 5"'  (1.651 m), weight 62.5 kg, SpO2 97 %.Body mass index is 22.91 kg/m.  General Appearance: Fairly Groomed  Eye Contact:  Good  Speech:  Clear and Coherent  Volume:  Normal  Mood:  Euthymic  Affect:  Congruent and Inappropriate  Thought Process:  Disorganized  Orientation:  Negative  Thought Content:  Illogical, Rumination and Tangential  Suicidal Thoughts:  No  Homicidal Thoughts:  No  Memory:  Immediate;   Fair Recent;   Poor Remote;   Fair  Judgement:  Impaired  Insight:  Lacking  Psychomotor Activity:  Normal  Concentration:  Concentration: Poor  Recall:  Poor  Fund of Knowledge:  Fair  Language:  Good  Akathisia:   No  Handed:  Right  AIMS (if indicated):     Assets:  Desire for Improvement Housing Social Support  ADL's:  Impaired  Cognition:  Impaired,  Mild  Sleep:        Treatment Plan Summary: Plan 84 year old woman currently presents as demented.  No idea why she is in the hospital.  Short-term memory very poor.  Patient currently lacks capacity to make appropriate decisions given her lack of ability to even understand the basic situation around her.  No indication however for psychiatric hospitalization no indication of dangerousness to herself other than her limited ability at self-care.  Case reviewed with case management.  No indication for psychiatric intervention or treatment.  I will follow if needed.  Disposition: No evidence of imminent risk to self or others at present.    Alethia Berthold, MD 04/20/2018 7:08 PM

## 2018-04-20 NOTE — Evaluation (Signed)
Physical Therapy Evaluation Patient Details Name: Jaime [name] MRN: [id number] DOB: [birth year] Today's Date: 04/20/2018   History of Present Illness  84 y.o. female with a known history of anxiety, hypertension, hypothyroidism who is brought to the emergency room with altered mental status.  Normally she is able to ambulate ad lib w/o AD but does have some memory issues. Family found her on the floor, appears she was there for hours.  Clinical Impression  Pt confused t/o session and perseverating on needing to get ready for a date with her boyfriend in an our.  She ultimately showed good overall physical ability with appropriate strength, balance, ambulation, etc but was impulsive and showed poor ability to consistently follow instructions or make appropriate safety decisions.  She needed constant re-direction and cuing to stay on task and generally to stay safe.  Regarding PT she is at age appropriate levels and near her baseline, however safety awareness/cognition dictate that she needs 24/7 supervision.     Follow Up Recommendations No PT follow up;Supervision/Assistance - 24 hour(will maintain on caseload in hospital to remain active)    Equipment Recommendations  None recommended by PT    Recommendations for Other Services       Precautions / Restrictions Precautions Precautions: Fall Restrictions Weight Bearing Restrictions: No      Mobility  Bed Mobility Overal bed mobility: Independent                Transfers Overall transfer level: Independent Equipment used: None             General transfer comment: Pt easily gets to standing and maintains balance w/o assist  Ambulation/Gait Ambulation/Gait assistance: Supervision Gait Distance (Feet): 250 Feet Assistive device: None       General Gait Details: Pt was able to maintain consistent speed and cadence when she was able to stay on task.  She had somewhat stooped posture and was impulsively trying  to enter other rooms and needing to be constantly re-directed   Stairs            Wheelchair Mobility    Modified Rankin (Stroke Patients Only)       Balance Overall balance assessment: Independent                                           Pertinent Vitals/Pain Pain Assessment: No/denies pain    Home Living Family/patient expects to be discharged to:: Unsure Living Arrangements: Children               Additional Comments: 3 story home that pt is normally able to phyiscally negotiate but apparently confusion has become more of an issue recently.    Prior Function Level of Independence: Independent         Comments: Pt does not drive secondary to awareness issues but is out of the home daily      Hand Dominance        Extremity/Trunk Assessment   Upper Extremity Assessment Upper Extremity Assessment: Overall WFL for tasks assessed    Lower Extremity Assessment Lower Extremity Assessment: Overall WFL for tasks assessed       Communication   Communication: (pt confused and at times vaguely "agitated")  Cognition Arousal/Alertness: Awake/alert Behavior During Therapy: Impulsive;Restless Overall Cognitive Status: Impaired/Different from baseline Area of Impairment: Orientation;Safety/judgement;Awareness  General Comments: Per daughter pt has baseline confusion, now more limited than normal      General Comments      Exercises     Assessment/Plan    PT Assessment Patient needs continued PT services  PT Problem List Decreased balance;Decreased cognition;Decreased safety awareness       PT Treatment Interventions Stair training;Therapeutic activities;Therapeutic exercise;Balance training;Neuromuscular re-education;Patient/family education;Gait training    PT Goals (Current goals can be found in the Care Plan section)  Acute Rehab PT Goals Patient Stated Goal: figure out increased  assistance for mom at home PT Goal Formulation: With family Time For Goal Achievement: 05/04/18 Potential to Achieve Goals: Fair    Frequency Min 2X/week   Barriers to discharge        Co-evaluation               AM-PAC PT "6 Clicks" Daily Activity  Outcome Measure Difficulty turning over in bed (including adjusting bedclothes, sheets and blankets)?: None Difficulty moving from lying on back to sitting on the side of the bed? : None Difficulty sitting down on and standing up from a chair with arms (e.g., wheelchair, bedside commode, etc,.)?: None Help needed moving to and from a bed to chair (including a wheelchair)?: None Help needed walking in hospital room?: None Help needed climbing 3-5 steps with a railing? : None 6 Click Score: 24    End of Session Equipment Utilized During Treatment: Gait belt Activity Tolerance: Patient tolerated treatment well Patient left: with chair alarm set;with call bell/phone within reach   PT Visit Diagnosis: Muscle weakness (generalized) (M62.81);Unsteadiness on feet (R26.81)    Time: 1550-1605 PT Time Calculation (min) (ACUTE ONLY): 15 min   Charges:   PT Evaluation $PT Eval Low Complexity: 1 Low          Malachi Pro, DPT 04/20/2018, 5:02 PM

## 2018-04-20 NOTE — Progress Notes (Signed)
[facility] [place] NAME: Jaime [name]    MR#:  [id number]  DATE OF BIRTH:  08/24/1932  SUBJECTIVE:  States she is doing fine today. No concerns. Family feels that she is less confused today.  REVIEW OF SYSTEMS:  Review of Systems  Constitutional: Negative for chills and fever.  HENT: Negative for congestion and sore throat.   Eyes: Negative for blurred vision and double vision.  Respiratory: Negative for cough and shortness of breath.   Cardiovascular: Negative for chest pain, palpitations and leg swelling.  Gastrointestinal: Negative for abdominal pain, nausea and vomiting.  Genitourinary: Negative for dysuria and frequency.  Musculoskeletal: Negative for back pain and neck pain.  Neurological: Negative for dizziness and headaches.  Psychiatric/Behavioral: Negative for depression. The patient is not nervous/anxious.     DRUG ALLERGIES:  No Known Allergies VITALS:  Blood pressure (!) 143/60, pulse 65, temperature 98.4 F (36.9 C), temperature source Oral, resp. rate 16, height _0  (1.651 m), weight 62.5 kg, SpO2 97 %. PHYSICAL EXAMINATION:  Physical Exam  GENERAL:  84 y.o.-year-old patient lying in the bed, in NAD, pleasant EYES: Pupils equal, round, reactive to light and accommodation. No scleral icterus. Extraocular muscles intact.  HEENT: Head atraumatic, normocephalic. Oropharynx and nasopharynx clear.  NECK:  Supple, no jugular venous distention. No thyroid enlargement, no tenderness.  LUNGS: Normal breath sounds bilaterally, no wheezing, rales,rhonchi or crepitation. No use of accessory muscles of respiration.  CARDIOVASCULAR: RRR, S1, S2 normal. No murmurs, rubs, or gallops.  ABDOMEN: Soft, nontender, nondistended. Bowel sounds present. No organomegaly or mass.  EXTREMITIES: No pedal edema, cyanosis, or clubbing.  NEUROLOGIC: CN 2-12 intact, +global weakness, no focal deficits. PSYCHIATRIC: alert and oriented to person  and place. SKIN: No obvious rash, lesion, or ulcer.  LABORATORY PANEL:  Female CBC Recent Labs  Lab 04/20/18 0255  WBC 5.2  HGB 11.3*  HCT 31.8*  PLT 217   ------------------------------------------------------------------------------------------------------------------ Chemistries  Recent Labs  Lab 04/19/18 0728 04/20/18 0255  NA 133* 130*  K 4.5 3.9  CL 98 101  CO2 25 25  GLUCOSE 139* 128*  BUN 15 8  CREATININE 0.57 0.61  CALCIUM 9.1 8.2*  AST 22  --   ALT 12  --   ALKPHOS 86  --   BILITOT 0.5  --    RADIOLOGY:  Mr Brain Wo Contrast  Result Date: 04/20/2018 CLINICAL DATA:  Altered mental status, found down today. History of hypertension. EXAM: MRI HEAD WITHOUT CONTRAST TECHNIQUE: Multiplanar, multiecho pulse sequences of the brain and surrounding structures were obtained without intravenous contrast. COMPARISON:  CT HEAD April 19, 2018 FINDINGS: Mild motion degraded examination. INTRACRANIAL CONTENTS: No reduced diffusion to suggest acute ischemia. No susceptibility artifact to suggest hemorrhage. The ventricles and sulci are normal for patient's age. Faint supratentorial white matter FLAIR T2 hyperintensities compatible with mild chronic small vessel ischemic changes, less than expected for age. No suspicious parenchymal signal, masses, mass effect. No abnormal extra-axial fluid collections. No extra-axial masses. VASCULAR: Normal major intracranial vascular flow voids present at skull base. SKULL AND UPPER CERVICAL SPINE: No abnormal sellar expansion. No suspicious calvarial bone marrow signal. Craniocervical junction maintained. SINUSES/ORBITS: The mastoid air-cells and included paranasal sinuses are well-aerated. The included ocular globes and orbital contents are non-suspicious. OTHER: None. IMPRESSION: Negative motion degraded noncontrast MRI head for age. Electronically Signed   By: Elon Alas M.D.   On: 04/20/2018 01:09   ASSESSMENT AND PLAN:  Acute  encephalopathy- improved this morning. May be related to taking her son's medications by accident. Stroke ruled out with negative MRI. - EEG pending - neurology consulted - ESR, B12, HIV unremarkable - RPR, TSH pending - PT consulted- no PT f/u recommended  Hypothermia- resolved. Unlikely due to sepsis without any other infectious signs. procalcitonin <0.10. - stop zosyn - continue supportive care  Hypertension- normotensive - hold blood pressure medications  Hypothyroidism- stable - TSH pending - holding synthroid for now  All the records are reviewed and case discussed with Care Management/Social Worker. Management plans discussed with the patient, family and they are in agreement.  CODE STATUS: Full Code  TOTAL TIME TAKING CARE OF THIS PATIENT: 33 minutes.   More than 50% of the time was spent in counseling/coordination of care: YES  POSSIBLE D/C IN 1-2 DAYS, DEPENDING ON CLINICAL CONDITION.   Berna Spare Gaspar Fowle M.D on 04/20/2018 at 5:38 PM  Between 7am to 6pm - Pager - [phone]  After 6pm go to [email or web address] - Technical brewer [facility]  Office  [phone]  CC: Primary care physician; Ezequiel Kayser, MD  Note: This dictation was prepared with Dragon dictation along with smaller phrase technology. Any transcriptional errors that result from this process are unintentional.

## 2018-04-20 NOTE — Progress Notes (Addendum)
Pt pulled her heart monitor  IV and does not want to be on IV fluids at start of shift.   Update 2101: Pt did agrred to place her heart monitor and D 5% 0.45 normal saline fluids. Will continue to monitor.  Update 0641: Pt WBC 3.2. Prime made aware. Will continue to monitor.

## 2018-04-20 NOTE — Plan of Care (Signed)

## 2018-04-20 NOTE — Progress Notes (Signed)
Initial Nutrition Assessment  DOCUMENTATION CODES:   Severe malnutrition in context of chronic illness  INTERVENTION:   Ensure Enlive po BID, each supplement provides 350 kcal and 20 grams of protein  Magic cup TID with meals, each supplement provides 290 kcal and 9 grams of protein  MVI daily   Liberalize diet  Pt at moderate refeeding risk; recommend monitor K, Mg and P labs   NUTRITION DIAGNOSIS:   Severe Malnutrition related to chronic illness(chronic poor appetite, advanced age ) as evidenced by 16 percent weight loss in 8 months, moderate to severe muscle depletions.  GOAL:   Patient will meet greater than or equal to 90% of their needs  MONITOR:   PO intake, Labs, Weight trends, Supplement acceptance, Skin, I & O's  REASON FOR ASSESSMENT:   Malnutrition Screening Tool    ASSESSMENT:   84 y.o. female with a known history of anxiety, hypertension, hypothyroidism who is brought to the emergency room with altered mental status.    Met with pt in room today. Pt is a poor historian so majority of history obtained from pt's daughter at bedside. Per pt's daughter, pt with poor appetite and oral intake for 5-6 months pta. Daughter reports that pt will go out to [employer] and bring most of her food home. Pt does not drink any supplements at home but she does enjoy peanut butter crackers and whole milk. Daughter does report that pt has lost weight but she is unsure as to how much. Per chart, pt has lost 26lbs(16%) in 8 months; this is significant. Pt denies any trouble chewing or swallowing. RD will order supplements to help pt meet her estimated needs. Pt prefers strawberry and orange flavors. Pt likely at moderate refeeding risk; recommend K, Mg and P labs.   Medications reviewed and include: lovenox, NaCl w/ 5% dextrose '@75ml'$ /hr, zosyn  Labs reviewed: Na 130(L), K 3.9 wnl  NUTRITION - FOCUSED PHYSICAL EXAM:    Most Recent Value  Orbital Region  No depletion  Upper  Arm Region  No depletion  Thoracic and Lumbar Region  No depletion  Buccal Region  No depletion  Temple Region  Mild depletion  Clavicle Bone Region  Severe depletion  Clavicle and Acromion Bone Region  Severe depletion  Scapular Bone Region  Moderate depletion  Dorsal Hand  Moderate depletion  Patellar Region  Severe depletion  Anterior Thigh Region  Moderate depletion  Posterior Calf Region  Moderate depletion  Edema (RD Assessment)  None  Hair  Reviewed [thinning ]  Eyes  Reviewed  Mouth  Reviewed  Skin  Reviewed  Nails  Reviewed     Diet Order:   Diet Order            Diet Heart Room service appropriate? Yes; Fluid consistency: Thin  Diet effective now             EDUCATION NEEDS:   Education needs have been addressed(with pt's daughter )  Skin:  Skin Assessment: Reviewed RN Assessment(Ecchymosis )  Last BM:  PTA  Height:   Ht Readings from Last 1 Encounters:  04/19/18 '5\' 5"'$  (1.651 m)    Weight:   Wt Readings from Last 1 Encounters:  04/20/18 62.5 kg    Ideal Body Weight:  56.8 kg  BMI:  Body mass index is 22.91 kg/m.  Estimated Nutritional Needs:   Kcal:  1400-1600kcal/day   Protein:  75-85g/day   Fluid:  >1.4L/day   Koleen Distance MS, RD, LDN Pager #-  [phone] Office#- [phone] After Hours Pager: [phone]

## 2018-04-20 NOTE — Care Management Note (Signed)
Case Management Note  Patient Details  Name: Jaime [name] MRN: [id number] Date of Birth: [birth year]  Subjective/Objective:                 Patient presents from home with altered mental status. She was found down and had dry stool on her body.  Patient is very confused at present and easily agitated. Found documentation in Care Everywhere of   cognitive impairment 05/14/2017. Patient was started on Aricef 07/2017, Remeron 05/2017.   Spoke with patient's daughter Sheral Apley lives in [place] and is at bedside. There are no services in the home.    Patient's current mental status is not her baseline.  Family has "noticed" patient's memory and mental status has been on decline but symptoms seem worse over last 2 days -  especially with patient being out of her familiar environment.  Patient lives with her son Gala Romney)  who has schizophrenia and [name] says it is very difficult for patient and son to "not be together."  Neither one can remember when to take meds or "really take care of each other."  Patient also has a a boyfriend who "takes me on dates."  Patient has pulled out her ivs and very impulsive and getting out of bed.  Becomes argumentative when attempt to redirect.   Family has been having discussion recently about "how to get patient and Gala Romney into environments that would best meet their needs." [name] and a brother has HCPOA.  CM discussed that patient would have to be deemed incompetent or lack capacity before she herself could make decisions for her mom. Head CT and MRI are negative for stroke.  A neurology consult was placed 9/2 10:27AM but do not see documentation of the assessment.  Apparently neurology did not receive the referral. Has been evaluated by physical therapy and functionally patient does well and no recommendation for physical therapy follow up. Informed [name] that patient would not qualify for skilled nursing facility placement coverage by medicare at present. Patient  does not have long term care insurance.  [name] has siblings but all are in a "different place" when it comes to long range planning for this patient and their sibling Gala Romney   Action/Plan: Asked unit secretary to notify neurology again of consult. Provided [name] with list of in home continuous care agencies. Notified attending of delay in neurology consult and diagnosis and meds found in Care Everywhere. Discussed benefits of psych consult for competency / capacity as family may need to pursue guardianship in the near future. Discussed need to have patient moved closer to the nursing stattion and possible need for safety sitter.  Expected Discharge Date:  04/21/18               Expected Discharge Plan:     In-House Referral:     Discharge planning Services     Post Acute Care Choice:    Choice offered to:     DME Arranged:    DME Agency:     HH Arranged:    HH Agency:     Status of Service:     If discussed at [employer], dates discussed:    Additional Comments:  Eber Hong, RN 04/20/2018, 4:48 PM

## 2018-04-21 ENCOUNTER — Inpatient Hospital Stay: Payer: Medicare Other

## 2018-04-21 DIAGNOSIS — R4182 Altered mental status, unspecified: Secondary | ICD-10-CM

## 2018-04-21 LAB — CBC
HEMATOCRIT: 33.4 % — AB (ref 35.0–47.0)
HEMOGLOBIN: 12.1 g/dL (ref 12.0–16.0)
MCH: 32.7 pg (ref 26.0–34.0)
MCHC: 36.2 g/dL — ABNORMAL HIGH (ref 32.0–36.0)
MCV: 90.2 fL (ref 80.0–100.0)
PLATELETS: 208 10*3/uL (ref 150–440)
RBC: 3.7 MIL/uL — AB (ref 3.80–5.20)
RDW: 13.4 % (ref 11.5–14.5)
WBC: 3.2 10*3/uL — AB (ref 3.6–11.0)

## 2018-04-21 LAB — PHOSPHORUS: Phosphorus: 3.4 mg/dL (ref 2.5–4.6)

## 2018-04-21 LAB — THYROID PANEL WITH TSH
Free Thyroxine Index: 1.2 (ref 1.2–4.9)
T3 Uptake Ratio: 23 % — ABNORMAL LOW (ref 24–39)
T4, Total: 5.2 ug/dL (ref 4.5–12.0)
TSH: 28.12 u[IU]/mL — ABNORMAL HIGH (ref 0.450–4.500)

## 2018-04-21 LAB — BASIC METABOLIC PANEL
ANION GAP: 4 — AB (ref 5–15)
BUN: <5 mg/dL — ABNORMAL LOW (ref 8–23)
CALCIUM: 8.4 mg/dL — AB (ref 8.9–10.3)
CO2: 28 mmol/L (ref 22–32)
Chloride: 103 mmol/L (ref 98–111)
Creatinine, Ser: 0.51 mg/dL (ref 0.44–1.00)
Glucose, Bld: 119 mg/dL — ABNORMAL HIGH (ref 70–99)
Potassium: 3.7 mmol/L (ref 3.5–5.1)
Sodium: 135 mmol/L (ref 135–145)

## 2018-04-21 LAB — RPR: RPR: NONREACTIVE

## 2018-04-21 LAB — MAGNESIUM: Magnesium: 2.1 mg/dL (ref 1.7–2.4)

## 2018-04-21 LAB — HIV ANTIBODY (ROUTINE TESTING W REFLEX): HIV Screen 4th Generation wRfx: NONREACTIVE

## 2018-04-21 MED ORDER — IRBESARTAN 150 MG PO TABS: 150.0000 mg | ORAL_TABLET | Freq: Every day | ORAL | Status: DC

## 2018-04-21 NOTE — Plan of Care (Signed)

## 2018-04-21 NOTE — Discharge Summary (Signed)
[facility] [place] NAME: Jaime [name]    MR#:  [id number]  DATE OF BIRTH:  24-Jul-1933  DATE OF ADMISSION:  04/19/2018   ADMITTING PHYSICIAN: Dustin Flock, MD  DATE OF DISCHARGE: 04/21/2018  4:20 PM  PRIMARY CARE PHYSICIAN: Ezequiel Kayser, MD   ADMISSION DIAGNOSIS:  Altered mental status, unspecified altered mental status type [R41.82] DISCHARGE DIAGNOSIS:  Principal Problem:   Dementia with behavioral disturbance Active Problems:   Acute encephalopathy   Protein-calorie malnutrition, severe  SECONDARY DIAGNOSIS:   Past Medical History:  Diagnosis Date  . Anxiety   . Depression   . Hypertension   . Hypothyroidism    HOSPITAL COURSE:   Jaime [name] is an 84 year old female with a PMH of HTN, hypothyroidism, depression/anxiety, and cognitive impairment who presented to the ED with AMS. ED work-up was unremarkable except for hypothermia. She was admitted for further management.  Acute encephalopathy- resolved.  - Family thinks she may have taken her son's psych medications by accident - Stroke ruled out with negative MRI - EEG negative - seen by neurology, who did not recommend any additional work-up - ESR, B12, HIV, RPR unremarkable - TSH abnormal- see problem below - PT consulted- no PT f/u recommended  Hypothermia- resolved. - unlikely due to sepsis without any other infectious signs and with procalcitonin of <0.10 - initially on vanc/zosyn, but these were stopped and patient did not have any additional temperature instability  Hypertension- normotensive - held BP meds initially, but restarted these on discharge  Hypothyroidism - TSH high at 28, but T3 and T4 were normal - synthroid continued at home dose due to normal T3 and T4 - needs repeat thyroid studies as an outpatient  DISCHARGE CONDITIONS:  HTN Hypothyroidism Cognitive impairment Anxiety Depression  CONSULTS OBTAINED:  Treatment Team:  Alexis Goodell, MD Clapacs, Madie Reno, MD DRUG ALLERGIES:  No Known Allergies DISCHARGE MEDICATIONS:   Allergies as of 04/21/2018   No Known Allergies     Medication List    TAKE these medications   cyanocobalamin 1000 MCG tablet Take 1,000 mcg by mouth daily.   donepezil 5 MG tablet Commonly known as:  ARICEPT Take 5 mg by mouth every evening.   levothyroxine 137 MCG tablet Commonly known as:  SYNTHROID, LEVOTHROID Take 137 mcg by mouth daily before breakfast.   telmisartan 80 MG tablet Commonly known as:  MICARDIS Take 80 mg by mouth daily.        DISCHARGE INSTRUCTIONS:  1. F/u with PCP in 1-2 weeks 2. Neurology recommended outpatient cardiac monitoring for evaluation of arrhythmias 3. Repeat thyroid studies as an outpatient- TSH high but T3 and T4 normal during hospitalization DIET:  Cardiac diet DISCHARGE CONDITION:  Stable ACTIVITY:  Activity as tolerated OXYGEN:  Home Oxygen: No.  Oxygen Delivery: room air DISCHARGE LOCATION:  home   If you experience worsening of your admission symptoms, develop shortness of breath, life threatening emergency, suicidal or homicidal thoughts you must seek medical attention immediately by calling 911 or calling your MD immediately  if symptoms less severe.  You Must read complete instructions/literature along with all the possible adverse reactions/side effects for all the Medicines you take and that have been prescribed to you. Take any new Medicines after you have completely understood and accpet all the possible adverse reactions/side effects.   Please note  You were cared for by a hospitalist during your hospital stay. If you have any questions about your  discharge medications or the care you received while you were in the hospital after you are discharged, you can call the unit and asked to speak with the hospitalist on call if the hospitalist that took care of you is not available. Once you are discharged, your primary care  physician will handle any further medical issues. Please note that NO REFILLS for any discharge medications will be authorized once you are discharged, as it is imperative that you return to your primary care physician (or establish a relationship with a primary care physician if you do not have one) for your aftercare needs so that they can reassess your need for medications and monitor your lab values.    On the day of Discharge:  VITAL SIGNS:  Blood pressure (!) 172/58, pulse 61, temperature 97.9 F (36.6 C), temperature source Oral, resp. rate 18, height '5\' 5"'  (1.651 m), weight 62.4 kg, SpO2 96 %. PHYSICAL EXAMINATION:  GENERAL:  84 y.o.-year-old patient lying in the bed with no acute distress.  EYES: Pupils equal, round, reactive to light and accommodation. No scleral icterus. Extraocular muscles intact.  HEENT: Head atraumatic, normocephalic. Oropharynx and nasopharynx clear.  NECK:  Supple, no jugular venous distention. No thyroid enlargement, no tenderness.  LUNGS: Normal breath sounds bilaterally, no wheezing, rales,rhonchi or crepitation. No use of accessory muscles of respiration.  CARDIOVASCULAR: RRR, S1, S2 normal. No murmurs, rubs, or gallops.  ABDOMEN: Soft, non-tender, non-distended. Bowel sounds present. No organomegaly or mass.  EXTREMITIES: No pedal edema, cyanosis, or clubbing.  NEUROLOGIC: Cranial nerves II through XII are intact. Muscle strength 5/5 in all extremities. Sensation intact. Gait not checked.  PSYCHIATRIC: The patient is alert. Not oriented. SKIN: No obvious rash, lesion, or ulcer.  DATA REVIEW:   CBC Recent Labs  Lab 04/21/18 0521  WBC 3.2*  HGB 12.1  HCT 33.4*  PLT 208    Chemistries  Recent Labs  Lab 04/19/18 0728  04/21/18 0521  NA 133*   < > 135  K 4.5   < > 3.7  CL 98   < > 103  CO2 25   < > 28  GLUCOSE 139*   < > 119*  BUN 15   < > <5*  CREATININE 0.57   < > 0.51  CALCIUM 9.1   < > 8.4*  MG  --   --  2.1  AST 22  --   --   ALT 12   --   --   ALKPHOS 86  --   --   BILITOT 0.5  --   --    < > = values in this interval not displayed.     Microbiology Results  Results for orders placed or performed during the hospital encounter of 04/19/18  Blood culture (routine x 2)     Status: None (Preliminary result)   Collection Time: 04/19/18  7:29 AM  Result Value Ref Range Status   Specimen Description BLOOD  Final   Special Requests NONE  Final   Culture   Final    NO GROWTH 2 DAYS Performed at [facility], [address] [place], [place] [address]    Report Status PENDING  Incomplete  Blood culture (routine x 2)     Status: None (Preliminary result)   Collection Time: 04/19/18  7:29 AM  Result Value Ref Range Status   Specimen Description BLOOD  Final   Special Requests NONE  Final   Culture   Final  NO GROWTH 2 DAYS Performed at [facility], [address] [place], [place] [address]    Report Status PENDING  Incomplete    RADIOLOGY:  No results found.   Management plans discussed with the patient, family and they are in agreement.  CODE STATUS: Full Code   TOTAL TIME TAKING CARE OF THIS PATIENT: 35 minutes.    Berna Spare Mayo M.D on 04/21/2018 at 5:24 PM  Between 7am to 6pm - Pager - [phone]  After 6pm go to [email or web address] - Technical brewer [facility]  Office  [phone]  CC: Primary care physician; Ezequiel Kayser, MD   Note: This dictation was prepared with Dragon dictation along with smaller phrase technology. Any transcriptional errors that result from this process are unintentional.

## 2018-04-21 NOTE — Care Management (Addendum)
Anticipate patient to discharge home today.  Spoke with  patient's daughter [name].  Does not feel home health is needed at present and patient would not qualify as she is not home bound.  Family will transport to appointments. Family is hiring caregivers that will assure patient and her son take their meds. Patient is not eligible for [facility]. CM discussed contacting Medicare [employer] and requesting RN Case manager to follow

## 2018-04-21 NOTE — Discharge Instructions (Signed)
Call primary care physician for a post hospital visit follow up appointment within 7 days.

## 2018-04-21 NOTE — Procedures (Signed)
ELECTROENCEPHALOGRAM REPORT   Patient: Jaime [name] [facility]       Room #: 252A-AA EEG No. ID: [id number] Age: 84 y.o.        Sex: female Referring Physician: Mayo Report Date:  04/21/2018        Interpreting Physician: Thana Farr  History: Jaime [name] is an 84 y.o. female found down now with mental status changes  Medications:  MVI  Conditions of Recording:  This is a 21 channel routine scalp EEG performed with bipolar and monopolar montages arranged in accordance to the international 10/20 system of electrode placement. One channel was dedicated to EKG recording.  The patient is in the awake state.  Description:  The waking background activity consists of a low voltage, symmetrical, fairly well organized, 8.5 Hz alpha activity, seen from the parieto-occipital and posterior temporal regions.  Low voltage fast activity, poorly organized, is seen anteriorly and is at times superimposed on more posterior regions.  A mixture of theta and alpha rhythms are seen from the central and temporal regions. The patient does not drowse or sleep. No epileptiform activity is noted.   Hyperventilation was not performed.  Intermittent photic stimulation was performed but failed to illicit any change in the tracing.    IMPRESSION: This is a normal awake electroencephalogram and with activation procedures. There are no focal lateralizing or epileptiform features.    Comment:  An EEG with the patient sleep deprived to elicit drowse and light sleep may be desirable to further elicit a possible seizure disorder.     Thana Farr, MD Neurology [phone] 04/21/2018, 2:44 PM

## 2018-04-21 NOTE — Consult Note (Signed)
Reason for Consult:Altered mental status Referring Physician: Auburn Bilberry, MD  CC: Altered mental status  HPI: Jaime [name] is an 84 y.o. female with history as below presenting with altered mental status. She is a poor historian however she is able to provide a few details regarding her history of presenting illness. She state that she was in good state of health prior to the fall and does not know why she fell and was unable to get up. She state that she knew she was on the floor but felt too weak to get up and call for help. Per Ed reports she had been lying on the floor for several hours and was noted to have dried stool to buttocks. On arrival to the ED she was unresponsive and hypothermic. Initial work up including CT head was negative for acute intracranial abnormality. She was admitted for further work up.  Past Medical History:  Diagnosis Date  . Anxiety   . Depression   . Hypertension   . Hypothyroidism     Past Surgical History:  Procedure Laterality Date  . ABDOMINAL HYSTERECTOMY    . appendectomy    . MINOR HEMORRHOIDECTOMY    . OPEN REDUCTION INTERNAL FIXATION (ORIF) DISTAL RADIAL FRACTURE Left 09/11/2015   Procedure: OPEN REDUCTION INTERNAL FIXATION (ORIF) DISTAL RADIAL FRACTURE;  Surgeon: Kennedy Bucker, MD;  Location: [facility] ORS;  Service: Orthopedics;  Laterality: Left;  . TONSILLECTOMY    . TUBAL LIGATION      Family History  Problem Relation Age of Onset  . Breast cancer Maternal Aunt        2 mat aunts    Social History:  reports that she has never smoked. She has never used smokeless tobacco. She reports that she drank alcohol. She reports that she does not use drugs.  No Known Allergies  Medications:  I have reviewed the patient's current medications. Prior to Admission:  Medications Prior to Admission  Medication Sig Dispense Refill Last Dose  . cyanocobalamin 1000 MCG tablet Take 1,000 mcg by mouth daily.    Unknown at Unknown  . donepezil  (ARICEPT) 5 MG tablet Take 5 mg by mouth every evening.   Unknown at Unknown  . levothyroxine (SYNTHROID, LEVOTHROID) 137 MCG tablet Take 137 mcg by mouth daily before breakfast.   Unknown at Unknown  . telmisartan (MICARDIS) 80 MG tablet Take 80 mg by mouth daily.   Unknown at Unknown   Scheduled: . enoxaparin (LOVENOX) injection  40 mg Subcutaneous Q24H  . feeding supplement (ENSURE ENLIVE)  237 mL Oral BID BM  . multivitamin with minerals  1 tablet Oral Daily    ROS: History obtained from the patient   General ROS: negative for - chills, fatigue, fever, night sweats, weight gain or weight loss Psychological ROS: negative for - behavioral disorder, hallucinations,  mood swings or suicidal ideation. Positive for memory difficulties, Ophthalmic ROS: negative for - blurry vision, double vision, eye pain or loss of vision ENT ROS: negative for - epistaxis, nasal discharge, oral lesions, sore throat, tinnitus or vertigo Allergy and Immunology ROS: negative for - hives or itchy/watery eyes Hematological and Lymphatic ROS: negative for - bleeding problems, bruising or swollen lymph nodes Endocrine ROS: negative for - galactorrhea, hair pattern changes, polydipsia/polyuria or temperature intolerance Respiratory ROS: negative for - cough, hemoptysis, shortness of breath or wheezing Cardiovascular ROS: negative for - chest pain, dyspnea on exertion, edema or irregular heartbeat Gastrointestinal ROS: negative for - abdominal pain, diarrhea, hematemesis, nausea/vomiting or  stool incontinence Genito-Urinary ROS: negative for - dysuria, hematuria, incontinence or urinary frequency/urgency Musculoskeletal ROS: negative for - joint swelling or muscular weakness Neurological ROS: as noted in HPI Dermatological ROS: negative for rash and skin lesion changes  Physical Exam   Vitals Blood pressure (!) 172/58, pulse 61, temperature 97.9 F (36.6 C), temperature source Oral, resp. rate 18, height 5\' 5"   (1.651 m), weight 62.4 kg, SpO2 96 %.   General Exam . Patient looks appropriate of age, well built, nourished and appropriately groomed.  . Cardiovascular Exam: S1, S2 heart sounds present  . Carotid exam revealed no bruit  . Lung exam was clear to auscultation   Neurological Exam . Alert,  . Oriented to place, person  . Disoriented to date, follows two steps simple commands . Attention span and concentration seemed appropriate  . Language seemed intact (naming, spontaneous speech, comprehension)  . I. Olfactory not examined . II: Visual fields were full. Pupils were equal, round and reactive to light and accommodation . III,IV, VI: ptosis not present, extra-ocular motions intact bilaterally . V,VII: smile symmetric, facial light touch sensation normal bilaterally . VIII: Finger rub was heard symmetric in both ears . IX, X: Palate and uvular movements are normal and oral sensations are OK, gag reflex deffered . XI: Neck muscle strength and shoulder shrug is normal . XII: midline tongue extension . Tone is normal in all extremities, no abnormal movements seen  . Muscle strength in all extremities seemed normal.  . Deep tendon reflexes were symmetric  . Sensations were intact to light touch in all extremities  . Gait and station are normal when examined in small exam room   Laboratory Studies:   Basic Metabolic Panel: Recent Labs  Lab 04/19/18 0728 04/20/18 0255 04/21/18 0521  NA 133* 130* 135  K 4.5 3.9 3.7  CL 98 101 103  CO2 25 25 28   GLUCOSE 139* 128* 119*  BUN 15 8 <5*  CREATININE 0.57 0.61 0.51  CALCIUM 9.1 8.2* 8.4*  MG  --   --  2.1  PHOS  --   --  3.4    Liver Function Tests: Recent Labs  Lab 04/19/18 0728  AST 22  ALT 12  ALKPHOS 86  BILITOT 0.5  PROT 6.7  ALBUMIN 3.8   No results for input(s): LIPASE, AMYLASE in the last 168 hours. Recent Labs  Lab 04/19/18 0728  AMMONIA 15    CBC: Recent Labs  Lab 04/19/18 0728 04/20/18 0255  04/21/18 0521  WBC 9.0 5.2 3.2*  NEUTROABS 7.7*  --   --   HGB 13.5 11.3* 12.1  HCT 38.2 31.8* 33.4*  MCV 91.1 91.2 90.2  PLT 263 217 208    Cardiac Enzymes: Recent Labs  Lab 04/19/18 0728  CKTOTAL 152  TROPONINI <0.03    BNP: Invalid input(s): POCBNP  CBG: No results for input(s): GLUCAP in the last 168 hours.  Microbiology: Results for orders placed or performed during the hospital encounter of 04/19/18  Blood culture (routine x 2)     Status: None (Preliminary result)   Collection Time: 04/19/18  7:29 AM  Result Value Ref Range Status   Specimen Description BLOOD  Final   Special Requests NONE  Final   Culture   Final    NO GROWTH 2 DAYS Performed at [facility], [address], [place], [place] [address]    Report Status PENDING  Incomplete  Blood culture (routine x 2)     Status:  None (Preliminary result)   Collection Time: 04/19/18  7:29 AM  Result Value Ref Range Status   Specimen Description BLOOD  Final   Special Requests NONE  Final   Culture   Final    NO GROWTH 2 DAYS Performed at [facility], [address] [place], [place] [address]    Report Status PENDING  Incomplete    Coagulation Studies: No results for input(s): LABPROT, INR in the last 72 hours.  Urinalysis:  Recent Labs  Lab 04/19/18 0728  COLORURINE YELLOW*  LABSPEC 1.013  PHURINE 7.0  GLUCOSEU NEGATIVE  HGBUR SMALL*  BILIRUBINUR NEGATIVE  KETONESUR NEGATIVE  PROTEINUR NEGATIVE  NITRITE NEGATIVE  LEUKOCYTESUR NEGATIVE    Lipid Panel:  No results found for: CHOL, TRIG, HDL, CHOLHDL, VLDL, LDLCALC  HgbA1C: No results found for: HGBA1C  Urine Drug Screen:      Component Value Date/Time   LABOPIA NONE DETECTED 04/19/2018 0728   COCAINSCRNUR NONE DETECTED 04/19/2018 0728   LABBENZ TEST NOT PERFORMED, REAGENT NOT AVAILABLE (A) 04/19/2018 0728   AMPHETMU NONE DETECTED 04/19/2018 0728   THCU NONE DETECTED 04/19/2018 0728   LABBARB NONE DETECTED  04/19/2018 0728    Alcohol Level:  Recent Labs  Lab 04/19/18 0917  ETH <10    Other results: EKG: normal EKG, normal sinus rhythm, unchanged from previous tracings.  Imaging: Mr Brain Wo Contrast  Result Date: 04/20/2018 CLINICAL DATA:  Altered mental status, found down today. History of hypertension. EXAM: MRI HEAD WITHOUT CONTRAST TECHNIQUE: Multiplanar, multiecho pulse sequences of the brain and surrounding structures were obtained without intravenous contrast. COMPARISON:  CT HEAD April 19, 2018 FINDINGS: Mild motion degraded examination. INTRACRANIAL CONTENTS: No reduced diffusion to suggest acute ischemia. No susceptibility artifact to suggest hemorrhage. The ventricles and sulci are normal for patient's age. Faint supratentorial white matter FLAIR T2 hyperintensities compatible with mild chronic small vessel ischemic changes, less than expected for age. No suspicious parenchymal signal, masses, mass effect. No abnormal extra-axial fluid collections. No extra-axial masses. VASCULAR: Normal major intracranial vascular flow voids present at skull base. SKULL AND UPPER CERVICAL SPINE: No abnormal sellar expansion. No suspicious calvarial bone marrow signal. Craniocervical junction maintained. SINUSES/ORBITS: The mastoid air-cells and included paranasal sinuses are well-aerated. The included ocular globes and orbital contents are non-suspicious. OTHER: None. IMPRESSION: Negative motion degraded noncontrast MRI head for age. Electronically Signed   By: Awilda Metro M.D.   On: 04/20/2018 01:09    Patient seen and examined.  Clinical course and management discussed.  Necessary edits performed.  I agree with the above.  Assessment and plan of care developed and discussed below.    Assessment: 84 y.o. female with a known history of anxiety, hypertension, hypothyroidism presenting with altered mental status and unresponsiveness. From conversation with daughter appears to have an underlying  dementia.  MRI brain which was personally reviewed was negative for acute intracranial abnormality. Lab work was unremarkable other than elevated TSH.  Patient likely noncompliant.  This may be contributing to progressive cognitive deficits.  Etiology for unresponsiveness unclear.  Further work up recommended.   Plan 1. EEG  2. Orthostatic vitals. 3. Consider prolonged cardiac monitoring as an outpatient 4. Patient to follow up with neurology after discharge for further evaluation of dementia.   5. Compliance with medications discussed  This patient was staffed with Dr. Verlon Au, Thad Ranger who personally evaluated patient, reviewed documentation and agreed with assessment and plan of care as above.  Webb Silversmith, DNP, FNP-BC Board certified Nurse  Practitioner Neurology Department  04/21/2018, 11:25 AM      Thana Farr, MD Neurology [phone]  04/21/2018  2:54 PM

## 2018-04-21 NOTE — Clinical Social Work Note (Signed)
CSW received referral for SNF.  Case discussed with case manager and plan is to discharge home with home health.  CSW to sign off please re-consult if social work needs arise.  Eudelia Hiltunen R. Jorey Dollard, MSW, LCSWA [phone]  

## 2018-04-21 NOTE — Progress Notes (Signed)
eeg completed ° °

## 2018-04-21 NOTE — Plan of Care (Signed)
  Problem: Education: Goal: Knowledge of General Education information will improve Description: Including pain rating scale, medication(s)/side effects and non-pharmacologic comfort measures Outcome: Progressing   Problem: Pain Managment: Goal: General experience of comfort will improve Outcome: Progressing   Problem: Safety: Goal: Ability to remain free from injury will improve Outcome: Progressing   

## 2018-04-24 LAB — CULTURE, BLOOD (ROUTINE X 2)
CULTURE: NO GROWTH
Culture: NO GROWTH

## 2019-08-21 ENCOUNTER — Inpatient Hospital Stay
Admission: EM | Admit: 2019-08-21 | Discharge: 2019-08-24 | DRG: 480 | Disposition: A | Discharge status: Skilled Nursing Facility | Payer: Medicare Other | Attending: Internal Medicine | Admitting: Internal Medicine

## 2019-08-21 ENCOUNTER — Other Ambulatory Visit: Payer: Self-pay

## 2019-08-21 ENCOUNTER — Emergency Department: Payer: Medicare Other

## 2019-08-21 DIAGNOSIS — W1830XA Fall on same level, unspecified, initial encounter: Secondary | ICD-10-CM | POA: Diagnosis present

## 2019-08-21 DIAGNOSIS — Z888 Allergy status to other drugs, medicaments and biological substances status: Secondary | ICD-10-CM | POA: Diagnosis not present

## 2019-08-21 DIAGNOSIS — F329 Major depressive disorder, single episode, unspecified: Secondary | ICD-10-CM | POA: Diagnosis present

## 2019-08-21 DIAGNOSIS — D62 Acute posthemorrhagic anemia: Secondary | ICD-10-CM | POA: Diagnosis not present

## 2019-08-21 DIAGNOSIS — G301 Alzheimer's disease with late onset: Secondary | ICD-10-CM | POA: Diagnosis not present

## 2019-08-21 DIAGNOSIS — E039 Hypothyroidism, unspecified: Secondary | ICD-10-CM | POA: Diagnosis present

## 2019-08-21 DIAGNOSIS — S72002D Fracture of unspecified part of neck of left femur, subsequent encounter for closed fracture with routine healing: Secondary | ICD-10-CM | POA: Diagnosis not present

## 2019-08-21 DIAGNOSIS — E871 Hypo-osmolality and hyponatremia: Secondary | ICD-10-CM | POA: Diagnosis present

## 2019-08-21 DIAGNOSIS — S72402A Unspecified fracture of lower end of left femur, initial encounter for closed fracture: Secondary | ICD-10-CM | POA: Diagnosis present

## 2019-08-21 DIAGNOSIS — F0281 Dementia in other diseases classified elsewhere with behavioral disturbance: Secondary | ICD-10-CM | POA: Diagnosis not present

## 2019-08-21 DIAGNOSIS — Z79890 Hormone replacement therapy (postmenopausal): Secondary | ICD-10-CM

## 2019-08-21 DIAGNOSIS — F0391 Unspecified dementia with behavioral disturbance: Secondary | ICD-10-CM | POA: Diagnosis present

## 2019-08-21 DIAGNOSIS — F419 Anxiety disorder, unspecified: Secondary | ICD-10-CM | POA: Diagnosis present

## 2019-08-21 DIAGNOSIS — S72123A Displaced fracture of lesser trochanter of unspecified femur, initial encounter for closed fracture: Secondary | ICD-10-CM | POA: Diagnosis present

## 2019-08-21 DIAGNOSIS — Z20822 Contact with and (suspected) exposure to covid-19: Secondary | ICD-10-CM | POA: Diagnosis present

## 2019-08-21 DIAGNOSIS — S72142A Displaced intertrochanteric fracture of left femur, initial encounter for closed fracture: Secondary | ICD-10-CM | POA: Diagnosis present

## 2019-08-21 DIAGNOSIS — I10 Essential (primary) hypertension: Secondary | ICD-10-CM | POA: Diagnosis present

## 2019-08-21 DIAGNOSIS — S7222XA Displaced subtrochanteric fracture of left femur, initial encounter for closed fracture: Secondary | ICD-10-CM | POA: Diagnosis present

## 2019-08-21 DIAGNOSIS — E43 Unspecified severe protein-calorie malnutrition: Secondary | ICD-10-CM | POA: Diagnosis present

## 2019-08-21 DIAGNOSIS — W19XXXA Unspecified fall, initial encounter: Secondary | ICD-10-CM

## 2019-08-21 DIAGNOSIS — Z79899 Other long term (current) drug therapy: Secondary | ICD-10-CM | POA: Diagnosis not present

## 2019-08-21 DIAGNOSIS — Z419 Encounter for procedure for purposes other than remedying health state, unspecified: Secondary | ICD-10-CM

## 2019-08-21 DIAGNOSIS — F03918 Unspecified dementia, unspecified severity, with other behavioral disturbance: Secondary | ICD-10-CM | POA: Diagnosis present

## 2019-08-21 DIAGNOSIS — S72143A Displaced intertrochanteric fracture of unspecified femur, initial encounter for closed fracture: Secondary | ICD-10-CM

## 2019-08-21 DIAGNOSIS — S72002A Fracture of unspecified part of neck of left femur, initial encounter for closed fracture: Secondary | ICD-10-CM

## 2019-08-21 HISTORY — DX: Unspecified dementia, unspecified severity, without behavioral disturbance, psychotic disturbance, mood disturbance, and anxiety: F03.90

## 2019-08-21 LAB — COMPREHENSIVE METABOLIC PANEL
ALT: 17 U/L (ref 0–44)
AST: 24 U/L (ref 15–41)
Albumin: 3.7 g/dL (ref 3.5–5.0)
Alkaline Phosphatase: 85 U/L (ref 38–126)
Anion gap: 9 (ref 5–15)
BUN: 13 mg/dL (ref 8–23)
CO2: 24 mmol/L (ref 22–32)
Calcium: 8.9 mg/dL (ref 8.9–10.3)
Chloride: 98 mmol/L (ref 98–111)
Creatinine, Ser: 0.68 mg/dL (ref 0.44–1.00)
GFR calc Af Amer: >60 mL/min (ref >60)
GFR calc non Af Amer: >60 mL/min (ref >60)
Glucose, Bld: 123 mg/dL — ABNORMAL HIGH (ref 70–99)
Potassium: 4.3 mmol/L (ref 3.5–5.1)
Sodium: 131 mmol/L — ABNORMAL LOW (ref 135–145)
Total Bilirubin: 0.5 mg/dL (ref 0.3–1.2)
Total Protein: 6.8 g/dL (ref 6.5–8.1)

## 2019-08-21 LAB — CBC WITH DIFFERENTIAL/PLATELET
Abs Immature Granulocytes: 0.02 10*3/uL (ref 0.00–0.07)
Basophils Absolute: 0 10*3/uL (ref 0.0–0.1)
Basophils Relative: 1 %
Eosinophils Absolute: 0.1 10*3/uL (ref 0.0–0.5)
Eosinophils Relative: 1 %
HCT: 38.7 % (ref 36.0–46.0)
Hemoglobin: 12.8 g/dL (ref 12.0–15.0)
Immature Granulocytes: 0 %
Lymphocytes Relative: 22 %
Lymphs Abs: 1.4 10*3/uL (ref 0.7–4.0)
MCH: 30.7 pg (ref 26.0–34.0)
MCHC: 33.1 g/dL (ref 30.0–36.0)
MCV: 92.8 fL (ref 80.0–100.0)
Monocytes Absolute: 0.8 10*3/uL (ref 0.1–1.0)
Monocytes Relative: 12 %
Neutro Abs: 4.1 10*3/uL (ref 1.7–7.7)
Neutrophils Relative %: 64 %
Platelets: 161 10*3/uL (ref 150–400)
RBC: 4.17 MIL/uL (ref 3.87–5.11)
RDW: 12.7 % (ref 11.5–15.5)
WBC: 6.4 10*3/uL (ref 4.0–10.5)
nRBC: 0 % (ref 0.0–0.2)

## 2019-08-21 LAB — TROPONIN I (HIGH SENSITIVITY): Troponin I (High Sensitivity): 6 ng/L (ref <18)

## 2019-08-21 MED ORDER — ACETAMINOPHEN 325 MG PO TABS
650.0000 mg | ORAL_TABLET | Freq: Four times a day (QID) | ORAL | Status: DC | PRN
  Administered 2019-08-23 – 2019-08-24 (×3): 650 mg via ORAL
  Filled 2019-08-21 (×2): qty 2

## 2019-08-21 MED ORDER — SODIUM CHLORIDE 0.9 % IV SOLN: INTRAVENOUS | Status: AC

## 2019-08-21 MED ORDER — HYDRALAZINE HCL 20 MG/ML IJ SOLN
5.0000 mg | Freq: Four times a day (QID) | INTRAMUSCULAR | Status: DC | PRN
  Filled 2019-08-21: qty 0.25

## 2019-08-21 MED ORDER — ONDANSETRON HCL 4 MG/2ML IJ SOLN
4.0000 mg | Freq: Once | INTRAMUSCULAR | Status: AC
  Administered 2019-08-21: 4 mg via INTRAVENOUS
  Filled 2019-08-21: qty 2

## 2019-08-21 MED ORDER — ENOXAPARIN SODIUM 40 MG/0.4ML ~~LOC~~ SOLN
40.0000 mg | SUBCUTANEOUS | Status: DC
  Administered 2019-08-21: 40 mg via SUBCUTANEOUS
  Filled 2019-08-21: qty 0.4

## 2019-08-21 MED ORDER — HYDROMORPHONE HCL 1 MG/ML IJ SOLN
0.5000 mg | INTRAMUSCULAR | Status: DC | PRN
  Administered 2019-08-22: 14:00:00 .25 mg via INTRAVENOUS
  Administered 2019-08-22: 0.5 mg via INTRAVENOUS
  Administered 2019-08-22: .25 mg via INTRAVENOUS
  Administered 2019-08-22 (×2): 0.5 mg via INTRAVENOUS
  Filled 2019-08-21 (×2): qty 1

## 2019-08-21 MED ORDER — ONDANSETRON HCL 4 MG/2ML IJ SOLN: 4.0000 mg | Freq: Four times a day (QID) | INTRAMUSCULAR | Status: DC | PRN

## 2019-08-21 MED ORDER — MORPHINE SULFATE (PF) 4 MG/ML IV SOLN
4.0000 mg | Freq: Once | INTRAVENOUS | Status: AC
  Administered 2019-08-21: 22:00:00 4 mg via INTRAVENOUS
  Filled 2019-08-21: qty 1

## 2019-08-21 MED ORDER — ACETAMINOPHEN 650 MG RE SUPP: 650.0000 mg | Freq: Four times a day (QID) | RECTAL | Status: DC | PRN

## 2019-08-21 NOTE — ED Notes (Signed)
Pt reports "mouth so dry she cannot sleep."

## 2019-08-21 NOTE — ED Triage Notes (Signed)
Pt with witnessed fall, pt slipped [place] curb [place] [employer], injuring left hip. Pt complains of left hip and mid femur pain. Cms intact to left foot, both feet are cool to touch equally. Pt is alert, oriented x4 perrl 60mm brisk, received iv of fentanyl. Pt denies head injury, states repeatedly "I can't believe this happened to me, i'm so careful".

## 2019-08-21 NOTE — H&P (Addendum)
TRH H&P    Patient Demographics:    Jaime [name], is a 86 y.o. female  MRN: [id number]  DOB - 05/20/1933  Admit Date - 08/21/2019  Referring MD/NP/PA: Mayford Knife  Outpatient Primary MD for the patient is Mickey Farber, MD  Patient coming from:  home  Chief complaint- fall   HPI:    Jaime [name]  is a 86 y.o. female,  w hypertension,  Hypothyroidism, anxiety/ depression, ? Dementia apparently presents with mechanical fall and c/o left femur, hip pain.   In ED,  T 97.6, P 77 R 20, Bp 199/86  Pox 97% on RA Wt 75.8kg   Left femur IMPRESSION: 1. Acute comminuted left intertrochanteric fracture with about 1/2 shaft diameter medial displacement. 2. Displaced lesser trochanteric fracture fragment. Acute comminuted and displaced left intertrochanteric fracture.  CXR IMPRESSION: No active disease.  Wbc 6.4, Hgb 12.8, Plt 161  Pt will be admitted for left intertrochanteric fracture.     Review of systems:    In addition to the HPI above,  No Fever-chills, No Headache, No changes with Vision or hearing, No problems swallowing food or Liquids, No Chest pain, Cough or Shortness of Breath, No Abdominal pain, No Nausea or Vomiting, bowel movements are regular, No Blood in stool or Urine, No dysuria, No new skin rashes or bruises,   No new weakness, tingling, numbness in any extremity, No recent weight gain or loss, No polyuria, polydypsia or polyphagia, No significant Mental Stressors.  All other systems reviewed and are negative.    Past History of the following :    Past Medical History:  Diagnosis Date  . Anxiety   . Depression   . Hypertension   . Hypothyroidism       Past Surgical History:  Procedure Laterality Date  . ABDOMINAL HYSTERECTOMY    . appendectomy    . MINOR HEMORRHOIDECTOMY    . OPEN REDUCTION INTERNAL FIXATION (ORIF) DISTAL RADIAL FRACTURE Left 09/11/2015    Procedure: OPEN REDUCTION INTERNAL FIXATION (ORIF) DISTAL RADIAL FRACTURE;  Surgeon: Kennedy Bucker, MD;  Location: [facility] ORS;  Service: Orthopedics;  Laterality: Left;  . TONSILLECTOMY    . TUBAL LIGATION        Social History:      Social History   Tobacco Use  . Smoking status: Never Smoker  . Smokeless tobacco: Never Used  Substance Use Topics  . Alcohol use: Not Currently       Family History :     Family History  Problem Relation Age of Onset  . Breast cancer Maternal Aunt        2 mat aunts       Home Medications:   Prior to Admission medications   Medication Sig Start Date End Date Taking? Authorizing Provider  cyanocobalamin 1000 MCG tablet Take 1,000 mcg by mouth daily.     [provider]  donepezil (ARICEPT) 5 MG tablet Take 5 mg by mouth every evening. 08/13/17 08/13/18  [provider]  levothyroxine (SYNTHROID, LEVOTHROID) 137 MCG tablet Take 137 mcg by  mouth daily before breakfast.    [provider]  telmisartan (MICARDIS) 80 MG tablet Take 80 mg by mouth daily.    [provider]     Allergies:    No Known Allergies   Physical Exam:   Vitals  Blood pressure (!) 192/79, pulse 79, temperature 97.6 F (36.4 C), temperature source Oral, resp. rate 17, height 5\' 8"  (1.727 m), weight 75.8 kg, SpO2 98 %.  1.  General: axoxo1 (person), not place or year  2. Psychiatric: euthymic  3. Neurologic: nonfocal  4. HEENMT:  Anicteric,  Pupils 1.21mm symmetric, direct, consensual, intact Neck: no jvd  5. Respiratory : CTAB  6. Cardiovascular : rrr s1, s2,   7. Gastrointestinal:  ABd: soft, nt, nd, =bs  8. Skin:  Ext: no c/c/e,  No rash  9.Musculoskeletal:  Good ROM    Data Review:    CBC Recent Labs  Lab 08/21/19 1941  WBC 6.4  HGB 12.8  HCT 38.7  PLT 161  MCV 92.8  MCH 30.7  MCHC 33.1  RDW 12.7  LYMPHSABS 1.4  MONOABS 0.8  EOSABS 0.1  BASOSABS 0.0    ------------------------------------------------------------------------------------------------------------------  Results for orders placed or performed during the hospital encounter of 08/21/19 (from the past 48 hour(s))  CBC with Differential     Status: None   Collection Time: 08/21/19  7:41 PM  Result Value Ref Range   WBC 6.4 4.0 - 10.5 K/uL   RBC 4.17 3.87 - 5.11 MIL/uL   Hemoglobin 12.8 12.0 - 15.0 g/dL   HCT 10/19/19 86.5 - 78.4 %   MCV 92.8 80.0 - 100.0 fL   MCH 30.7 26.0 - 34.0 pg   MCHC 33.1 30.0 - 36.0 g/dL   RDW 69.6 29.5 - 28.4 %   Platelets 161 150 - 400 K/uL   nRBC 0.0 0.0 - 0.2 %   Neutrophils Relative % 64 %   Neutro Abs 4.1 1.7 - 7.7 K/uL   Lymphocytes Relative 22 %   Lymphs Abs 1.4 0.7 - 4.0 K/uL   Monocytes Relative 12 %   Monocytes Absolute 0.8 0.1 - 1.0 K/uL   Eosinophils Relative 1 %   Eosinophils Absolute 0.1 0.0 - 0.5 K/uL   Basophils Relative 1 %   Basophils Absolute 0.0 0.0 - 0.1 K/uL   Immature Granulocytes 0 %   Abs Immature Granulocytes 0.02 0.00 - 0.07 K/uL    Comment: Performed at [facility], [address] [place], [place]    Chemistries  No results for input(s): NA, K, CL, CO2, GLUCOSE, BUN, CREATININE, CALCIUM, MG, AST, ALT, ALKPHOS, BILITOT in the last 168 hours.  Invalid input(s): GFRCGP ------------------------------------------------------------------------------------------------------------------  ------------------------------------------------------------------------------------------------------------------ GFR: CrCl cannot be calculated (Patient's most recent lab result is older than the maximum 21 days allowed.). Liver Function Tests: No results for input(s): AST, ALT, ALKPHOS, BILITOT, PROT, ALBUMIN in the last 168 hours. No results for input(s): LIPASE, AMYLASE in the last 168 hours. No results for input(s): AMMONIA in the last 168 hours. Coagulation Profile: No results for input(s): INR, PROTIME in  the last 168 hours. Cardiac Enzymes: No results for input(s): CKTOTAL, CKMB, CKMBINDEX, TROPONINI in the last 168 hours. BNP (last 3 results) No results for input(s): PROBNP in the last 8760 hours. HbA1C: No results for input(s): HGBA1C in the last 72 hours. CBG: No results for input(s): GLUCAP in the last 168 hours. Lipid Profile: No results for input(s): CHOL, HDL, LDLCALC, TRIG, CHOLHDL, LDLDIRECT in the last  72 hours. Thyroid Function Tests: No results for input(s): TSH, T4TOTAL, FREET4, T3FREE, THYROIDAB in the last 72 hours. Anemia Panel: No results for input(s): VITAMINB12, FOLATE, FERRITIN, TIBC, IRON, RETICCTPCT in the last 72 hours.  --------------------------------------------------------------------------------------------------------------- Urine analysis:    Component Value Date/Time   COLORURINE YELLOW (A) 04/19/2018 0728   APPEARANCEUR CLEAR (A) 04/19/2018 0728   LABSPEC 1.013 04/19/2018 0728   PHURINE 7.0 04/19/2018 0728   GLUCOSEU NEGATIVE 04/19/2018 0728   HGBUR SMALL (A) 04/19/2018 0728   BILIRUBINUR NEGATIVE 04/19/2018 0728   KETONESUR NEGATIVE 04/19/2018 0728   PROTEINUR NEGATIVE 04/19/2018 0728   NITRITE NEGATIVE 04/19/2018 0728   LEUKOCYTESUR NEGATIVE 04/19/2018 0728      Imaging Results:    DG Chest 1 View  Result Date: 08/21/2019 CLINICAL DATA:  Fall with femur fracture EXAM: CHEST  1 VIEW COMPARISON:  04/19/2018 FINDINGS: No focal airspace disease or effusion. Stable cardiomediastinal silhouette with aortic atherosclerosis. No pneumothorax. IMPRESSION: No active disease. Electronically Signed   By: Donavan Foil M.D.   On: 08/21/2019 20:25   DG Femur Portable Min 2 Views Left  Result Date: 08/21/2019 CLINICAL DATA:  Fall with left leg pain EXAM: LEFT FEMUR PORTABLE 2 VIEWS COMPARISON:  None. FINDINGS: Acute comminuted left intertrochanteric fracture with about 1/2 shaft diameter medial displacement of the shaft. Displaced lesser trochanteric fracture  fragment. Linear fracture lucency extends to the base of the femoral neck and the fracture also involves the subtrochanteric shaft of the femur. IMPRESSION: 1. Acute comminuted left intertrochanteric fracture with about 1/2 shaft diameter medial displacement. 2. Displaced lesser trochanteric fracture fragment. Acute comminuted and displaced left intertrochanteric fracture. Electronically Signed   By: Donavan Foil M.D.   On: 08/21/2019 20:25   ekg nsr at 19, nl axis, no st-t changes c/w ischemia   Assessment & Plan:    Principal Problem:   Intertrochanteric fracture (HCC) Active Problems:   Dementia with behavioral disturbance (HCC) L intertrochanteric fracture secondary to mechanical fall NPO after MN Dilaudid 0.5mg  iv q4h prn  Zofran 4mg  iv q6h prn  Orthopedics consulted by ED, appreciate inptu  Hypertension Telmisartan-> Irbesartan  300mg  po qday Hydralazine 5mg  iv q6h prn sbp >160  Hypothyroidism Cont Levothyroxine 159micrograms po qday  Dementia Stable  Hyponatremia Hydrate with ns iv Check cmp in am   DVT Prophylaxis-   Lovenox - SCDs   AM Labs Ordered, also please review Full Orders  Family Communication: Admission, patients condition and plan of care including tests being ordered have been discussed with the patient who indicate understanding and agree with the plan and Code Status.  Code Status:  FULL CODE per patient, notfied  Admission status: Inpatient: Based on patients clinical presentation and evaluation of above clinical data, I have made determination that patient meets Inpatient criteria at this time.  Pt has high risk of clincal deterioration without surgical correction of L intertrochanteric fracture. Pt will require >2 nites stay  Time spent in minutes : 55 minutes   Jani Gravel M.D on 08/21/2019 at 8:55 PM

## 2019-08-21 NOTE — ED Notes (Signed)
Pure wick placed. Pants cut off pt due to pt being unable to tolerate rn removing.

## 2019-08-21 NOTE — ED Provider Notes (Signed)
[facility] Emergency Department Provider Note       Time seen: ----------------------------------------- 7:29 PM on 08/21/2019 ----------------------------------------- Level V caveat: History/ROS limited by altered mental status  I have reviewed the triage vital signs and the nursing notes.  HISTORY   Chief Complaint No chief complaint on file.    HPI Jaime [name] is a 86 y.o. female with a history of anxiety, depression, hypertension, hypothyroidism who presents to the ED for a fall.  Patient states she was walking out of [employer] and missed, landing on the left leg.  She is complaining of left thigh pain.  She cannot bear any weight on the left leg.  Received fentanyl in route with some improvement in her pain but has become disoriented on arrival.  Past Medical History:  Diagnosis Date  . Anxiety   . Depression   . Hypertension   . Hypothyroidism     Patient Active Problem List   Diagnosis Date Noted  . Protein-calorie malnutrition, severe 04/20/2018  . Dementia with behavioral disturbance ([place]) 04/20/2018  . Acute encephalopathy 04/19/2018    Past Surgical History:  Procedure Laterality Date  . ABDOMINAL HYSTERECTOMY    . appendectomy    . MINOR HEMORRHOIDECTOMY    . OPEN REDUCTION INTERNAL FIXATION (ORIF) DISTAL RADIAL FRACTURE Left 09/11/2015   Procedure: OPEN REDUCTION INTERNAL FIXATION (ORIF) DISTAL RADIAL FRACTURE;  Surgeon: Hessie Knows, MD;  Location: [facility] ORS;  Service: Orthopedics;  Laterality: Left;  . TONSILLECTOMY    . TUBAL LIGATION      Allergies Patient has no known allergies.  Social History Social History   Tobacco Use  . Smoking status: Never Smoker  . Smokeless tobacco: Never Used  Substance Use Topics  . Alcohol use: Not Currently  . Drug use: Never    Review of Systems Positive for left leg pain, otherwise unknown  All systems negative/normal/unremarkable except as stated in the  HPI  ____________________________________________   PHYSICAL EXAM:  VITAL SIGNS: ED Triage Vitals  Enc Vitals Group     BP      Pulse      Resp      Temp      Temp src      SpO2      Weight      Height      Head Circumference      Peak Flow      Pain Score      Pain Loc      Pain Edu?      Excl. in [place]?    Constitutional: Alert and oriented.  Mild distressed and anxious Eyes: Conjunctivae are normal. Normal extraocular movements. ENT      Head: Normocephalic and atraumatic.      Nose: No congestion/rhinnorhea.      Mouth/Throat: Mucous membranes are moist.      Neck: No stridor. Cardiovascular: Normal rate, regular rhythm. No murmurs, rubs, or gallops. Respiratory: Normal respiratory effort without tachypnea nor retractions. Breath sounds are clear and equal bilaterally. No wheezes/rales/rhonchi. Gastrointestinal: Soft and nontender. Normal bowel sounds Musculoskeletal: Left leg tenderness approximately mid thigh, no obvious knee or hip tenderness focally. Neurologic:  Normal speech and language. No gross focal neurologic deficits are appreciated.  Skin:  Skin is warm, dry and intact. No rash noted. Psychiatric: Anxious mood and affect ____________________________________________  EKG: Interpreted by me.  Sinus rhythm with rate of 70 bpm, likely arm lead electrode reversal, normal PR interval, normal QRS, normal QT  ____________________________________________  ED COURSE:  As part of my medical decision making, I reviewed the following data within the electronic MEDICAL RECORD NUMBER History obtained from family if available, nursing notes, old chart and ekg, as well as notes from prior ED visits. Patient presented for fall with left hip pain, we will assess with labs and imaging as indicated at this time. Clinical Course as of Aug 20 2034  Wynelle Link Aug 21, 2019  2027 Discussed with Dr. Odis Luster from orthopedic surgery.   [JW]  2035 Patient with a history of dementia, no blood  thinners. Has not had bp meds tonight   [JW]    Clinical Course User Index [JW] Emily Filbert, MD   Procedures  Loyal Gambler Leavelle was evaluated in Emergency Department on 08/21/2019 for the symptoms described in the history of present illness. She was evaluated in the context of the global COVID-19 pandemic, which necessitated consideration that the patient might be at risk for infection with the SARS-CoV-2 virus that causes COVID-19. Institutional protocols and algorithms that pertain to the evaluation of patients at risk for COVID-19 are in a state of rapid change based on information released by regulatory bodies including the CDC and federal and state organizations. These policies and algorithms were followed during the patient's care in the ED.  ____________________________________________   LABS (pertinent positives/negatives)  Labs Reviewed  SARS CORONAVIRUS 2 (TAT 6-24 HRS)  CBC WITH DIFFERENTIAL/PLATELET  COMPREHENSIVE METABOLIC PANEL  TROPONIN I (HIGH SENSITIVITY)    RADIOLOGY Images were viewed by me  Left femur x-rays  IMPRESSION:  1. Acute comminuted left intertrochanteric fracture with about 1/2  shaft diameter medial displacement.  2. Displaced lesser trochanteric fracture fragment. Acute comminuted  and displaced left intertrochanteric fracture.   IMPRESSION:  No active disease.  ____________________________________________   DIFFERENTIAL DIAGNOSIS   Femur fracture, hip fracture, proximal tibia fracture, arrhythmia, MI  FINAL ASSESSMENT AND PLAN  Fall, left hip fracture   Plan: The patient had presented for fall with resulting left femur fracture. Patient's labs are unremarkable. Patient's imaging revealed a comminuted intertrochanteric left hip fracture.  I have discussed with Dr. Odis Luster as dictated above.  I will discuss with the hospitalist for admission.   Jaime Dash, MD    Note: This note was generated in part or whole with  voice recognition software. Voice recognition is usually quite accurate but there are transcription errors that can and very often do occur. I apologize for any typographical errors that were not detected and corrected.     Emily Filbert, MD 08/21/19 2038

## 2019-08-21 NOTE — ED Notes (Signed)
Pt repeats self often

## 2019-08-21 NOTE — Consult Note (Signed)
Full consult note to follow. Plan left hip nailing tomorrow afternoon if cleared by primary medical team. NPO after midnight.

## 2019-08-22 ENCOUNTER — Inpatient Hospital Stay: Payer: Medicare Other

## 2019-08-22 ENCOUNTER — Encounter
Admission: EM | Disposition: A | Discharge status: Skilled Nursing Facility | Payer: Self-pay | Source: Home / Self Care | Attending: Family Medicine

## 2019-08-22 ENCOUNTER — Encounter: Payer: Self-pay | Admitting: Internal Medicine

## 2019-08-22 ENCOUNTER — Inpatient Hospital Stay: Payer: Medicare Other | Admitting: Anesthesiology

## 2019-08-22 HISTORY — PX: FEMUR IM NAIL: SHX1597

## 2019-08-22 LAB — COMPREHENSIVE METABOLIC PANEL
ALT: 15 U/L (ref 0–44)
AST: 21 U/L (ref 15–41)
Albumin: 3.3 g/dL — ABNORMAL LOW (ref 3.5–5.0)
Alkaline Phosphatase: 72 U/L (ref 38–126)
Anion gap: 7 (ref 5–15)
BUN: 12 mg/dL (ref 8–23)
CO2: 23 mmol/L (ref 22–32)
Calcium: 8.3 mg/dL — ABNORMAL LOW (ref 8.9–10.3)
Chloride: 102 mmol/L (ref 98–111)
Creatinine, Ser: 0.52 mg/dL (ref 0.44–1.00)
GFR calc Af Amer: >60 mL/min (ref >60)
GFR calc non Af Amer: >60 mL/min (ref >60)
Glucose, Bld: 138 mg/dL — ABNORMAL HIGH (ref 70–99)
Potassium: 4.4 mmol/L (ref 3.5–5.1)
Sodium: 132 mmol/L — ABNORMAL LOW (ref 135–145)
Total Bilirubin: 0.9 mg/dL (ref 0.3–1.2)
Total Protein: 5.9 g/dL — ABNORMAL LOW (ref 6.5–8.1)

## 2019-08-22 LAB — CBC
HCT: 32.5 % — ABNORMAL LOW (ref 36.0–46.0)
Hemoglobin: 10.8 g/dL — ABNORMAL LOW (ref 12.0–15.0)
MCH: 30.5 pg (ref 26.0–34.0)
MCHC: 33.2 g/dL (ref 30.0–36.0)
MCV: 91.8 fL (ref 80.0–100.0)
Platelets: 226 10*3/uL (ref 150–400)
RBC: 3.54 MIL/uL — ABNORMAL LOW (ref 3.87–5.11)
RDW: 12.4 % (ref 11.5–15.5)
WBC: 7.6 10*3/uL (ref 4.0–10.5)
nRBC: 0 % (ref 0.0–0.2)

## 2019-08-22 LAB — SARS CORONAVIRUS 2 (TAT 6-24 HRS): SARS Coronavirus 2: NEGATIVE

## 2019-08-22 SURGERY — INSERTION, INTRAMEDULLARY ROD, FEMUR
Anesthesia: General | Laterality: Left

## 2019-08-22 MED ORDER — HYDROMORPHONE HCL 1 MG/ML IJ SOLN
INTRAMUSCULAR | Status: AC
  Filled 2019-08-22: qty 0.5

## 2019-08-22 MED ORDER — ACETAMINOPHEN 325 MG PO TABS
325.0000 mg | ORAL_TABLET | Freq: Four times a day (QID) | ORAL | Status: DC | PRN
  Filled 2019-08-22: qty 2

## 2019-08-22 MED ORDER — IRBESARTAN 150 MG PO TABS
300.0000 mg | ORAL_TABLET | Freq: Every day | ORAL | Status: DC
  Administered 2019-08-23 – 2019-08-24 (×2): 300 mg via ORAL
  Filled 2019-08-22 (×3): qty 2

## 2019-08-22 MED ORDER — BUPIVACAINE HCL (PF) 0.25 % IJ SOLN
INTRAMUSCULAR | Status: AC
  Filled 2019-08-22: qty 30

## 2019-08-22 MED ORDER — DOCUSATE SODIUM 100 MG PO CAPS
100.0000 mg | ORAL_CAPSULE | Freq: Two times a day (BID) | ORAL | Status: DC
  Administered 2019-08-22 – 2019-08-24 (×4): 100 mg via ORAL
  Filled 2019-08-22 (×4): qty 1

## 2019-08-22 MED ORDER — DEXAMETHASONE SODIUM PHOSPHATE 10 MG/ML IJ SOLN
INTRAMUSCULAR | Status: DC | PRN
  Administered 2019-08-22: 5 mg via INTRAVENOUS

## 2019-08-22 MED ORDER — ONDANSETRON HCL 4 MG PO TABS: 4.0000 mg | ORAL_TABLET | Freq: Four times a day (QID) | ORAL | Status: DC | PRN

## 2019-08-22 MED ORDER — VITAMIN B-12 1000 MCG PO TABS
1000.0000 ug | ORAL_TABLET | Freq: Every day | ORAL | Status: DC
  Administered 2019-08-23 – 2019-08-24 (×2): 1000 ug via ORAL
  Filled 2019-08-22 (×3): qty 1

## 2019-08-22 MED ORDER — ACETAMINOPHEN 500 MG PO TABS
500.0000 mg | ORAL_TABLET | Freq: Four times a day (QID) | ORAL | Status: AC
  Administered 2019-08-22 – 2019-08-23 (×4): 500 mg via ORAL
  Filled 2019-08-22 (×4): qty 1

## 2019-08-22 MED ORDER — MIDAZOLAM HCL 2 MG/2ML IJ SOLN
INTRAMUSCULAR | Status: AC
  Filled 2019-08-22: qty 2

## 2019-08-22 MED ORDER — PROPOFOL 500 MG/50ML IV EMUL
INTRAVENOUS | Status: AC
  Filled 2019-08-22: qty 50

## 2019-08-22 MED ORDER — TRANEXAMIC ACID 1000 MG/10ML IV SOLN
2000.0000 mg | Freq: Once | INTRAVENOUS | Status: DC
  Filled 2019-08-22: qty 20

## 2019-08-22 MED ORDER — CHLORHEXIDINE GLUCONATE CLOTH 2 % EX PADS
6.0000 | MEDICATED_PAD | Freq: Every day | CUTANEOUS | Status: DC
  Administered 2019-08-23: 09:00:00 6 via TOPICAL

## 2019-08-22 MED ORDER — MIRTAZAPINE 15 MG PO TABS
15.0000 mg | ORAL_TABLET | Freq: Every day | ORAL | Status: DC
  Administered 2019-08-22 – 2019-08-24 (×3): 15 mg via ORAL
  Filled 2019-08-22 (×4): qty 1

## 2019-08-22 MED ORDER — HYDROMORPHONE HCL 1 MG/ML IJ SOLN
INTRAMUSCULAR | Status: AC
  Filled 2019-08-22: qty 1

## 2019-08-22 MED ORDER — SODIUM CHLORIDE FLUSH 0.9 % IV SOLN
INTRAVENOUS | Status: AC
  Filled 2019-08-22: qty 10

## 2019-08-22 MED ORDER — EPINEPHRINE PF 1 MG/ML IJ SOLN
INTRAMUSCULAR | Status: AC
  Filled 2019-08-22: qty 1

## 2019-08-22 MED ORDER — METOCLOPRAMIDE HCL 5 MG/ML IJ SOLN: 5.0000 mg | Freq: Three times a day (TID) | INTRAMUSCULAR | Status: DC | PRN

## 2019-08-22 MED ORDER — ACETAMINOPHEN 10 MG/ML IV SOLN
INTRAVENOUS | Status: AC
  Filled 2019-08-22: qty 100

## 2019-08-22 MED ORDER — LACTATED RINGERS IV SOLN: INTRAVENOUS | Status: DC

## 2019-08-22 MED ORDER — TRANEXAMIC ACID-NACL 1000-0.7 MG/100ML-% IV SOLN
INTRAVENOUS | Status: DC | PRN
  Administered 2019-08-22: 2000 mg via INTRAVENOUS

## 2019-08-22 MED ORDER — CEFAZOLIN SODIUM-DEXTROSE 2-4 GM/100ML-% IV SOLN
INTRAVENOUS | Status: AC
  Filled 2019-08-22: qty 100

## 2019-08-22 MED ORDER — FENTANYL CITRATE (PF) 100 MCG/2ML IJ SOLN: 25.0000 ug | INTRAMUSCULAR | Status: DC | PRN

## 2019-08-22 MED ORDER — CEFAZOLIN SODIUM-DEXTROSE 2-4 GM/100ML-% IV SOLN
2.0000 g | INTRAVENOUS | Status: AC
  Administered 2019-08-22: 12:00:00 2 g via INTRAVENOUS

## 2019-08-22 MED ORDER — ENOXAPARIN SODIUM 40 MG/0.4ML ~~LOC~~ SOLN
40.0000 mg | SUBCUTANEOUS | Status: DC
  Administered 2019-08-23 – 2019-08-24 (×2): 40 mg via SUBCUTANEOUS
  Filled 2019-08-22 (×2): qty 0.4

## 2019-08-22 MED ORDER — BACITRACIN 50000 UNITS IM SOLR
INTRAMUSCULAR | Status: AC
  Filled 2019-08-22: qty 1

## 2019-08-22 MED ORDER — LEVOTHYROXINE SODIUM 137 MCG PO TABS
137.0000 ug | ORAL_TABLET | Freq: Every day | ORAL | Status: DC
  Administered 2019-08-23 – 2019-08-24 (×3): 137 ug via ORAL
  Filled 2019-08-22 (×4): qty 1

## 2019-08-22 MED ORDER — FENTANYL CITRATE (PF) 100 MCG/2ML IJ SOLN
INTRAMUSCULAR | Status: DC | PRN
  Administered 2019-08-22: 50 ug via INTRAVENOUS

## 2019-08-22 MED ORDER — LIDOCAINE HCL (CARDIAC) PF 100 MG/5ML IV SOSY
PREFILLED_SYRINGE | INTRAVENOUS | Status: DC | PRN
  Administered 2019-08-22: 60 mg via INTRAVENOUS

## 2019-08-22 MED ORDER — LACTATED RINGERS IV SOLN: INTRAVENOUS | Status: DC | PRN

## 2019-08-22 MED ORDER — FENTANYL CITRATE (PF) 100 MCG/2ML IJ SOLN
INTRAMUSCULAR | Status: AC
  Filled 2019-08-22: qty 2

## 2019-08-22 MED ORDER — HYDROCODONE-ACETAMINOPHEN 5-325 MG PO TABS
1.0000 | ORAL_TABLET | ORAL | Status: DC | PRN
  Administered 2019-08-23 – 2019-08-24 (×4): 1 via ORAL
  Filled 2019-08-22 (×4): qty 1

## 2019-08-22 MED ORDER — KETOROLAC TROMETHAMINE 15 MG/ML IJ SOLN
7.5000 mg | Freq: Four times a day (QID) | INTRAMUSCULAR | Status: AC
  Administered 2019-08-23 (×3): 7.5 mg via INTRAVENOUS
  Filled 2019-08-22 (×4): qty 1

## 2019-08-22 MED ORDER — ONDANSETRON HCL 4 MG/2ML IJ SOLN: 4.0000 mg | Freq: Four times a day (QID) | INTRAMUSCULAR | Status: DC | PRN

## 2019-08-22 MED ORDER — BUPIVACAINE-EPINEPHRINE (PF) 0.25% -1:200000 IJ SOLN
INTRAMUSCULAR | Status: DC | PRN
  Administered 2019-08-22: 20 mL

## 2019-08-22 MED ORDER — PROPOFOL 10 MG/ML IV BOLUS
INTRAVENOUS | Status: DC | PRN
  Administered 2019-08-22: 50 mg via INTRAVENOUS

## 2019-08-22 MED ORDER — VITAMIN D 25 MCG (1000 UNIT) PO TABS
2000.0000 [IU] | ORAL_TABLET | Freq: Every day | ORAL | Status: DC
  Administered 2019-08-23 – 2019-08-24 (×2): 2000 [IU] via ORAL
  Filled 2019-08-22 (×2): qty 2

## 2019-08-22 MED ORDER — METOCLOPRAMIDE HCL 10 MG PO TABS: 5.0000 mg | ORAL_TABLET | Freq: Three times a day (TID) | ORAL | Status: DC | PRN

## 2019-08-22 MED ORDER — SODIUM CHLORIDE 0.9 % IR SOLN
Status: DC | PRN
  Administered 2019-08-22: 1000 mL

## 2019-08-22 MED ORDER — ACETAMINOPHEN 10 MG/ML IV SOLN
INTRAVENOUS | Status: DC | PRN
  Administered 2019-08-22: 1000 mg via INTRAVENOUS

## 2019-08-22 MED ORDER — SUGAMMADEX SODIUM 200 MG/2ML IV SOLN
INTRAVENOUS | Status: DC | PRN
  Administered 2019-08-22: 150 mg via INTRAVENOUS

## 2019-08-22 MED ORDER — KETOROLAC TROMETHAMINE 15 MG/ML IJ SOLN
INTRAMUSCULAR | Status: AC
  Administered 2019-08-22: 16:00:00 7.5 mg via INTRAVENOUS
  Filled 2019-08-22: qty 1

## 2019-08-22 MED ORDER — SODIUM CHLORIDE 0.9 % IV SOLN: INTRAVENOUS | Status: DC | PRN

## 2019-08-22 MED ORDER — ROCURONIUM BROMIDE 100 MG/10ML IV SOLN
INTRAVENOUS | Status: DC | PRN
  Administered 2019-08-22 (×2): 10 mg via INTRAVENOUS
  Administered 2019-08-22: 40 mg via INTRAVENOUS

## 2019-08-22 MED ORDER — KETAMINE HCL 50 MG/ML IJ SOLN
INTRAMUSCULAR | Status: AC
  Filled 2019-08-22: qty 10

## 2019-08-22 SURGICAL SUPPLY — 43 items
BIT DRILL 4.2 (DRILL) ×1 IMPLANT
BIT DRILL CAN STP 6MM/9MM HIP (BIT) ×1
BIT DRILL CANN 16 HIP (BIT) ×2 IMPLANT
BIT DRILL CANN 16MM HIP (BIT) ×1
BIT DRILL CANN STP 6/9 HIP (BIT) ×2 IMPLANT
BLADE TFNA HELICAL 95 STRL (Anchor) ×3 IMPLANT
BNDG COHESIVE 4X5 TAN STRL (GAUZE/BANDAGES/DRESSINGS) IMPLANT
BRUSH SCRUB EZ  4% CHG (MISCELLANEOUS) ×4
BRUSH SCRUB EZ 4% CHG (MISCELLANEOUS) ×2 IMPLANT
CANISTER SUCT 1200ML W/VALVE (MISCELLANEOUS) ×3 IMPLANT
CHLORAPREP W/TINT 26 (MISCELLANEOUS) ×3 IMPLANT
COVER WAND RF STERILE (DRAPES) ×3 IMPLANT
DRAPE 3/4 80X56 (DRAPES) ×3 IMPLANT
DRAPE U-SHAPE 47X51 STRL (DRAPES) ×3 IMPLANT
DRILL 4.2 (DRILL) ×3
DRSG AQUACEL AG ADV 3.5X 4 (GAUZE/BANDAGES/DRESSINGS) IMPLANT
DRSG AQUACEL AG ADV 3.5X10 (GAUZE/BANDAGES/DRESSINGS) ×3 IMPLANT
DRSG OPSITE POSTOP 3X4 (GAUZE/BANDAGES/DRESSINGS) ×3 IMPLANT
ELECT REM PT RETURN 9FT ADLT (ELECTROSURGICAL) ×3
ELECTRODE REM PT RTRN 9FT ADLT (ELECTROSURGICAL) ×1 IMPLANT
GAUZE XEROFORM 1X8 LF (GAUZE/BANDAGES/DRESSINGS) ×3 IMPLANT
GLOVE INDICATOR 8.0 STRL GRN (GLOVE) ×3 IMPLANT
GLOVE SURG ORTHO 8.0 STRL STRW (GLOVE) ×3 IMPLANT
GOWN STRL REUS W/ TWL LRG LVL3 (GOWN DISPOSABLE) ×1 IMPLANT
GOWN STRL REUS W/ TWL XL LVL3 (GOWN DISPOSABLE) ×1 IMPLANT
GOWN STRL REUS W/TWL LRG LVL3 (GOWN DISPOSABLE) ×2
GOWN STRL REUS W/TWL XL LVL3 (GOWN DISPOSABLE) ×2
GUIDEWIRE 3.2X400 (WIRE) ×6 IMPLANT
KIT PATIENT CARE HANA TABLE (KITS) ×3 IMPLANT
KIT TURNOVER CYSTO (KITS) ×3 IMPLANT
MAT ABSORB  FLUID 56X50 GRAY (MISCELLANEOUS) ×2
MAT ABSORB FLUID 56X50 GRAY (MISCELLANEOUS) ×1 IMPLANT
NAIL CANN TFNA 9 130D 380 (Nail) ×3 IMPLANT
NS IRRIG 1000ML POUR BTL (IV SOLUTION) ×3 IMPLANT
PACK HIP COMPR (MISCELLANEOUS) ×3 IMPLANT
REAMER ROD DEEP FLUTE 2.5X950 (INSTRUMENTS) ×3 IMPLANT
SCREW LOCKING 5.0MX50M (Screw) ×3 IMPLANT
SCREW LOCKING 5.0X48MM (Screw) ×3 IMPLANT
STAPLER SKIN PROX 35W (STAPLE) ×3 IMPLANT
SUT VIC AB 0 CT1 36 (SUTURE) ×3 IMPLANT
SUT VIC AB 2-0 CT1 27 (SUTURE) ×2
SUT VIC AB 2-0 CT1 TAPERPNT 27 (SUTURE) ×1 IMPLANT
TOWEL OR 17X26 4PK STRL BLUE (TOWEL DISPOSABLE) ×3 IMPLANT

## 2019-08-22 NOTE — Transfer of Care (Signed)
Immediate Anesthesia Transfer of Care Note  Patient: Jaime [name]  Procedure(s) Performed: INTRAMEDULLARY (IM) NAIL FEMORAL, LEFT (Left )  Patient Location: PACU  Anesthesia Type:General  Level of Consciousness: drowsy  Airway & Oxygen Therapy: Patient Spontanous Breathing and Patient connected to face mask oxygen  Post-op Assessment: Report given to RN, Post -op Vital signs reviewed and stable and Patient moving all extremities  Post vital signs: Reviewed and stable  Last Vitals:  Vitals Value Taken Time  BP 152/59 08/22/19 1413  Temp 36.3 C 08/22/19 1411  Pulse 63 08/22/19 1413  Resp 15 08/22/19 1413  SpO2 100 % 08/22/19 1413    Last Pain:  Vitals:   08/22/19 1411  TempSrc:   PainSc: Asleep         Complications: No apparent anesthesia complications

## 2019-08-22 NOTE — Op Note (Signed)
DATE OF SURGERY:  08/22/2019  TIME: 2:17 PM  PATIENT NAME:  Jaime [name]  AGE: 86 y.o.  PRE-OPERATIVE DIAGNOSIS:  left hip closed, displaced sub-trochanteric femur fracture  POST-OPERATIVE DIAGNOSIS:  SAME  PROCEDURE:  INTRAMEDULLARY (IM) NAIL FEMORAL, LEFT WITH TWO DISTAL LOCKING BOLTS  SURGEON:  Lyndle Herrlich  EBL:  100 cc  COMPLICATIONS:  None apparent  OPERATIVE IMPLANTS: Synthes trochanteric femoral nail  380 mm by 9 mm  with interlocking helical blade  95 mm and two distal locking bolts 5 mm by 48 mm and 50 mm  PREOPERATIVE INDICATIONS:  Jaime [name] is a 85 y.o. year old who fell and suffered a hip fracture. She was brought into the ER and then admitted and optimized and then elected for surgical intervention.    The risks benefits and alternatives were discussed with the patient including but not limited to the risks of nonoperative treatment, versus surgical intervention including infection, bleeding, nerve injury, malunion, nonunion, hardware prominence, hardware failure, need for hardware removal, blood clots, cardiopulmonary complications, morbidity, mortality, among others, and they were willing to proceed.    OPERATIVE PROCEDURE:  The patient was brought to the operating room and placed in the supine position.  General anesthesia was administered, with a foley. She was placed on the fracture table.  Closed reduction was performed under C-arm guidance. The length of the femur was also measured using fluoroscopy. Time out was then performed after sterile prep and drape. She received preoperative antibiotics.  Incision was made proximal to the greater trochanter. A guidewire was placed in the appropriate position. Confirmation was made on AP and lateral views. The above-named nail was opened. I opened the proximal femur with a reamer. I then placed the nail by hand easily down. I did not need to ream the femur.  Once the nail was completely seated, I  placed a guidepin into the femoral head into the center center position through a second incision.  I measured the length, and then reamed the lateral cortex and up into the head. I then placed the helical blade. Slight compression was applied. Anatomic fixation achieved. Bone quality was poor.  I then secured the proximal interlock.   Next, using a perfect circle technique, two distal locking bolts were placed, given the inherent fracture instability.  I then removed the instruments, and took final C-arm pictures AP and lateral the entire length of the leg. Anatomic reconstruction was achieved, and the wounds were irrigated copiously and closed with Vicryl  followed by staples and dry sterile dressing. Sponge and needle count were correct.   The patient was awakened and returned to PACU in stable and satisfactory condition. There no complications and the patient tolerated the procedure well.  She will be weightbearing as tolerated.    Lyndle Herrlich

## 2019-08-22 NOTE — ED Notes (Signed)
PT'S DAUGHTER [name] Sundstrom IS REACHABLE AT [phone]

## 2019-08-22 NOTE — Consult Note (Signed)
ORTHOPAEDIC CONSULTATION  REQUESTING PHYSICIAN: Edwin Dada, *  Chief Complaint: left hip pain  HPI: Jaime [name] is a 86 y.o. female who complains of left hip pain. Please see H&P and ED notes for details. Denies any numbness, tingling or constitutional symptoms.  Past Medical History:  Diagnosis Date  . Anxiety   . Depression   . Hypertension   . Hypothyroidism    Past Surgical History:  Procedure Laterality Date  . ABDOMINAL HYSTERECTOMY    . appendectomy    . MINOR HEMORRHOIDECTOMY    . OPEN REDUCTION INTERNAL FIXATION (ORIF) DISTAL RADIAL FRACTURE Left 09/11/2015   Procedure: OPEN REDUCTION INTERNAL FIXATION (ORIF) DISTAL RADIAL FRACTURE;  Surgeon: Hessie Knows, MD;  Location: [facility] ORS;  Service: Orthopedics;  Laterality: Left;  . TONSILLECTOMY    . TUBAL LIGATION     Social History   Socioeconomic History  . Marital status: Widowed    Spouse name: Not on file  . Number of children: Not on file  . Years of education: Not on file  . Highest education level: Not on file  Occupational History  . Not on file  Tobacco Use  . Smoking status: Never Smoker  . Smokeless tobacco: Never Used  Substance and Sexual Activity  . Alcohol use: Not Currently  . Drug use: Never  . Sexual activity: Not on file  Other Topics Concern  . Not on file  Social History Narrative  . Not on file   Social Determinants of Health   Financial Resource Strain:   . Difficulty of Paying Living Expenses: Not on file  Food Insecurity:   . Worried About Charity fundraiser in the Last Year: Not on file  . Ran Out of Food in the Last Year: Not on file  Transportation Needs:   . Lack of Transportation (Medical): Not on file  . Lack of Transportation (Non-Medical): Not on file  Physical Activity:   . Days of Exercise per Week: Not on file  . Minutes of Exercise per Session: Not on file  Stress:   . Feeling of Stress : Not on file  Social Connections:   . Frequency of  Communication with Friends and Family: Not on file  . Frequency of Social Gatherings with Friends and Family: Not on file  . Attends Religious Services: Not on file  . Active Member of Clubs or Organizations: Not on file  . Attends Archivist Meetings: Not on file  . Marital Status: Not on file   Family History  Problem Relation Age of Onset  . Breast cancer Maternal Aunt        2 mat aunts   Allergies  Allergen Reactions  . Memantine Other (See Comments)    Worsened confusion  . Donepezil Other (See Comments)    Poor appetite, weight loss   Prior to Admission medications   Medication Sig Start Date End Date Taking? Authorizing Provider  Cholecalciferol 50 MCG (2000 UT) CAPS Take 2,000 Units by mouth daily.   Yes [provider]  cyanocobalamin 1000 MCG tablet Take 1,000 mcg by mouth daily.    Yes [provider]  levothyroxine (SYNTHROID, LEVOTHROID) 137 MCG tablet Take 137 mcg by mouth daily before breakfast.   Yes [provider]  mirtazapine (REMERON) 15 MG tablet Take 15 mg by mouth at bedtime. 06/16/19  Yes [provider]  telmisartan (MICARDIS) 80 MG tablet Take 80 mg by mouth daily.   Yes [provider]  DG Chest 1 View  Result Date: 08/21/2019 CLINICAL DATA:  Fall with femur fracture EXAM: CHEST  1 VIEW COMPARISON:  04/19/2018 FINDINGS: No focal airspace disease or effusion. Stable cardiomediastinal silhouette with aortic atherosclerosis. No pneumothorax. IMPRESSION: No active disease. Electronically Signed   By: Jasmine Pang M.D.   On: 08/21/2019 20:25   DG Femur Portable Min 2 Views Left  Result Date: 08/21/2019 CLINICAL DATA:  Fall with left leg pain EXAM: LEFT FEMUR PORTABLE 2 VIEWS COMPARISON:  None. FINDINGS: Acute comminuted left intertrochanteric fracture with about 1/2 shaft diameter medial displacement of the shaft. Displaced lesser trochanteric fracture fragment. Linear fracture lucency extends to the base  of the femoral neck and the fracture also involves the subtrochanteric shaft of the femur. IMPRESSION: 1. Acute comminuted left intertrochanteric fracture with about 1/2 shaft diameter medial displacement. 2. Displaced lesser trochanteric fracture fragment. Acute comminuted and displaced left intertrochanteric fracture. Electronically Signed   By: Jasmine Pang M.D.   On: 08/21/2019 20:25    Positive ROS: All other systems have been reviewed and were otherwise negative with the exception of those mentioned in the HPI and as above.  Physical Exam: General: Alert, no acute distress Cardiovascular: No pedal edema Respiratory: No cyanosis, no use of accessory musculature GI: No organomegaly, abdomen is soft and non-tender Skin: No lesions in the area of chief complaint Neurologic: Sensation intact distally Psychiatric: Patient is competent for consent with normal mood and affect Lymphatic: No axillary or cervical lymphadenopathy  MUSCULOSKELETAL: left leg short, externally rotated. Compartments soft. Good cap refill. Motor and sensory intact distally.  Assessment: Left femur subtrochanteric fracture  Plan: Plan a long trochanteric femoral nail, left hip.  The diagnosis, risks, benefits and alternatives to treatment are all discussed in detail with the patient and family. Risks include but are not limited to bleeding, infection, deep vein thrombosis, pulmonary embolism, nerve or vascular injury, non-union, repeat operation, persistent pain, weakness, stiffness and death. She understands and is eager to proceed.     Lyndle Herrlich, MD    08/22/2019 11:44 AM

## 2019-08-22 NOTE — ED Notes (Signed)
Pt transferred to [facility] with RN

## 2019-08-22 NOTE — ED Notes (Signed)
Pt reports no relief from pain medication

## 2019-08-22 NOTE — ED Notes (Signed)
Pt reports relief from pain, "I feel much better now"

## 2019-08-22 NOTE — Progress Notes (Signed)
Patient up from the ED with PureWick in place. Patients bladder is distended. Bladder scan shows of urine. Spoke with Dr Randa Ngo verbal order for foley.

## 2019-08-22 NOTE — ED Notes (Signed)
Report to stephanie, rn

## 2019-08-22 NOTE — Anesthesia Procedure Notes (Signed)
Procedure Name: Intubation Date/Time: 08/22/2019 12:10 PM Performed by: Lorie Apley, CRNA Pre-anesthesia Checklist: Patient identified, Patient being monitored, Timeout performed, Emergency Drugs available and Suction available Patient Re-evaluated:Patient Re-evaluated prior to induction Oxygen Delivery Method: Circle system utilized Preoxygenation: Pre-oxygenation with 100% oxygen Induction Type: IV induction Ventilation: Mask ventilation without difficulty Laryngoscope Size: Mac and 3 Grade View: Grade I Tube type: Oral Tube size: 7.0 mm Number of attempts: 1 Airway Equipment and Method: Stylet Placement Confirmation: ETT inserted through vocal cords under direct vision,  positive ETCO2 and breath sounds checked- equal and bilateral Secured at: 21 cm Tube secured with: Tape Dental Injury: Teeth and Oropharynx as per pre-operative assessment

## 2019-08-22 NOTE — Anesthesia Preprocedure Evaluation (Signed)
Anesthesia Evaluation  Patient identified by MRN, date of birth, ID band Patient awake and Patient confused    Reviewed: Allergy & Precautions, H&P , NPO status , Patient's Chart, lab work & pertinent test results  Airway Mallampati: III  TM Distance: >3 FB Neck ROM: limited    Dental  (+) Chipped, Partial Upper, Poor Dentition, Missing   Pulmonary neg pulmonary ROS, neg COPD,           Cardiovascular hypertension, (-) angina(-) DOE      Neuro/Psych PSYCHIATRIC DISORDERS negative neurological ROS     GI/Hepatic negative GI ROS, Neg liver ROS,   Endo/Other  Hypothyroidism   Renal/GU      Musculoskeletal   Abdominal   Peds  Hematology negative hematology ROS (+)   Anesthesia Other Findings Past Medical History: No date: Anxiety No date: Depression No date: Hypertension No date: Hypothyroidism  Past Surgical History: No date: ABDOMINAL HYSTERECTOMY No date: appendectomy No date: MINOR HEMORRHOIDECTOMY 09/11/2015: OPEN REDUCTION INTERNAL FIXATION (ORIF) DISTAL RADIAL  FRACTURE; Left     Comment:  Procedure: OPEN REDUCTION INTERNAL FIXATION (ORIF)               DISTAL RADIAL FRACTURE;  Surgeon: Kennedy Bucker, MD;                Location: [facility] ORS;  Service: Orthopedics;  Laterality:               Left; No date: TONSILLECTOMY No date: TUBAL LIGATION  BMI    Body Mass Index: 25.39 kg/m      Reproductive/Obstetrics negative OB ROS                             Anesthesia Physical Anesthesia Plan  ASA: III  Anesthesia Plan: Spinal   Post-op Pain Management:    Induction:   PONV Risk Score and Plan:   Airway Management Planned: Natural Airway and Nasal Cannula  Additional Equipment:   Intra-op Plan:   Post-operative Plan:   Informed Consent: I have reviewed the patients History and Physical, chart, labs and discussed the procedure including the risks, benefits and  alternatives for the proposed anesthesia with the patient or authorized representative who has indicated his/her understanding and acceptance.     Dental Advisory Given  Plan Discussed with: Anesthesiologist, CRNA and Surgeon  Anesthesia Plan Comments: (History and phone consent from the patients daughter Tamecia Mcdougald at [phone]  Daughter reports no bleeding problems and no home anticoagulant use.  Plan for spinal with backup GA  Daughter consented for risks of anesthesia including but not limited to:  - adverse reactions to medications - risk of bleeding, infection, nerve damage and headache - risk of failed spinal - damage to teeth, lips or other oral mucosa - sore throat or hoarseness - Damage to heart, brain, lungs or loss of life  Daughter voiced understanding.)        Anesthesia Quick Evaluation

## 2019-08-22 NOTE — Progress Notes (Signed)
PROGRESS NOTE    Jaime [name]  OFB:[id number] DOB: 11-Oct-1932 DOA: 08/21/2019 PCP: Ezequiel Kayser, MD      Brief Narrative:  Jaime [name] is a 86 y.o. F with dementia, HTN, hypothyroidism, depression, who presented with mechanical fall and left hip pain.  In the ER, radiograph showed femoral neck fracture on the left, she was hypertensive, hemodynamically stable otherwise.  Orthopedics were consulted recommended repair of the fracture.        Assessment & Plan:  Hip fracture -Pain control per protocol -PT eval -Consult orthopedics, appreciate recommendations   Hypertension Blood pressure well controlled postop -Continue irbesartan  Hypothyroidism -Continue levothyroxine  Depression/dementia -Continue mirtazapine  Hyponatremia Mild, asymptomatic -Trend BMP  Anemia Mild -Trend hemoglobin postop      MDM and disposition: The below labs and imaging reports were reviewed and summarized above.  Medication management as above.  The patient was admitted with hip fracture.     Barriers to discharge: PT evaluation, SNF       DVT prophylaxis: Lovenox Code Status: Full code Family Communication:     Consultants:   Orthopedic  Procedures:   1/4 ORIF, left hip by Dr. Harlow Mares  Antimicrobials:       Subjective: Patient has no chest pain, dyspnea, nausea, diarrhea, urinary dysfunction.  Objective: Vitals:   08/22/19 1721 08/22/19 1758 08/22/19 1911 08/22/19 2006  BP: (!) 142/56 (!) 141/54 (!) 138/55 (!) 133/50  Pulse: 78 80 74 75  Resp:   16 16  Temp: 98.2 F (36.8 C) 98 F (36.7 C) 98.3 F (36.8 C) 98.6 F (37 C)  TempSrc: Oral Oral Oral Oral  SpO2: 97% 98% 96% 96%  Weight:      Height:        Intake/Output Summary (Last 24 hours) at 08/22/2019 2029 Last data filed at 08/22/2019 1650 Gross per 24 hour  Intake 1600 ml  Output 1170 ml  Net 430 ml   Filed Weights   08/21/19 1937 08/22/19 1056  Weight: 75.8 kg 75.8 kg     Examination: General appearance: Thin elderly adult female, alert and in no acute distress.   HEENT: Anicteric, conjunctiva pink, lids and lashes normal. No nasal deformity, discharge, epistaxis.  Lips moist.   Skin: Warm and dry.  No jaundice.  No suspicious rashes or lesions. Cardiac: RRR, nl S1-S2, no murmurs appreciated.  Capillary refill is brisk.  JVP normal.  No LE edema.  Radial  pulses 2+ and symmetric. Respiratory: Normal respiratory rate and Jaime.  CTAB without rales or wheezes. Abdomen: Abdomen soft.  No TTP or guarding. No ascites, distension, hepatosplenomegaly.   MSK: No deformities or effusions. Neuro: Awake and alert.  EOMI, moves upper extremities normally, lower extremity strength not tested due to repair. Speech fluent.    Psych: Sensorium intact and responding to questions, attention normal. Affect normal.  Judgment and insight appear mildly impaired.    Data Reviewed: I have personally reviewed following labs and imaging studies:  CBC: Recent Labs  Lab 08/21/19 1941 08/22/19 0413  WBC 6.4 7.6  NEUTROABS 4.1  --   HGB 12.8 10.8*  HCT 38.7 32.5*  MCV 92.8 91.8  PLT 161 782   Basic Metabolic Panel: Recent Labs  Lab 08/21/19 2110 08/22/19 0413  NA 131* 132*  K 4.3 4.4  CL 98 102  CO2 24 23  GLUCOSE 123* 138*  BUN 13 12  CREATININE 0.68 0.52  CALCIUM 8.9 8.3*   GFR: Estimated Creatinine Clearance: 52.8 mL/min (by  C-G formula based on SCr of 0.52 mg/dL). Liver Function Tests: Recent Labs  Lab 08/21/19 2110 08/22/19 0413  AST 24 21  ALT 17 15  ALKPHOS 85 72  BILITOT 0.5 0.9  PROT 6.8 5.9*  ALBUMIN 3.7 3.3*   No results for input(s): LIPASE, AMYLASE in the last 168 hours. No results for input(s): AMMONIA in the last 168 hours. Coagulation Profile: No results for input(s): INR, PROTIME in the last 168 hours. Cardiac Enzymes: No results for input(s): CKTOTAL, CKMB, CKMBINDEX, TROPONINI in the last 168 hours. BNP (last 3 results) No  results for input(s): PROBNP in the last 8760 hours. HbA1C: No results for input(s): HGBA1C in the last 72 hours. CBG: No results for input(s): GLUCAP in the last 168 hours. Lipid Profile: No results for input(s): CHOL, HDL, LDLCALC, TRIG, CHOLHDL, LDLDIRECT in the last 72 hours. Thyroid Function Tests: No results for input(s): TSH, T4TOTAL, FREET4, T3FREE, THYROIDAB in the last 72 hours. Anemia Panel: No results for input(s): VITAMINB12, FOLATE, FERRITIN, TIBC, IRON, RETICCTPCT in the last 72 hours. Urine analysis:    Component Value Date/Time   COLORURINE YELLOW (A) 04/19/2018 0728   APPEARANCEUR CLEAR (A) 04/19/2018 0728   LABSPEC 1.013 04/19/2018 0728   PHURINE 7.0 04/19/2018 0728   GLUCOSEU NEGATIVE 04/19/2018 0728   HGBUR SMALL (A) 04/19/2018 0728   BILIRUBINUR NEGATIVE 04/19/2018 0728   KETONESUR NEGATIVE 04/19/2018 0728   PROTEINUR NEGATIVE 04/19/2018 0728   NITRITE NEGATIVE 04/19/2018 0728   LEUKOCYTESUR NEGATIVE 04/19/2018 0728   Sepsis Labs: @LABRCNTIP (procalcitonin:4,lacticacidven:4)  ) Recent Results (from the past 240 hour(s))  SARS CORONAVIRUS 2 (TAT 6-24 HRS) Nasopharyngeal Nasopharyngeal Swab     Status: None   Collection Time: 08/21/19  7:41 PM   Specimen: Nasopharyngeal Swab  Result Value Ref Range Status   SARS Coronavirus 2 NEGATIVE NEGATIVE Final    Comment: (NOTE) SARS-CoV-2 target nucleic acids are NOT DETECTED. The SARS-CoV-2 RNA is generally detectable in upper and lower respiratory specimens during the acute phase of infection. Negative results do not preclude SARS-CoV-2 infection, do not rule out co-infections with other pathogens, and should not be used as the sole basis for treatment or other patient management decisions. Negative results must be combined with clinical observations, patient history, and epidemiological information. The expected result is Negative. Fact Sheet for Patients: 10/19/19 Fact  Sheet for Healthcare Providers: HairSlick.no This test is not yet approved or cleared by the quierodirigir.com FDA and  has been authorized for detection and/or diagnosis of SARS-CoV-2 by FDA under an Emergency Use Authorization (EUA). This EUA will remain  in effect (meaning this test can be used) for the duration of the COVID-19 declaration under Section 56 4(b)(1) of the Act, 21 U.S.C. section 360bbb-3(b)(1), unless the authorization is terminated or revoked sooner. Performed at [facility], [address], [place], [place]          Radiology Studies: DG Chest 1 View  Result Date: 08/21/2019 CLINICAL DATA:  Fall with femur fracture EXAM: CHEST  1 VIEW COMPARISON:  04/19/2018 FINDINGS: No focal airspace disease or effusion. Stable cardiomediastinal silhouette with aortic atherosclerosis. No pneumothorax. IMPRESSION: No active disease. Electronically Signed   By: 06/19/2018 M.D.   On: 08/21/2019 20:25   DG HIP OPERATIVE UNILAT W OR W/O PELVIS LEFT  Result Date: 08/22/2019 CLINICAL DATA:  Proximal left femur fracture. EXAM: OPERATIVE LEFT HIP (WITH PELVIS IF PERFORMED) 2 VIEWS TECHNIQUE: Fluoroscopic spot image(s) were submitted for interpretation post-operatively. COMPARISON:  RADIOGRAPHS DATED 08/21/2019 FINDINGS: AP and lateral C-arm images demonstrate the patient has undergone open reduction and internal fixation of comminuted proximal left femur fracture. Intramedullary nail and lag screw in place. Marked improvement in the alignment and position of the major fracture fragments. The lesser trochanter remains displaced. IMPRESSION: Open reduction and internal fixation of comminuted proximal left femur fracture. Marked improvement in the alignment and position of the major fracture fragments. Electronically Signed   By: Francene Boyers M.D.   On: 08/22/2019 13:58   DG Femur Portable Min 2 Views Left  Result Date: 08/21/2019 CLINICAL DATA:  Fall  with left leg pain EXAM: LEFT FEMUR PORTABLE 2 VIEWS COMPARISON:  None. FINDINGS: Acute comminuted left intertrochanteric fracture with about 1/2 shaft diameter medial displacement of the shaft. Displaced lesser trochanteric fracture fragment. Linear fracture lucency extends to the base of the femoral neck and the fracture also involves the subtrochanteric shaft of the femur. IMPRESSION: 1. Acute comminuted left intertrochanteric fracture with about 1/2 shaft diameter medial displacement. 2. Displaced lesser trochanteric fracture fragment. Acute comminuted and displaced left intertrochanteric fracture. Electronically Signed   By: Jasmine Pang M.D.   On: 08/21/2019 20:25        Scheduled Meds: . acetaminophen  500 mg Oral Q6H  . Chlorhexidine Gluconate Cloth  6 each Topical Daily  . cholecalciferol  2,000 Units Oral Daily  . docusate sodium  100 mg Oral BID  . [START ON 08/23/2019] enoxaparin (LOVENOX) injection  40 mg Subcutaneous Q24H  . irbesartan  300 mg Oral Daily  . ketorolac  7.5 mg Intravenous Q6H  . levothyroxine  137 mcg Oral QAC breakfast  . mirtazapine  15 mg Oral QHS  . tranexamic acid (CYKLOKAPRON) topical -INTRAOP  2,000 mg Topical Once  . cyanocobalamin  1,000 mcg Oral Daily   Continuous Infusions: . lactated ringers 75 mL/hr at 08/22/19 1828     LOS: 1 day    Time spent: 25 minutes    Alberteen Sam, MD [facility] 08/22/2019, 8:29 PM     Please page though AMION or Epic secure chat:  For [employer], Higher education careers adviser

## 2019-08-22 NOTE — Progress Notes (Signed)
Contact daughter [name]) for any information needed. Per daughter patient has dementia. Currently patient only a&o x2. Jaime [name]

## 2019-08-23 DIAGNOSIS — E039 Hypothyroidism, unspecified: Secondary | ICD-10-CM

## 2019-08-23 NOTE — NC FL2 (Signed)
[place] LEVEL OF CARE SCREENING TOOL     IDENTIFICATION  Patient Name: Jaime [name] Birthdate: 1933-07-19 Sex: female Admission Date (Current Location): 08/21/2019  [place] and [place] Number:  Engineering geologist and Address:  [facility], [address], [place], [place] [address]      Provider Number: [id number]  Attending Physician Name and Address:  Edwin Dada, *  Relative Name and Phone Number:  [name] [phone]    Current Level of Care: Hospital Recommended Level of Care: [place] Prior Approval Number:    Date Approved/Denied:   PASRR Number: [id number]  Discharge Plan: SNF    Current Diagnoses: Patient Active Problem List   Diagnosis Date Noted  . Intertrochanteric fracture ([place]) 08/21/2019  . Hypothyroidism 08/21/2019  . Protein-calorie malnutrition, severe 04/20/2018  . Dementia with behavioral disturbance ([place]) 04/20/2018  . Acute encephalopathy 04/19/2018    Orientation RESPIRATION BLADDER Height & Weight     Self  Normal Indwelling catheter Weight: 75.8 kg Height:  5\' 8"  (172.7 cm)  BEHAVIORAL SYMPTOMS/MOOD NEUROLOGICAL BOWEL NUTRITION STATUS      Continent Diet  AMBULATORY STATUS COMMUNICATION OF NEEDS Skin   Extensive Assist Verbally Surgical wounds                       Personal Care Assistance Level of Assistance              Functional Limitations Info             SPECIAL CARE FACTORS FREQUENCY  PT (By licensed PT)     PT Frequency: 5 days per week              Contractures Contractures Info: Not present    Additional Factors Info  Code Status, Allergies Code Status Info: full Allergies Info: Memantine, Donepezil           Current Medications (08/23/2019):  This is the current hospital active medication list Current Facility-Administered Medications  Medication Dose Route Frequency Provider Last Rate Last Admin  .  acetaminophen (TYLENOL) tablet 650 mg  650 mg Oral Q6H PRN Lovell Sheehan, MD   650 mg at 08/23/19 0847   Or  . acetaminophen (TYLENOL) suppository 650 mg  650 mg Rectal Q6H PRN Lovell Sheehan, MD      . acetaminophen (TYLENOL) tablet 325-650 mg  325-650 mg Oral Q6H PRN Lovell Sheehan, MD      . Chlorhexidine Gluconate Cloth 2 % PADS 6 each  6 each Topical Daily Danford, Suann Larry, MD   6 each at 08/23/19 [phone]  . cholecalciferol (VITAMIN D3) tablet 2,000 Units  2,000 Units Oral Daily Lovell Sheehan, MD   2,000 Units at 08/23/19 [phone]  . docusate sodium (COLACE) capsule 100 mg  100 mg Oral BID Lovell Sheehan, MD   100 mg at 08/23/19 0846  . enoxaparin (LOVENOX) injection 40 mg  40 mg Subcutaneous Q24H Lovell Sheehan, MD   40 mg at 08/23/19 0849  . hydrALAZINE (APRESOLINE) injection 5 mg  5 mg Intravenous Q6H PRN Lovell Sheehan, MD      . HYDROcodone-acetaminophen (NORCO/VICODIN) 5-325 MG per tablet 1-2 tablet  1-2 tablet Oral Q4H PRN Lovell Sheehan, MD   1 tablet at 08/23/19 0944  . HYDROmorphone (DILAUDID) injection 0.5 mg  0.5 mg Intravenous Q4H PRN Lovell Sheehan, MD   0.25 mg at 08/22/19 1405  . irbesartan (AVAPRO)  tablet 300 mg  300 mg Oral Daily Lyndle Herrlich, MD   300 mg at 08/23/19 0848  . lactated ringers infusion   Intravenous Continuous Lyndle Herrlich, MD 75 mL/hr at 08/22/19 2216 New Bag at 08/22/19 2216  . levothyroxine (SYNTHROID) tablet 137 mcg  137 mcg Oral QAC breakfast Lyndle Herrlich, MD   137 mcg at 08/23/19 [phone]  . metoCLOPramide (REGLAN) tablet 5-10 mg  5-10 mg Oral Q8H PRN Lyndle Herrlich, MD       Or  . metoCLOPramide (REGLAN) injection 5-10 mg  5-10 mg Intravenous Q8H PRN Lyndle Herrlich, MD      . mirtazapine (REMERON) tablet 15 mg  15 mg Oral QHS Lyndle Herrlich, MD   15 mg at 08/22/19 2200  . ondansetron (ZOFRAN) injection 4 mg  4 mg Intravenous Q6H PRN Lyndle Herrlich, MD      . ondansetron [facility]) tablet 4 mg  4 mg Oral Q6H PRN Lyndle Herrlich, MD        Or  . ondansetron [facility]) injection 4 mg  4 mg Intravenous Q6H PRN Lyndle Herrlich, MD      . tranexamic acid (CYKLOKAPRON) 2,000 mg in sodium chloride 0.9 % 50 mL Topical Application  2,000 mg Topical Once Lyndle Herrlich, MD      . vitamin B-12 (CYANOCOBALAMIN) tablet 1,000 mcg  1,000 mcg Oral Daily Lyndle Herrlich, MD   1,000 mcg at 08/23/19 0848     Discharge Medications: Please see discharge summary for a list of discharge medications.  Relevant Imaging Results:  Relevant Lab Results:   Additional Information SS# [id number]  Barrie Dunker, RN

## 2019-08-23 NOTE — Progress Notes (Signed)
Patient has become more confused during the shift. She has been pulling at her surgical dressings and has discontinued two IV sites and foley catheter. Foley catheter site assessed with no issues noted. IV team on the unit to restart 3rd iv site. Mittens applied to prevent injury. Will continue to monitor.

## 2019-08-23 NOTE — Evaluation (Signed)
Physical Therapy Evaluation Patient Details Name: Jaime [name] MRN: [id number] DOB: 09-Feb-1933 Today's Date: 08/23/2019   History of Present Illness  Pt is an 86 y.o. female presenting to hospital 08/20/18 s/p fall outside and c/o L thigh pain.  Imaging showing acute comminuted L intertrochanteric fx with 1/2 shaft diameter medial displacement and displaced lesser trochanteric fx fragment.  Pt s/p IMN femoral L 1/4 with 2 distal locking bolts.  PMH includes anxiety, depression, htn, ORIF L distal radial fx 08/2015; also per chart pt with h/o dementia.  Clinical Impression  Prior to hospital admission, pt was ambulatory (no AD) and lives with her son (pt and pt's son have 24/7 caregivers for both of their needs).  Pt oriented to person (name and DOB) and month only during session; pt did not remember L LE fx or surgery.  Pt initially reporting living alone but pt gave therapist permission to call and speak with her son Jaime [name] or daughter in Chief Financial Officer; pt's daughter in law gave therapist appropriate password and reports she takes care of pt's primary needs and was able to describe pt's home set-up and PLOF.  [name] reports current caregivers would not be able to handle pt's anticipated new needs and also pt's son's needs.  Currently pt is mod to max assist x1 semi-supine to sitting edge of bed; mod to max assist x1 to stand up to walker; and mod to max assist x1 to perform stand pivot bed to recliner towards R side.  Pt unable to take any steps with max cueing, 1 assist, and walker use.  Of note, pt demonstrating significant difficulty with trying to figure out how to use a walker requiring constant vc's and tactile cues for safety.  Pt would benefit from skilled PT to address noted impairments and functional limitations (see below for any additional details).  Upon hospital discharge, pt would benefit from STR.    Follow Up Recommendations SNF    Equipment Recommendations  Rolling  walker with 5" wheels;3in1 (PT)    Recommendations for Other Services OT consult     Precautions / Restrictions Precautions Precautions: Fall Restrictions Weight Bearing Restrictions: Yes LLE Weight Bearing: Weight bearing as tolerated      Mobility  Bed Mobility Overal bed mobility: Needs Assistance Bed Mobility: Supine to Sit     Supine to sit: Mod assist;Max assist;HOB elevated     General bed mobility comments: assist for L>R LE; assist for trunk; vc's for technique; assist to scoot to edge of bed using bed sheet  Transfers Overall transfer level: Needs assistance Equipment used: Rolling walker (2 wheeled) Transfers: Sit to/from [facility] Sit to Stand: Mod assist;Max assist Stand pivot transfers: Mod assist;Max assist       General transfer comment: assist to initiate and come to full stand up to walker (with vc's and tactile cues for positioning and technique); stand pivot bed to recliner to R with mod to max assist x1  Ambulation/Gait             General Gait Details: pt unable to take steps with max cueing and 1 assist  Stairs            Wheelchair Mobility    Modified Rankin (Stroke Patients Only)       Balance Overall balance assessment: Needs assistance Sitting-balance support: Single extremity supported;Feet supported Sitting balance-Leahy Scale: Fair Sitting balance - Comments: static sitting (pt leaning towards R side d/t L hip pain) Postural control:  Right lateral lean Standing balance support: Bilateral upper extremity supported Standing balance-Leahy Scale: Fair Standing balance comment: pt requiring B UE support for static standing balance (pt tending to lean towards R side to offweight L LE)                             Pertinent Vitals/Pain Pain Assessment: Faces Faces Pain Scale: Hurts a little bit Pain Location: L hip/thigh at rest Pain Descriptors / Indicators: Discomfort Pain Intervention(s):  Limited activity within patient's tolerance;Monitored during session;Premedicated before session;Repositioned  Vitals (HR and O2 on room air) stable and WFL throughout treatment session.    Home Living Family/patient expects to be discharged to:: Private residence Living Arrangements: Children(pt's "handicap" son (per pt's daughter)) Available Help at Discharge: Other (Comment)(caregivers 24/7 for pt and pt's son) Type of Home: House Home Access: Stairs to enter Entrance Stairs-Rails: Right;Left;Can reach both Entrance Stairs-Number of Steps: 3 Home Layout: Multi-level;1/2 bath on main level;Bed/bath upstairs        Prior Function Level of Independence: Needs assistance   Gait / Transfers Assistance Needed: Independent with ambulation (no falls in past 6 months but did have fall in last year d/t syncope)     Comments: Caregivers 24/7 (7am-3pm; 3pm to 11 pm; 11pm to 7am) for pt and pt's son     Hand Dominance        Extremity/Trunk Assessment   Upper Extremity Assessment Upper Extremity Assessment: Generalized weakness    Lower Extremity Assessment Lower Extremity Assessment: Difficult to assess due to impaired cognition(R LE WFL; at least 3/5 L LE DF/PF AROM and 3-/5 hip flexion)    Cervical / Trunk Assessment Cervical / Trunk Assessment: Normal  Communication   Communication: HOH  Cognition Arousal/Alertness: Awake/alert Behavior During Therapy: Impulsive Overall Cognitive Status: No family/caregiver present to determine baseline cognitive functioning                                 General Comments: Oriented to person (name and DOB) and month (not year or day); disoriented to place and situation (pt required frequent reminders of L hip fx and surgery)      General Comments General comments (skin integrity, edema, etc.): mild drainage noted L hip/thigh dressings.  Nursing cleared pt for participation in physical therapy.  Pt agreeable to PT  session.    Exercises Total Joint Exercises Ankle Circles/Pumps: AROM;Strengthening;Both;10 reps;Supine Short Arc Quad: AAROM;Strengthening;Left;10 reps;Supine Heel Slides: AAROM;Strengthening;Left;10 reps;Supine Hip ABduction/ADduction: AAROM;Strengthening;Left;10 reps;Supine   Assessment/Plan    PT Assessment Patient needs continued PT services  PT Problem List Decreased strength;Decreased activity tolerance;Decreased balance;Decreased mobility;Decreased knowledge of use of DME;Decreased safety awareness;Decreased knowledge of precautions;Pain;Decreased skin integrity       PT Treatment Interventions DME instruction;Gait training;Stair training;Functional mobility training;Therapeutic activities;Therapeutic exercise;Balance training;Patient/family education    PT Goals (Current goals can be found in the Care Plan section)  Acute Rehab PT Goals Patient Stated Goal: to be able to walk again PT Goal Formulation: With patient Time For Goal Achievement: 09/06/19 Potential to Achieve Goals: Good    Frequency BID   Barriers to discharge Decreased caregiver support      Co-evaluation               AM-PAC PT "6 Clicks" Mobility  Outcome Measure Help needed turning from your back to your side while in a flat bed without using bedrails?:  A Lot Help needed moving from lying on your back to sitting on the side of a flat bed without using bedrails?: A Lot Help needed moving to and from a bed to a chair (including a wheelchair)?: A Lot Help needed standing up from a chair using your arms (e.g., wheelchair or bedside chair)?: A Lot Help needed to walk in hospital room?: Total Help needed climbing 3-5 steps with a railing? : Total 6 Click Score: 10    End of Session Equipment Utilized During Treatment: Gait belt Activity Tolerance: Patient limited by pain Patient left: in chair;with call bell/phone within reach;with chair alarm set;with SCD's reapplied;Other (comment)(B heels  floating via pillows) Nurse Communication: Mobility status;Precautions;Weight bearing status PT Visit Diagnosis: Other abnormalities of gait and mobility (R26.89);Muscle weakness (generalized) (M62.81);History of falling (Z91.81);Difficulty in walking, not elsewhere classified (R26.2);Pain Pain - Right/Left: Left Pain - part of body: Hip    Time: 3976-7341 PT Time Calculation (min) (ACUTE ONLY): 35 min   Charges:   PT Evaluation $PT Eval Low Complexity: 1 Low PT Treatments $Therapeutic Exercise: 8-22 mins        Nada Godley, PT 08/23/19, 10:20 AM

## 2019-08-23 NOTE — TOC Initial Note (Signed)
Transition of Care [facility]) - Initial/Assessment Note    Patient Details  Name: Jaime [name] MRN: [id number] Date of Birth: 1932/11/06  Transition of Care [facility]) CM/SW Contact:    Barrie Dunker, RN Phone Number: 08/23/2019, 3:23 PM  Clinical Narrative:                 Talked to [name] the daughter in- Law and the patient, Sent a bed search for SNF, completed FL2 and PASSR, once bed offers are obtained will review to get choice  Expected Discharge Plan: Skilled Nursing Facility Barriers to Discharge: English as a second language teacher, Continued Medical Work up, SNF Pending bed offer   Patient Goals and CMS Choice Patient states their goals for this hospitalization and ongoing recovery are:: go to rehab      Expected Discharge Plan and Services Expected Discharge Plan: Skilled Nursing Facility                                              Prior Living Arrangements/Services   Lives with:: Adult Children, Other (Comment)(daughter in [employer] geologist) Patient language and need for interpreter reviewed:: Yes        Need for Family Participation in Patient Care: No (Comment) Care giver support system in place?: Yes (comment)   Criminal Activity/Legal Involvement Pertinent to Current Situation/Hospitalization: No - Comment as needed  Activities of Daily Living Home Assistive Devices/Equipment: Dentures (specify type), Walker (specify type) ADL Screening (condition at time of admission) Patient's cognitive ability adequate to safely complete daily activities?: No Is the patient deaf or have difficulty hearing?: No Does the patient have difficulty seeing, even when wearing glasses/contacts?: No Does the patient have difficulty concentrating, remembering, or making decisions?: No Patient able to express need for assistance with ADLs?: Yes Does the patient have difficulty dressing or bathing?: No Independently performs ADLs?: Yes (appropriate for developmental age) Does the  patient have difficulty walking or climbing stairs?: No Weakness of Legs: None Weakness of Arms/Hands: None  Permission Sought/Granted   Permission granted to share information with : Yes, Verbal Permission Granted              Emotional Assessment   Attitude/Demeanor/Rapport: Other (comment)(confused) Affect (typically observed): Unable to Assess Orientation: : Oriented to Self Alcohol / Substance Use: Not Applicable Psych Involvement: No (comment)  Admission diagnosis:  Intertrochanteric fracture (HCC) [S72.143A] Closed fracture of left hip, initial encounter (HCC) [S72.002A] Fall, initial encounter [W19.XXXA] Patient Active Problem List   Diagnosis Date Noted  . Intertrochanteric fracture (HCC) 08/21/2019  . Hypothyroidism 08/21/2019  . Protein-calorie malnutrition, severe 04/20/2018  . Dementia with behavioral disturbance (HCC) 04/20/2018  . Acute encephalopathy 04/19/2018   PCP:  Mickey Farber, MD Pharmacy:   [facility] [address], [place] - [address] [place] [address] Phone: [phone] Fax: [phone]     Social Determinants of Health (SDOH) Interventions    Readmission Risk Interventions Readmission Risk Prevention Plan 04/21/2018  Post Dischage Appt Complete  Medication Screening Complete  Transportation Screening Complete  PCP follow-up Complete  Some recent data might be hidden

## 2019-08-23 NOTE — Progress Notes (Signed)
Physical Therapy Treatment Patient Details Name: Jaime [name] MRN: [id number] DOB: 1933-08-13 Today's Date: 08/23/2019    History of Present Illness Pt is an 86 y.o. female presenting to hospital 08/20/18 s/p fall outside and c/o L thigh pain.  Imaging showing acute comminuted L intertrochanteric fx with 1/2 shaft diameter medial displacement and displaced lesser trochanteric fx fragment.  Pt s/p IMN femoral L 1/4 with 2 distal locking bolts.  PMH includes anxiety, depression, htn, ORIF L distal radial fx 08/2015; also per chart pt with h/o dementia.    PT Comments    Pt resting in recliner upon PT arrival.  Able to progress to standing up to walker and taking steps with walker recliner to bed with 1 assist.  Extra cueing and time required for activities and also to manage walker with activities.  Pt appearing antalgic with L LE with activity but pt reporting no L hip/thigh pain with rest.  Will continue to focus on strengthening and progressive functional mobility per pt tolerance.    Follow Up Recommendations  SNF     Equipment Recommendations  Rolling walker with 5" wheels;3in1 (PT)    Recommendations for Other Services OT consult     Precautions / Restrictions Precautions Precautions: Fall Restrictions Weight Bearing Restrictions: Yes LLE Weight Bearing: Weight bearing as tolerated    Mobility  Bed Mobility Overal bed mobility: Needs Assistance Bed Mobility: Sit to Supine       Sit to supine: Mod assist;Max assist   General bed mobility comments: assist for L>R LE; assist for trunk; vc's for technique; 2 assist to boost pt up in bed  Transfers Overall transfer level: Needs assistance Equipment used: Rolling walker (2 wheeled) Transfers: Sit to/from Stand Sit to Stand: Min assist;Mod assist         General transfer comment: vc's for UE/LE placement; assist to initiate and come to full stand from chair and control descent sitting onto  bed  Ambulation/Gait Ambulation/Gait assistance: Min assist;Mod assist Gait Distance (Feet): 3 Feet(recliner to bed) Assistive device: Rolling walker (2 wheeled)   Gait velocity: decreased   General Gait Details: max vc's for technique and assist for walker management; antalgic; increased effort and time to perform   Stairs             Wheelchair Mobility    Modified Rankin (Stroke Patients Only)       Balance Overall balance assessment: Needs assistance Sitting-balance support: Single extremity supported;Feet supported Sitting balance-Leahy Scale: Fair Sitting balance - Comments: static sitting (pt leaning towards R side d/t L hip pain) Postural control: Right lateral lean Standing balance support: Bilateral upper extremity supported Standing balance-Leahy Scale: Fair Standing balance comment: pt requiring B UE support for static standing balance (pt tending to lean towards R side to offweight L LE)                            Cognition Arousal/Alertness: Awake/alert Behavior During Therapy: Impulsive Overall Cognitive Status: No family/caregiver present to determine baseline cognitive functioning                                 General Comments: Oriented to person (name and DOB) and month (not year or day); disoriented to place and situation (pt required frequent reminders of L hip fx and surgery)      Exercises      General Comments  General comments (skin integrity, edema, etc.): mild drainage noted L hip/thigh dressings      Pertinent Vitals/Pain Pain Assessment: Faces Faces Pain Scale: Hurts a little bit Pain Location: L hip/thigh at rest Pain Descriptors / Indicators: Discomfort Pain Intervention(s): Limited activity within patient's tolerance;Monitored during session;Repositioned    Home Living                      Prior Function            PT Goals (current goals can now be found in the care plan section)  Acute Rehab PT Goals Patient Stated Goal: to be able to walk again PT Goal Formulation: With patient Time For Goal Achievement: 09/06/19 Potential to Achieve Goals: Good Progress towards PT goals: Progressing toward goals    Frequency    BID      PT Plan Current plan remains appropriate    Co-evaluation              AM-PAC PT "6 Clicks" Mobility   Outcome Measure  Help needed turning from your back to your side while in a flat bed without using bedrails?: A Lot Help needed moving from lying on your back to sitting on the side of a flat bed without using bedrails?: A Lot Help needed moving to and from a bed to a chair (including a wheelchair)?: A Lot Help needed standing up from a chair using your arms (e.g., wheelchair or bedside chair)?: A Lot Help needed to walk in hospital room?: Total Help needed climbing 3-5 steps with a railing? : Total 6 Click Score: 10    End of Session Equipment Utilized During Treatment: Gait belt Activity Tolerance: Patient limited by pain Patient left: in bed;with call bell/phone within reach;with bed alarm set;with SCD's reapplied;Other (comment)(B heels floating via pillow) Nurse Communication: Mobility status;Precautions;Weight bearing status PT Visit Diagnosis: Other abnormalities of gait and mobility (R26.89);Muscle weakness (generalized) (M62.81);History of falling (Z91.81);Difficulty in walking, not elsewhere classified (R26.2);Pain Pain - Right/Left: Left Pain - part of body: Hip     Time: 0932-3557 PT Time Calculation (min) (ACUTE ONLY): 23 min  Charges:  $Therapeutic Activity: 23-37 mins                     Hendricks Limes, PT 08/23/19, 5:36 PM

## 2019-08-23 NOTE — Anesthesia Postprocedure Evaluation (Signed)
Anesthesia Post Note  Patient: Jaime [name]  Procedure(s) Performed: INTRAMEDULLARY (IM) NAIL FEMORAL, LEFT (Left )  Patient location during evaluation: Nursing Unit Anesthesia Type: Spinal Level of consciousness: oriented and awake and alert Pain management: pain level controlled Vital Signs Assessment: post-procedure vital signs reviewed and stable Respiratory status: spontaneous breathing and respiratory function stable Cardiovascular status: blood pressure returned to baseline and stable Postop Assessment: no headache, no backache, no apparent nausea or vomiting and patient able to bend at knees Anesthetic complications: no     Last Vitals:  Vitals:   08/22/19 2157 08/23/19 0400  BP: (!) 132/56 (!) 139/54  Pulse: 78 69  Resp: 16 16  Temp: 37.1 C 36.8 C  SpO2: 97% 99%    Last Pain:  Vitals:   08/23/19 0400  TempSrc: Oral  PainSc:                  Jules Schick

## 2019-08-23 NOTE — Progress Notes (Signed)
PROGRESS NOTE    Jaime [name]  XBJ:[id number] DOB: September 01, 1932 DOA: 08/21/2019 PCP: Jaime Kayser, MD      Brief Narrative:  Jaime [name] is a 86 y.o. F with dementia, HTN, hypothyroidism, depression, who presented with mechanical fall and left hip pain.  In the ER, radiograph showed femoral neck fracture on the left, she was hypertensive, hemodynamically stable otherwise.  Orthopedics were consulted recommended repair of the fracture.        Assessment & Plan:  Hip fracture S/p intramedullary nail, left hip by Dr. Harlow [name], 1/4 -Pain control per protocol -PT eval -Consult orthopedics, appreciate recommendations   Hypertension Blood pressure well controlled -Continue irbesartan  Hypothyroidism -Continue levothyroxine  Depression/dementia -Continue mirtazapine  Hyponatremia Mild, asymptomatic -Trend BMP  Anemia -Trend hemoglobin postop      MDM and disposition: The below labs and imaging reports reviewed and summarized above.  Medication management as above.    Patient was admitted with hip fracture.  She underwent uncomplicated hip repair on 1/4.  She has worked with physical therapy, and will work towards skilled nursing placement in 2 days      Barriers to discharge: SNF authorization       DVT prophylaxis: Lovenox Code Status: Full code Family Communication: Daughter by phone     Consultants:   Orthopedic  Procedures:   1/4 ORIF, left hip by Dr. Harlow [name]  Antimicrobials:       Subjective: No chest pain, dyspnea, nausea, diarrhea, urinary dysfunction.  Little bit confused, somewhat weak.  Objective: Vitals:   08/22/19 2157 08/23/19 0400 08/23/19 0846 08/23/19 1618  BP: (!) 132/56 (!) 139/54 (!) 130/42 (!) 134/46  Pulse: 78 69 76 80  Resp: 16 16 18 18   Temp: 98.7 F (37.1 C) 98.2 F (36.8 C) 98.3 F (36.8 C) 99.1 F (37.3 C)  TempSrc: Oral Oral Oral Oral  SpO2: 97% 99% 100% 95%  Weight:      Height:         Intake/Output Summary (Last 24 hours) at 08/23/2019 1931 Last data filed at 08/23/2019 0600 Gross per 24 hour  Intake 862.69 ml  Output 500 ml  Net 362.69 ml   Filed Weights   08/21/19 1937 08/22/19 1056  Weight: 75.8 kg 75.8 kg    Examination: General appearance: Thin elderly adult female, alert and in no acute distress.  Appears tired. HEENT: Anicteric, conjunctiva pink, lids and lashes normal. No nasal deformity, discharge, epistaxis.  Lips moist, teeth normal. OP tacky dry, no oral lesions.   Skin: Warm and dry.  No suspicious rashes or lesions.  No flushing. Cardiac: RRR, no murmurs appreciated.  No LE edema.    Respiratory: Normal respiratory rate and rhythm.  CTAB without rales or wheezes. Abdomen: Abdomen soft.  No tenderness palpation or guarding.. No ascites, distension, hepatosplenomegaly.   MSK: No deformities or effusions of the large joints of the upper or lower extremities bilaterally. Neuro: Awake and alert. Naming is grossly intact, and the patient's recall, recent and remote, as well as general fund of knowledge seem within normal limits.  Muscle tone diminished, without fasciculations.  Moves all extremities equally with severe generalized weakness and with normal coordination.  Marland Kitchen Speech fluent.    Psych: Sensorium intact and responding to questions, attention diminished. Affect blunted.  Judgment and insight appear moderately impaired.      Data Reviewed: I have personally reviewed following labs and imaging studies:  CBC: Recent Labs  Lab 08/21/19 1941 08/22/19 0413  WBC 6.4  7.6  NEUTROABS 4.1  --   HGB 12.8 10.8*  HCT 38.7 32.5*  MCV 92.8 91.8  PLT 161 226   Basic Metabolic Panel: Recent Labs  Lab 08/21/19 2110 08/22/19 0413  NA 131* 132*  K 4.3 4.4  CL 98 102  CO2 24 23  GLUCOSE 123* 138*  BUN 13 12  CREATININE 0.68 0.52  CALCIUM 8.9 8.3*   GFR: Estimated Creatinine Clearance: 52.8 mL/min (by C-G formula based on SCr of 0.52  mg/dL). Liver Function Tests: Recent Labs  Lab 08/21/19 2110 08/22/19 0413  AST 24 21  ALT 17 15  ALKPHOS 85 72  BILITOT 0.5 0.9  PROT 6.8 5.9*  ALBUMIN 3.7 3.3*   No results for input(s): LIPASE, AMYLASE in the last 168 hours. No results for input(s): AMMONIA in the last 168 hours. Coagulation Profile: No results for input(s): INR, PROTIME in the last 168 hours. Cardiac Enzymes: No results for input(s): CKTOTAL, CKMB, CKMBINDEX, TROPONINI in the last 168 hours. BNP (last 3 results) No results for input(s): PROBNP in the last 8760 hours. HbA1C: No results for input(s): HGBA1C in the last 72 hours. CBG: No results for input(s): GLUCAP in the last 168 hours. Lipid Profile: No results for input(s): CHOL, HDL, LDLCALC, TRIG, CHOLHDL, LDLDIRECT in the last 72 hours. Thyroid Function Tests: No results for input(s): TSH, T4TOTAL, FREET4, T3FREE, THYROIDAB in the last 72 hours. Anemia Panel: No results for input(s): VITAMINB12, FOLATE, FERRITIN, TIBC, IRON, RETICCTPCT in the last 72 hours. Urine analysis:    Component Value Date/Time   COLORURINE YELLOW (A) 04/19/2018 0728   APPEARANCEUR CLEAR (A) 04/19/2018 0728   LABSPEC 1.013 04/19/2018 0728   PHURINE 7.0 04/19/2018 0728   GLUCOSEU NEGATIVE 04/19/2018 0728   HGBUR SMALL (A) 04/19/2018 0728   BILIRUBINUR NEGATIVE 04/19/2018 0728   KETONESUR NEGATIVE 04/19/2018 0728   PROTEINUR NEGATIVE 04/19/2018 0728   NITRITE NEGATIVE 04/19/2018 0728   LEUKOCYTESUR NEGATIVE 04/19/2018 0728   Sepsis Labs: @LABRCNTIP (procalcitonin:4,lacticacidven:4)  ) Recent Results (from the past 240 hour(s))  SARS CORONAVIRUS 2 (TAT 6-24 HRS) Nasopharyngeal Nasopharyngeal Swab     Status: None   Collection Time: 08/21/19  7:41 PM   Specimen: Nasopharyngeal Swab  Result Value Ref Range Status   SARS Coronavirus 2 NEGATIVE NEGATIVE Final    Comment: (NOTE) SARS-CoV-2 target nucleic acids are NOT DETECTED. The SARS-CoV-2 RNA is generally detectable  in upper and lower respiratory specimens during the acute phase of infection. Negative results do not preclude SARS-CoV-2 infection, do not rule out co-infections with other pathogens, and should not be used as the sole basis for treatment or other patient management decisions. Negative results must be combined with clinical observations, patient history, and epidemiological information. The expected result is Negative. Fact Sheet for Patients: 10/19/19 Fact Sheet for Healthcare Providers: HairSlick.no This test is not yet approved or cleared by the quierodirigir.com FDA and  has been authorized for detection and/or diagnosis of SARS-CoV-2 by FDA under an Emergency Use Authorization (EUA). This EUA will remain  in effect (meaning this test can be used) for the duration of the COVID-19 declaration under Section 56 4(b)(1) of the Act, 21 U.S.C. section 360bbb-3(b)(1), unless the authorization is terminated or revoked sooner. Performed at [facility], [address], [place], [place]          Radiology Studies: DG Chest 1 View  Result Date: 08/21/2019 CLINICAL DATA:  Fall with femur fracture EXAM: CHEST  1 VIEW COMPARISON:  04/19/2018 FINDINGS: No focal airspace disease or effusion. Stable cardiomediastinal silhouette with aortic atherosclerosis. No pneumothorax. IMPRESSION: No active disease. Electronically Signed   By: Jasmine Pang M.D.   On: 08/21/2019 20:25   DG HIP OPERATIVE UNILAT W OR W/O PELVIS LEFT  Result Date: 08/22/2019 CLINICAL DATA:  Proximal left femur fracture. EXAM: OPERATIVE LEFT HIP (WITH PELVIS IF PERFORMED) 2 VIEWS TECHNIQUE: Fluoroscopic spot image(s) were submitted for interpretation post-operatively. COMPARISON:  RADIOGRAPHS DATED 08/21/2019 FINDINGS: AP and lateral C-arm images demonstrate the patient has undergone open reduction and internal fixation of comminuted proximal left femur  fracture. Intramedullary nail and lag screw in place. Marked improvement in the alignment and position of the major fracture fragments. The lesser trochanter remains displaced. IMPRESSION: Open reduction and internal fixation of comminuted proximal left femur fracture. Marked improvement in the alignment and position of the major fracture fragments. Electronically Signed   By: Francene Boyers M.D.   On: 08/22/2019 13:58   DG Femur Portable Min 2 Views Left  Result Date: 08/21/2019 CLINICAL DATA:  Fall with left leg pain EXAM: LEFT FEMUR PORTABLE 2 VIEWS COMPARISON:  None. FINDINGS: Acute comminuted left intertrochanteric fracture with about 1/2 shaft diameter medial displacement of the shaft. Displaced lesser trochanteric fracture fragment. Linear fracture lucency extends to the base of the femoral neck and the fracture also involves the subtrochanteric shaft of the femur. IMPRESSION: 1. Acute comminuted left intertrochanteric fracture with about 1/2 shaft diameter medial displacement. 2. Displaced lesser trochanteric fracture fragment. Acute comminuted and displaced left intertrochanteric fracture. Electronically Signed   By: Jasmine Pang M.D.   On: 08/21/2019 20:25        Scheduled Meds: . Chlorhexidine Gluconate Cloth  6 each Topical Daily  . cholecalciferol  2,000 Units Oral Daily  . docusate sodium  100 mg Oral BID  . enoxaparin (LOVENOX) injection  40 mg Subcutaneous Q24H  . irbesartan  300 mg Oral Daily  . levothyroxine  137 mcg Oral QAC breakfast  . mirtazapine  15 mg Oral QHS  . tranexamic acid (CYKLOKAPRON) topical -INTRAOP  2,000 mg Topical Once  . cyanocobalamin  1,000 mcg Oral Daily   Continuous Infusions: . lactated ringers 75 mL/hr at 08/22/19 2216     LOS: 2 days    Time spent: 25 minutes    Alberteen Sam, MD [facility] 08/23/2019, 7:31 PM     Please page though AMION or Epic secure chat:  For [employer], Higher education careers adviser

## 2019-08-24 DIAGNOSIS — S72402A Unspecified fracture of lower end of left femur, initial encounter for closed fracture: Secondary | ICD-10-CM | POA: Diagnosis present

## 2019-08-24 DIAGNOSIS — E43 Unspecified severe protein-calorie malnutrition: Secondary | ICD-10-CM

## 2019-08-24 DIAGNOSIS — S72002D Fracture of unspecified part of neck of left femur, subsequent encounter for closed fracture with routine healing: Secondary | ICD-10-CM

## 2019-08-24 LAB — CBC
HCT: 26.9 % — ABNORMAL LOW (ref 36.0–46.0)
Hemoglobin: 9.1 g/dL — ABNORMAL LOW (ref 12.0–15.0)
MCH: 31.2 pg (ref 26.0–34.0)
MCHC: 33.8 g/dL (ref 30.0–36.0)
MCV: 92.1 fL (ref 80.0–100.0)
Platelets: 185 10*3/uL (ref 150–400)
RBC: 2.92 MIL/uL — ABNORMAL LOW (ref 3.87–5.11)
RDW: 12.7 % (ref 11.5–15.5)
WBC: 8.1 10*3/uL (ref 4.0–10.5)
nRBC: 0 % (ref 0.0–0.2)

## 2019-08-24 LAB — BASIC METABOLIC PANEL
Anion gap: 7 (ref 5–15)
BUN: 15 mg/dL (ref 8–23)
CO2: 24 mmol/L (ref 22–32)
Calcium: 8.3 mg/dL — ABNORMAL LOW (ref 8.9–10.3)
Chloride: 99 mmol/L (ref 98–111)
Creatinine, Ser: 0.67 mg/dL (ref 0.44–1.00)
GFR calc Af Amer: >60 mL/min (ref >60)
GFR calc non Af Amer: >60 mL/min (ref >60)
Glucose, Bld: 134 mg/dL — ABNORMAL HIGH (ref 70–99)
Potassium: 4.2 mmol/L (ref 3.5–5.1)
Sodium: 130 mmol/L — ABNORMAL LOW (ref 135–145)

## 2019-08-24 MED ORDER — HYDROCODONE-ACETAMINOPHEN 5-325 MG PO TABS: 1.0000 | ORAL_TABLET | Freq: Four times a day (QID) | ORAL | 0 refills | Status: DC | PRN

## 2019-08-24 MED ORDER — POLYETHYLENE GLYCOL 3350 17 G PO PACK: 17.0000 g | PACK | Freq: Every day | ORAL | 0 refills | Status: DC

## 2019-08-24 MED ORDER — BISACODYL 10 MG RE SUPP
10.0000 mg | Freq: Every day | RECTAL | Status: DC
  Administered 2019-08-24: 10 mg via RECTAL
  Filled 2019-08-24: qty 1

## 2019-08-24 MED ORDER — ENOXAPARIN SODIUM 40 MG/0.4ML ~~LOC~~ SOLN: 40.0000 mg | SUBCUTANEOUS | 0 refills | Status: DC

## 2019-08-24 NOTE — TOC Progression Note (Signed)
Transition of Care [facility]) - Progression Note    Patient Details  Name: Jaime [name] MRN: [id number] Date of Birth: 10-27-1932  [facility] CM/SW Contact  Barrie Dunker, RN Phone Number: 08/24/2019, 10:19 AM  Clinical Narrative:    Attempted to call the patient's daughter in law [name] to review bed offers, I left a VM for a return call   Expected Discharge Plan: Skilled Nursing Facility Barriers to Discharge: English as a second language teacher, Continued Medical Work up, SNF Pending bed offer  Expected Discharge Plan and Services Expected Discharge Plan: Skilled Nursing Facility                                               Social Determinants of Health (SDOH) Interventions    Readmission Risk Interventions Readmission Risk Prevention Plan 04/21/2018  Post Dischage Appt Complete  Medication Screening Complete  Transportation Screening Complete  PCP follow-up Complete  Some recent data might be hidden

## 2019-08-24 NOTE — Progress Notes (Signed)
  Subjective:  Patient reports pain as mild.    Objective:   VITALS:   Vitals:   08/23/19 1618 08/24/19 0031 08/24/19 0436 08/24/19 0755  BP: (!) 134/46 (!) 137/46  (!) 143/50  Pulse: 80 83  85  Resp: 18 16  17   Temp: 99.1 F (37.3 C) 98.3 F (36.8 C)  98 F (36.7 C)  TempSrc: Oral   Oral  SpO2: 95% 100%  99%  Weight:   70.9 kg   Height:        PHYSICAL EXAM:  Sensation intact distally Dorsiflexion/Plantar flexion intact Incision: scant drainage No cellulitis present Compartment soft  LABS  Results for orders placed or performed during the hospital encounter of 08/21/19 (from the past 24 hour(s))  CBC     Status: Abnormal   Collection Time: 08/24/19  4:07 AM  Result Value Ref Range   WBC 8.1 4.0 - 10.5 K/uL   RBC 2.92 (L) 3.87 - 5.11 MIL/uL   Hemoglobin 9.1 (L) 12.0 - 15.0 g/dL   HCT 10/22/19 (L) 16.1 - 09.6 %   MCV 92.1 80.0 - 100.0 fL   MCH 31.2 26.0 - 34.0 pg   MCHC 33.8 30.0 - 36.0 g/dL   RDW 04.5 40.9 - 81.1 %   Platelets 185 150 - 400 K/uL   nRBC 0.0 0.0 - 0.2 %  Basic metabolic panel     Status: Abnormal   Collection Time: 08/24/19  4:07 AM  Result Value Ref Range   Sodium 130 (L) 135 - 145 mmol/L   Potassium 4.2 3.5 - 5.1 mmol/L   Chloride 99 98 - 111 mmol/L   CO2 24 22 - 32 mmol/L   Glucose, Bld 134 (H) 70 - 99 mg/dL   BUN 15 8 - 23 mg/dL   Creatinine, Ser 10/22/19 0.44 - 1.00 mg/dL   Calcium 8.3 (L) 8.9 - 10.3 mg/dL   GFR calc non Af Amer >60 >60 mL/min   GFR calc Af Amer >60 >60 mL/min   Anion gap 7 5 - 15    DG HIP OPERATIVE UNILAT W OR W/O PELVIS LEFT  Result Date: 08/22/2019 CLINICAL DATA:  Proximal left femur fracture. EXAM: OPERATIVE LEFT HIP (WITH PELVIS IF PERFORMED) 2 VIEWS TECHNIQUE: Fluoroscopic spot image(s) were submitted for interpretation post-operatively. COMPARISON:  RADIOGRAPHS DATED 08/21/2019 FINDINGS: AP and lateral C-arm images demonstrate the patient has undergone open reduction and internal fixation of comminuted proximal left  femur fracture. Intramedullary nail and lag screw in place. Marked improvement in the alignment and position of the major fracture fragments. The lesser trochanter remains displaced. IMPRESSION: Open reduction and internal fixation of comminuted proximal left femur fracture. Marked improvement in the alignment and position of the major fracture fragments. Electronically Signed   By: 10/19/2019 M.D.   On: 08/22/2019 13:58    Assessment/Plan: 2 Days Post-Op   Active Problems:   Protein-calorie malnutrition, severe   Dementia with behavioral disturbance (HCC)   Hypothyroidism   Closed fracture of left hip (HCC)   Up with therapy Discharge to SNF  Change dressing as needed Lovenox for DVT prophylaxis RTC 2 weeks for staple removal   10/20/2019 , MD 08/24/2019, 12:07 PM

## 2019-08-24 NOTE — Progress Notes (Signed)
Pt ready for discharge to SNF per MD. Pt assessment unchanged from this morning. Pt had BM after suppository. Report called to [facility] at [facility]; all questions answered and discharge instructions reviewed. EMS transportation set up for pt by RN.  Shelbie Ammons

## 2019-08-24 NOTE — Care Management Important Message (Signed)
Important Message  Patient Details  Name: Jaime [name] MRN: [id number] Date of Birth: 12/08/1932   Medicare Important Message Given:  N/A - LOS <3 / Initial given by admissions  Initial Important Message given 08/23/19 @ 9:49 am  Verita Schneiders Jarrod Bodkins 08/24/2019, 9:24 AM

## 2019-08-24 NOTE — TOC Progression Note (Signed)
Transition of Care [facility]) - Progression Note    Patient Details  Name: Leasha Goldberger MRN: [id number] Date of Birth: [birth year]  Transition of Care [facility]) CM/SW Contact  Barrie Dunker, RN Phone Number: 08/24/2019, 10:28 AM  Clinical Narrative:    Sherron Monday to [facility] and reviewed the bed choices, she chose [facility], I notified Tresa Endo at [facility]   Expected Discharge Plan: Skilled Nursing Facility Barriers to Discharge: English as a second language teacher, Continued Medical Work up, SNF Pending bed offer  Expected Discharge Plan and Services Expected Discharge Plan: Skilled Nursing Facility                                               Social Determinants of Health (SDOH) Interventions    Readmission Risk Interventions Readmission Risk Prevention Plan 04/21/2018  Post Dischage Appt Complete  Medication Screening Complete  Transportation Screening Complete  PCP follow-up Complete  Some recent data might be hidden

## 2019-08-24 NOTE — Progress Notes (Signed)
PT Cancellation Note  Patient Details Name: Jaime [name] MRN: [id number] DOB: 03-07-1933   Cancelled Treatment:    Reason Eval/Treat Not Completed: Patient declined, no reason specified.  Pt resting in bed upon PT arrival.  Pt reporting pain in L hip/thigh (nurse reports pt received recent pain medication and per chart Tylenol given at [phone]) and also that she did not feel well (pt did not describe any further details when asked).  Attempted to encourage pt multiple times to participate in therapy but pt firmly refusing each time d/t not feeling well and wanting to rest instead.  HR 91 bpm and O2 sats 96% on room air. Therapist notified pt's nurse regarding pt's reports of not feeling well and declining therapy.  Will re-attempt PT treatment session at a later date/time.  Hendricks Limes, PT 08/24/19, 9:08 AM

## 2019-08-24 NOTE — TOC Transition Note (Signed)
Transition of Care [facility]) - CM/SW Discharge Note   Patient Details  Name: Jaime [name] MRN: [id number] Date of Birth: 11-03-1932  Transition of Care [facility]) CM/SW Contact:  Barrie Dunker, RN Phone Number: 08/24/2019, 11:52 AM   Clinical Narrative:     Patient to DC to [facility] today at 1A The bedside nurse to call report to [facility] and to call EMS for transport once ready, the family member Jaime [name] was notified of the DC, Packet placed on the chart  Final next level of care: Skilled Nursing Facility Barriers to Discharge: Barriers Resolved   Patient Goals and CMS Choice Patient states their goals for this hospitalization and ongoing recovery are:: go to rehab      Discharge Placement              Patient chooses bed at: [facility] Patient to be transferred to facility by: EMS Name of family member notified: Jaime [name] Patient and family notified of of transfer: 08/24/19  Discharge Plan and Services                                     Social Determinants of Health (SDOH) Interventions     Readmission Risk Interventions Readmission Risk Prevention Plan 04/21/2018  Post Dischage Appt Complete  Medication Screening Complete  Transportation Screening Complete  PCP follow-up Complete  Some recent data might be hidden

## 2019-08-24 NOTE — Discharge Summary (Signed)
Physician Discharge Summary  Jaime [name] GYI:[id number] DOB: 07-07-1933 DOA: 08/21/2019  PCP: Ezequiel Kayser, MD  Admit date: 08/21/2019 Discharge date: 08/24/2019  Admitted From: Home Disposition: Skilled nursing facility  Recommendations for Outpatient Follow-up:  1. Follow-up with MD at SNF. 2. Follow-up with orthopedic surgeon Dr. Harlow Mares in 2 weeks.    Equipment/Devices: As per therapy at the facility  Discharge Condition: Fair CODE STATUS: Full code Diet recommendation: Regular   Discharge Diagnoses:  Principal problem   Closed fracture of left hip ([place])   Active Problems:   Protein-calorie malnutrition, severe   Dementia with behavioral disturbance (HCC)   Hypothyroidism  Brief narrative/HPI 86 year old female with mild to moderate dementia, hypothyroidism, hypertension and depression presented with mechanical fall with left hip pain and was found to have left femoral neck fracture.  Patient taken to the OR and underwent intramedullary nail of the left hip on 1/4.  Hospital course Left femoral neck fracture, closed with routine healing Taken to the OR on 1/4 and underwent intramedullary nail.  Tolerated well.  PT recommends SNF. Pain control with as needed Percocet.  Added bowel regimen.  DVT prophylaxis with subcu Lovenox for 4 weeks. Follow-up with orthopedic surgeon in 2 weeks.  Patient stable to be discharged to SNF.  COVID-19 was tested on 1/4 and negative.  Active problems Essential hypertension Stable.  Continue irbesartan  Postoperative blood loss anemia Mild.  Stable.  Follow at the facility   Hypothyroidism Continue levothyroxine  Depression with dementia Continue Remeron  Disposition: SNF Family communication: None  Discharge Instructions   Allergies as of 08/24/2019      Reactions   Memantine Other (See Comments)   Worsened confusion   Donepezil Other (See Comments)   Poor appetite, weight loss      Medication List    TAKE  these medications   Cholecalciferol 50 MCG (2000 UT) Caps Take 2,000 Units by mouth daily.   cyanocobalamin 1000 MCG tablet Take 1,000 mcg by mouth daily.   enoxaparin 40 MG/0.4ML injection Commonly known as: LOVENOX Inject 0.4 mLs (40 mg total) into the skin daily. Start taking on: August 25, 2019   HYDROcodone-acetaminophen 5-325 MG tablet Commonly known as: NORCO/VICODIN Take 1 tablet by mouth every 6 (six) hours as needed for moderate pain.   levothyroxine 137 MCG tablet Commonly known as: SYNTHROID Take 137 mcg by mouth daily before breakfast.   mirtazapine 15 MG tablet Commonly known as: REMERON Take 15 mg by mouth at bedtime.   polyethylene glycol 17 g packet Commonly known as: MiraLax Take 17 g by mouth daily.   telmisartan 80 MG tablet Commonly known as: MICARDIS Take 80 mg by mouth daily.       Contact information for follow-up providers    Lovell Sheehan, MD Follow up in 2 week(s).   Specialty: Orthopedic Surgery Contact information: [address] [address] [phone]            Contact information for after-discharge care    [facility] .   Service: Skilled Nursing Contact information: [place] [phone]                 Allergies  Allergen Reactions  . Memantine Other (See Comments)    Worsened confusion  . Donepezil Other (See Comments)    Poor appetite, weight loss        Procedures/Studies: DG Chest 1 View  Result  Date: 08/21/2019 CLINICAL DATA:  Fall with femur fracture EXAM: CHEST  1 VIEW COMPARISON:  04/19/2018 FINDINGS: No focal airspace disease or effusion. Stable cardiomediastinal silhouette with aortic atherosclerosis. No pneumothorax. IMPRESSION: No active disease. Electronically Signed   By: Jasmine Pang M.D.   On: 08/21/2019 20:25   DG HIP OPERATIVE UNILAT W OR W/O PELVIS LEFT  Result Date:  08/22/2019 CLINICAL DATA:  Proximal left femur fracture. EXAM: OPERATIVE LEFT HIP (WITH PELVIS IF PERFORMED) 2 VIEWS TECHNIQUE: Fluoroscopic spot image(s) were submitted for interpretation post-operatively. COMPARISON:  RADIOGRAPHS DATED 08/21/2019 FINDINGS: AP and lateral C-arm images demonstrate the patient has undergone open reduction and internal fixation of comminuted proximal left femur fracture. Intramedullary nail and lag screw in place. Marked improvement in the alignment and position of the major fracture fragments. The lesser trochanter remains displaced. IMPRESSION: Open reduction and internal fixation of comminuted proximal left femur fracture. Marked improvement in the alignment and position of the major fracture fragments. Electronically Signed   By: Francene Boyers M.D.   On: 08/22/2019 13:58   DG Femur Portable Min 2 Views Left  Result Date: 08/21/2019 CLINICAL DATA:  Fall with left leg pain EXAM: LEFT FEMUR PORTABLE 2 VIEWS COMPARISON:  None. FINDINGS: Acute comminuted left intertrochanteric fracture with about 1/2 shaft diameter medial displacement of the shaft. Displaced lesser trochanteric fracture fragment. Linear fracture lucency extends to the base of the femoral neck and the fracture also involves the subtrochanteric shaft of the femur. IMPRESSION: 1. Acute comminuted left intertrochanteric fracture with about 1/2 shaft diameter medial displacement. 2. Displaced lesser trochanteric fracture fragment. Acute comminuted and displaced left intertrochanteric fracture. Electronically Signed   By: Jasmine Pang M.D.   On: 08/21/2019 20:25       Subjective: Reports feeling tired.  Left hip pain much better.  Discharge Exam: Vitals:   08/24/19 0031 08/24/19 0755  BP: (!) 137/46 (!) 143/50  Pulse: 83 85  Resp: 16 17  Temp: 98.3 F (36.8 C) 98 F (36.7 C)  SpO2: 100% 99%   Vitals:   08/23/19 1618 08/24/19 0031 08/24/19 0436 08/24/19 0755  BP: (!) 134/46 (!) 137/46  (!) 143/50   Pulse: 80 83  85  Resp: 18 16  17   Temp: 99.1 F (37.3 C) 98.3 F (36.8 C)  98 F (36.7 C)  TempSrc: Oral   Oral  SpO2: 95% 100%  99%  Weight:   70.9 kg   Height:        General: Elderly female not in distress HEENT: Moist mucosa, supple neck Chest: Clear CVs: Normal S1-S2 GI: Soft, nondistended, nontender Musculoskeletal: Warm, clean dressing over left hip with improved ROM     The results of significant diagnostics from this hospitalization (including imaging, microbiology, ancillary and laboratory) are listed below for reference.     Microbiology: Recent Results (from the past 240 hour(s))  SARS CORONAVIRUS 2 (TAT 6-24 HRS) Nasopharyngeal Nasopharyngeal Swab     Status: None   Collection Time: 08/21/19  7:41 PM   Specimen: Nasopharyngeal Swab  Result Value Ref Range Status   SARS Coronavirus 2 NEGATIVE NEGATIVE Final    Comment: (NOTE) SARS-CoV-2 target nucleic acids are NOT DETECTED. The SARS-CoV-2 RNA is generally detectable in upper and lower respiratory specimens during the acute phase of infection. Negative results do not preclude SARS-CoV-2 infection, do not rule out co-infections with other pathogens, and should not be used as the sole basis for treatment or other patient management decisions. Negative results must be  combined with clinical observations, patient history, and epidemiological information. The expected result is Negative. Fact Sheet for Patients: HairSlick.no Fact Sheet for Healthcare Providers: quierodirigir.com This test is not yet approved or cleared by the [place] FDA and  has been authorized for detection and/or diagnosis of SARS-CoV-2 by FDA under an Emergency Use Authorization (EUA). This EUA will remain  in effect (meaning this test can be used) for the duration of the COVID-19 declaration under Section 56 4(b)(1) of the Act, 21 U.S.C. section 360bbb-3(b)(1), unless the  authorization is terminated or revoked sooner. Performed at [facility], [address] [place], [place] [address]      Labs: BNP (last 3 results) No results for input(s): BNP in the last 8760 hours. Basic Metabolic Panel: Recent Labs  Lab 08/21/19 2110 08/22/19 0413 08/24/19 0407  NA 131* 132* 130*  K 4.3 4.4 4.2  CL 98 102 99  CO2 24 23 24   GLUCOSE 123* 138* 134*  BUN 13 12 15   CREATININE 0.68 0.52 0.67  CALCIUM 8.9 8.3* 8.3*   Liver Function Tests: Recent Labs  Lab 08/21/19 2110 08/22/19 0413  AST 24 21  ALT 17 15  ALKPHOS 85 72  BILITOT 0.5 0.9  PROT 6.8 5.9*  ALBUMIN 3.7 3.3*   No results for input(s): LIPASE, AMYLASE in the last 168 hours. No results for input(s): AMMONIA in the last 168 hours. CBC: Recent Labs  Lab 08/21/19 1941 08/22/19 0413 08/24/19 0407  WBC 6.4 7.6 8.1  NEUTROABS 4.1  --   --   HGB 12.8 10.8* 9.1*  HCT 38.7 32.5* 26.9*  MCV 92.8 91.8 92.1  PLT 161 226 185   Cardiac Enzymes: No results for input(s): CKTOTAL, CKMB, CKMBINDEX, TROPONINI in the last 168 hours. BNP: Invalid input(s): POCBNP CBG: No results for input(s): GLUCAP in the last 168 hours. D-Dimer No results for input(s): DDIMER in the last 72 hours. Hgb A1c No results for input(s): HGBA1C in the last 72 hours. Lipid Profile No results for input(s): CHOL, HDL, LDLCALC, TRIG, CHOLHDL, LDLDIRECT in the last 72 hours. Thyroid function studies No results for input(s): TSH, T4TOTAL, T3FREE, THYROIDAB in the last 72 hours.  Invalid input(s): FREET3 Anemia work up No results for input(s): VITAMINB12, FOLATE, FERRITIN, TIBC, IRON, RETICCTPCT in the last 72 hours. Urinalysis    Component Value Date/Time   COLORURINE YELLOW (A) 04/19/2018 0728   APPEARANCEUR CLEAR (A) 04/19/2018 0728   LABSPEC 1.013 04/19/2018 0728   PHURINE 7.0 04/19/2018 0728   GLUCOSEU NEGATIVE 04/19/2018 0728   HGBUR SMALL (A) 04/19/2018 0728   BILIRUBINUR NEGATIVE 04/19/2018 0728    KETONESUR NEGATIVE 04/19/2018 0728   PROTEINUR NEGATIVE 04/19/2018 0728   NITRITE NEGATIVE 04/19/2018 0728   LEUKOCYTESUR NEGATIVE 04/19/2018 0728   Sepsis Labs Invalid input(s): PROCALCITONIN,  WBC,  LACTICIDVEN Microbiology Recent Results (from the past 240 hour(s))  SARS CORONAVIRUS 2 (TAT 6-24 HRS) Nasopharyngeal Nasopharyngeal Swab     Status: None   Collection Time: 08/21/19  7:41 PM   Specimen: Nasopharyngeal Swab  Result Value Ref Range Status   SARS Coronavirus 2 NEGATIVE NEGATIVE Final    Comment: (NOTE) SARS-CoV-2 target nucleic acids are NOT DETECTED. The SARS-CoV-2 RNA is generally detectable in upper and lower respiratory specimens during the acute phase of infection. Negative results do not preclude SARS-CoV-2 infection, do not rule out co-infections with other pathogens, and should not be used as the sole basis for treatment or other patient management decisions. Negative results  must be combined with clinical observations, patient history, and epidemiological information. The expected result is Negative. Fact Sheet for Patients: HairSlick.no Fact Sheet for Healthcare Providers: quierodirigir.com This test is not yet approved or cleared by the [place] FDA and  has been authorized for detection and/or diagnosis of SARS-CoV-2 by FDA under an Emergency Use Authorization (EUA). This EUA will remain  in effect (meaning this test can be used) for the duration of the COVID-19 declaration under Section 56 4(b)(1) of the Act, 21 U.S.C. section 360bbb-3(b)(1), unless the authorization is terminated or revoked sooner. Performed at [facility], [address], [place], [place] [address]      Time coordinating discharge: 35  SIGNED:   Eddie North, MD  [facility] 08/24/2019, 11:35 AM Pager   If 7PM-7AM, please contact night-coverage [email or web address] Password [email or web address]

## 2019-08-25 ENCOUNTER — Emergency Department
Admission: EM | Admit: 2019-08-25 | Discharge: 2019-08-26 | Disposition: A | Discharge status: Other Acute Inpatient Hospital | Payer: Medicare Other | Attending: Emergency Medicine | Admitting: Emergency Medicine

## 2019-08-25 ENCOUNTER — Other Ambulatory Visit: Payer: Self-pay

## 2019-08-25 ENCOUNTER — Emergency Department: Payer: Medicare Other

## 2019-08-25 DIAGNOSIS — S72442A Displaced fracture of lower epiphysis (separation) of left femur, initial encounter for closed fracture: Secondary | ICD-10-CM | POA: Diagnosis not present

## 2019-08-25 DIAGNOSIS — S79922A Unspecified injury of left thigh, initial encounter: Secondary | ICD-10-CM | POA: Diagnosis present

## 2019-08-25 DIAGNOSIS — Y939 Activity, unspecified: Secondary | ICD-10-CM | POA: Insufficient documentation

## 2019-08-25 DIAGNOSIS — E039 Hypothyroidism, unspecified: Secondary | ICD-10-CM | POA: Diagnosis not present

## 2019-08-25 DIAGNOSIS — Z79899 Other long term (current) drug therapy: Secondary | ICD-10-CM | POA: Insufficient documentation

## 2019-08-25 DIAGNOSIS — W010XXA Fall on same level from slipping, tripping and stumbling without subsequent striking against object, initial encounter: Secondary | ICD-10-CM | POA: Insufficient documentation

## 2019-08-25 DIAGNOSIS — I10 Essential (primary) hypertension: Secondary | ICD-10-CM | POA: Insufficient documentation

## 2019-08-25 DIAGNOSIS — Y92129 Unspecified place in nursing home as the place of occurrence of the external cause: Secondary | ICD-10-CM | POA: Diagnosis not present

## 2019-08-25 DIAGNOSIS — Y999 Unspecified external cause status: Secondary | ICD-10-CM | POA: Insufficient documentation

## 2019-08-25 DIAGNOSIS — F0390 Unspecified dementia without behavioral disturbance: Secondary | ICD-10-CM | POA: Insufficient documentation

## 2019-08-25 DIAGNOSIS — Z20822 Contact with and (suspected) exposure to covid-19: Secondary | ICD-10-CM | POA: Diagnosis not present

## 2019-08-25 LAB — CBC
HCT: 29.3 % — ABNORMAL LOW (ref 36.0–46.0)
Hemoglobin: 9.7 g/dL — ABNORMAL LOW (ref 12.0–15.0)
MCH: 30.8 pg (ref 26.0–34.0)
MCHC: 33.1 g/dL (ref 30.0–36.0)
MCV: 93 fL (ref 80.0–100.0)
Platelets: 297 10*3/uL (ref 150–400)
RBC: 3.15 MIL/uL — ABNORMAL LOW (ref 3.87–5.11)
RDW: 12.7 % (ref 11.5–15.5)
WBC: 8.7 10*3/uL (ref 4.0–10.5)
nRBC: 0 % (ref 0.0–0.2)

## 2019-08-25 LAB — BASIC METABOLIC PANEL
Anion gap: 12 (ref 5–15)
BUN: 11 mg/dL (ref 8–23)
CO2: 22 mmol/L (ref 22–32)
Calcium: 8.7 mg/dL — ABNORMAL LOW (ref 8.9–10.3)
Chloride: 98 mmol/L (ref 98–111)
Creatinine, Ser: 0.62 mg/dL (ref 0.44–1.00)
GFR calc Af Amer: >60 mL/min (ref >60)
GFR calc non Af Amer: >60 mL/min (ref >60)
Glucose, Bld: 125 mg/dL — ABNORMAL HIGH (ref 70–99)
Potassium: 4.2 mmol/L (ref 3.5–5.1)
Sodium: 132 mmol/L — ABNORMAL LOW (ref 135–145)

## 2019-08-25 LAB — RESPIRATORY PANEL BY RT PCR (FLU A&B, COVID)
Influenza A by PCR: NEGATIVE
Influenza B by PCR: NEGATIVE
SARS Coronavirus 2 by RT PCR: NEGATIVE

## 2019-08-25 MED ORDER — HYDROMORPHONE HCL 1 MG/ML IJ SOLN
1.0000 mg | Freq: Once | INTRAMUSCULAR | Status: AC
  Administered 2019-08-25: 1 mg via INTRAVENOUS
  Filled 2019-08-25: qty 1

## 2019-08-25 MED ORDER — MORPHINE SULFATE (PF) 4 MG/ML IV SOLN
INTRAVENOUS | Status: AC
  Administered 2019-08-25: 19:00:00 4 mg via INTRAVENOUS
  Filled 2019-08-25: qty 1

## 2019-08-25 MED ORDER — FENTANYL CITRATE (PF) 100 MCG/2ML IJ SOLN
100.0000 ug | Freq: Once | INTRAMUSCULAR | Status: AC
  Administered 2019-08-25: 100 ug via INTRAVENOUS
  Filled 2019-08-25: qty 2

## 2019-08-25 MED ORDER — MORPHINE SULFATE (PF) 4 MG/ML IV SOLN: 4.0000 mg | Freq: Once | INTRAVENOUS | Status: AC

## 2019-08-25 NOTE — ED Notes (Signed)
Patient transported to X-ray 

## 2019-08-25 NOTE — Plan of Care (Signed)
86 yo female with dementia, depression, hypertension, hypothyroidism, w hx of L femoral neck nail 08/22/2019 and L ORIF 09/11/2015 apparently had fall w perihardware fracture of the distal left femoral metadiaphysis.  and Dr. Odis Luster (orthopedic) recommended transfer to [facility],  Dr. Jena Gauss accepted and recommended medicine admission.    Please contact [place] upon arrival.  Pt accepted to tele bed.  No covid-19 symptoms per ED.

## 2019-08-25 NOTE — ED Triage Notes (Addendum)
Pt here via EMS from [facility] today due to unwitnessed fall, pt had recent surgery on left hip and was in the hospital 08/21/19 for a fall. Patient states that she did fall on left hip and it is swollen. Dressings are present. Left leg does appear to be shorter than the right.  Ems VSS- 178/64, hr 96, 96%RA   Pt has history of dementia.

## 2019-08-25 NOTE — ED Notes (Signed)
Per Solicitor, no updated info on bed assignment at this time. Pts family member remains with pt at this time

## 2019-08-25 NOTE — ED Notes (Signed)
waiting on bed assignment at [facility], ortho/hospitalism admission

## 2019-08-25 NOTE — ED Provider Notes (Addendum)
[facility] Emergency Department Provider Note  Time seen: 5:12 PM  I have reviewed the triage vital signs and the nursing notes.   HISTORY  Chief Complaint Fall   HPI Jaime [name] is a 86 y.o. female with a past medical history anxiety, dementia, hypertension, recent left hip surgery 08/21/2019 presents to the emergency department for a fall and left leg pain.  According to the patient she was at her nursing facility when she had a fall landing on her left side.  Patient states immediate pain to her left leg and left knee.  Denies any pain or increased pain at least to the left hip.  Denies any other illnesses fever cough congestion or shortness of breath.  Does not believe she hit her head.  Denies LOC.   Past Medical History:  Diagnosis Date  . Anxiety   . Dementia (HCC)    per daughter  . Depression   . Hypertension   . Hypothyroidism     Patient Active Problem List   Diagnosis Date Noted  . Closed fracture of left hip (HCC) 08/24/2019  . Intertrochanteric fracture (HCC) 08/21/2019  . Hypothyroidism 08/21/2019  . Protein-calorie malnutrition, severe 04/20/2018  . Dementia with behavioral disturbance (HCC) 04/20/2018  . Acute encephalopathy 04/19/2018    Past Surgical History:  Procedure Laterality Date  . ABDOMINAL HYSTERECTOMY    . appendectomy    . FEMUR IM NAIL Left 08/22/2019   Procedure: INTRAMEDULLARY (IM) NAIL FEMORAL, LEFT;  Surgeon: Lyndle Herrlich, MD;  Location: [facility] ORS;  Service: Orthopedics;  Laterality: Left;  . MINOR HEMORRHOIDECTOMY    . OPEN REDUCTION INTERNAL FIXATION (ORIF) DISTAL RADIAL FRACTURE Left 09/11/2015   Procedure: OPEN REDUCTION INTERNAL FIXATION (ORIF) DISTAL RADIAL FRACTURE;  Surgeon: Kennedy Bucker, MD;  Location: [facility] ORS;  Service: Orthopedics;  Laterality: Left;  . TONSILLECTOMY    . TUBAL LIGATION      Prior to Admission medications   Medication Sig Start Date End Date Taking? Authorizing Provider   Cholecalciferol 50 MCG (2000 UT) CAPS Take 2,000 Units by mouth daily.    [provider]  cyanocobalamin 1000 MCG tablet Take 1,000 mcg by mouth daily.     [provider]  enoxaparin (LOVENOX) 40 MG/0.4ML injection Inject 0.4 mLs (40 mg total) into the skin daily. 08/25/19 09/24/19  Dhungel, Theda Belfast, MD  HYDROcodone-acetaminophen (NORCO/VICODIN) 5-325 MG tablet Take 1 tablet by mouth every 6 (six) hours as needed for moderate pain. 08/24/19   Dhungel, Nishant, MD  levothyroxine (SYNTHROID, LEVOTHROID) 137 MCG tablet Take 137 mcg by mouth daily before breakfast.    [provider]  mirtazapine (REMERON) 15 MG tablet Take 15 mg by mouth at bedtime. 06/16/19   [provider]  polyethylene glycol (MIRALAX) 17 g packet Take 17 g by mouth daily. 08/24/19   Dhungel, Nishant, MD  telmisartan (MICARDIS) 80 MG tablet Take 80 mg by mouth daily.    [provider]    Allergies  Allergen Reactions  . Memantine Other (See Comments)    Worsened confusion  . Donepezil Other (See Comments)    Poor appetite, weight loss    Family History  Problem Relation Age of Onset  . Breast cancer Maternal Aunt        2 mat aunts    Social History Social History   Tobacco Use  . Smoking status: Never Smoker  . Smokeless tobacco: Never Used  Substance Use Topics  . Alcohol use: Not Currently  .  Drug use: Never    Review of Systems Constitutional: Negative for fever. Cardiovascular: Negative for chest pain. Respiratory: Negative for shortness of breath. Gastrointestinal: Negative for abdominal pain Musculoskeletal: Negative for musculoskeletal complaints Neurological: Negative for headache All other ROS negative  ____________________________________________   PHYSICAL EXAM:  VITAL SIGNS: ED Triage Vitals  Enc Vitals Group     BP 08/25/19 1630 (!) 162/62     Pulse Rate 08/25/19 1645 90     Resp 08/25/19 1645 (!) 23     Temp 08/25/19 1631 98.5 F (36.9  C)     Temp Source 08/25/19 1631 Oral     SpO2 08/25/19 1645 96 %     Weight 08/25/19 1628 156 lb 4.9 oz (70.9 kg)     Height 08/25/19 1628 5\' 8"  (1.727 m)     Head Circumference --      Peak Flow --      Pain Score --      Pain Loc --      Pain Edu? --      Excl. in [place]? --    Constitutional: Alert and oriented. Well appearing and in no distress. Eyes: Normal exam ENT      Head: Normocephalic and atraumatic.      Mouth/Throat: Mucous membranes are moist. Cardiovascular: Normal rate, regular rhythm.  Respiratory: Normal respiratory effort without tachypnea nor retractions. Breath sounds are clear  Gastrointestinal: Soft and nontender. No distention. Musculoskeletal: Nontender with normal range of motion in all extremities.  Neurologic:  Normal speech and language. No gross focal neurologic deficits Skin:  Skin is warm, dry and intact.  Psychiatric: Mood and affect are normal.   ____________________________________________    EKG  EKG viewed and interpreted by myself shows a normal sinus rhythm 87 bpm with a narrow QRS, normal axis, normal intervals, no concerning ST changes.  ____________________________________________    RADIOLOGY  Chest x-ray negative.  Knee x-ray and femur x-ray show displaced obliquely oriented fracture through the distal femur, perihardware fracture.  ____________________________________________   INITIAL IMPRESSION / ASSESSMENT AND PLAN / ED COURSE  Pertinent labs & imaging results that were available during my care of the patient were reviewed by me and considered in my medical decision making (see chart for details).   Patient presents to the emergency department after a fall, recent left hip surgery performed by Dr. Harlow Mares.  Patient appears to have deformity and shortened left lower extremity externally rotated.  Patient's left hip although mildly tender, overall appears well with a well-appearing incision.  Patient appears to have more  discomfort in the distal femur and knee area.  We will obtain x-ray imaging of the femur and knee which should give [place] a decent view of the hip as well.  We will check labs and continue to closely monitor.  We will dose fentanyl for pain control while awaiting x-ray imaging.  X-ray show distal femur perihardware fracture.  Lab work is largely nonrevealing, chest x-ray is clear.  Discussed patient with Dr. Christia Reading of orthopedics we will admit to the hospitalist team.  Discussed with hospitalist with accepted to their service we will perform a rapid Covid test given surgical requirement.   After further review of the films by orthopedics, they believe this is likely outside of their scope and would like the patient transferred to [facility].  They spoke to their trauma orthopedist Dr. Doreatha Martin, who would help care for the patient but would like the patient transferred to the hospital service.  I spoke to Dr. Selena Batten of the hospitalist service who is accepted the patient to his service.   Jaime [name] was evaluated in Emergency Department on 08/25/2019 for the symptoms described in the history of present illness. She was evaluated in the context of the global COVID-19 pandemic, which necessitated consideration that the patient might be at risk for infection with the SARS-CoV-2 virus that causes COVID-19. Institutional protocols and algorithms that pertain to the evaluation of patients at risk for COVID-19 are in a state of rapid change based on information released by regulatory bodies including the CDC and federal and state organizations. These policies and algorithms were followed during the patient's care in the ED.  ____________________________________________   FINAL CLINICAL IMPRESSION(S) / ED DIAGNOSES  Femur fracture   Minna Antis, MD 08/25/19 1831    Minna Antis, MD 08/25/19 3870

## 2019-08-25 NOTE — ED Notes (Signed)
waiting on bed assignment at [facility], ortho/hospitalism admission 

## 2019-08-26 ENCOUNTER — Inpatient Hospital Stay (HOSPITAL_COMMUNITY)
Admission: RE | Admit: 2019-08-26 | Discharge: 2019-09-02 | DRG: 481 | Disposition: A | Discharge status: Skilled Nursing Facility | Payer: Medicare Other | Attending: Family Medicine | Admitting: Family Medicine

## 2019-08-26 ENCOUNTER — Inpatient Hospital Stay (HOSPITAL_COMMUNITY): Payer: Medicare Other | Admitting: Anesthesiology

## 2019-08-26 ENCOUNTER — Encounter (HOSPITAL_COMMUNITY)
Admission: RE | Disposition: A | Discharge status: Skilled Nursing Facility | Payer: Self-pay | Source: Home / Self Care | Attending: Internal Medicine

## 2019-08-26 ENCOUNTER — Other Ambulatory Visit: Payer: Self-pay

## 2019-08-26 ENCOUNTER — Encounter (HOSPITAL_COMMUNITY): Payer: Self-pay | Admitting: Student

## 2019-08-26 ENCOUNTER — Inpatient Hospital Stay (HOSPITAL_COMMUNITY): Payer: Medicare Other

## 2019-08-26 DIAGNOSIS — Z79890 Hormone replacement therapy (postmenopausal): Secondary | ICD-10-CM

## 2019-08-26 DIAGNOSIS — F0391 Unspecified dementia with behavioral disturbance: Secondary | ICD-10-CM | POA: Diagnosis present

## 2019-08-26 DIAGNOSIS — S72442A Displaced fracture of lower epiphysis (separation) of left femur, initial encounter for closed fracture: Secondary | ICD-10-CM | POA: Diagnosis not present

## 2019-08-26 DIAGNOSIS — F0281 Dementia in other diseases classified elsewhere with behavioral disturbance: Secondary | ICD-10-CM | POA: Diagnosis not present

## 2019-08-26 DIAGNOSIS — Y92009 Unspecified place in unspecified non-institutional (private) residence as the place of occurrence of the external cause: Secondary | ICD-10-CM | POA: Diagnosis not present

## 2019-08-26 DIAGNOSIS — S72402A Unspecified fracture of lower end of left femur, initial encounter for closed fracture: Secondary | ICD-10-CM | POA: Diagnosis not present

## 2019-08-26 DIAGNOSIS — E559 Vitamin D deficiency, unspecified: Secondary | ICD-10-CM | POA: Diagnosis present

## 2019-08-26 DIAGNOSIS — Z20822 Contact with and (suspected) exposure to covid-19: Secondary | ICD-10-CM | POA: Diagnosis present

## 2019-08-26 DIAGNOSIS — M9712XA Periprosthetic fracture around internal prosthetic left knee joint, initial encounter: Principal | ICD-10-CM | POA: Diagnosis present

## 2019-08-26 DIAGNOSIS — I10 Essential (primary) hypertension: Secondary | ICD-10-CM | POA: Diagnosis present

## 2019-08-26 DIAGNOSIS — W19XXXA Unspecified fall, initial encounter: Secondary | ICD-10-CM | POA: Diagnosis not present

## 2019-08-26 DIAGNOSIS — Z79899 Other long term (current) drug therapy: Secondary | ICD-10-CM | POA: Diagnosis not present

## 2019-08-26 DIAGNOSIS — G301 Alzheimer's disease with late onset: Secondary | ICD-10-CM

## 2019-08-26 DIAGNOSIS — W1830XA Fall on same level, unspecified, initial encounter: Secondary | ICD-10-CM | POA: Diagnosis present

## 2019-08-26 DIAGNOSIS — E039 Hypothyroidism, unspecified: Secondary | ICD-10-CM

## 2019-08-26 DIAGNOSIS — F03918 Unspecified dementia, unspecified severity, with other behavioral disturbance: Secondary | ICD-10-CM | POA: Diagnosis present

## 2019-08-26 DIAGNOSIS — Z888 Allergy status to other drugs, medicaments and biological substances status: Secondary | ICD-10-CM

## 2019-08-26 DIAGNOSIS — F419 Anxiety disorder, unspecified: Secondary | ICD-10-CM | POA: Diagnosis present

## 2019-08-26 DIAGNOSIS — T148XXA Other injury of unspecified body region, initial encounter: Secondary | ICD-10-CM

## 2019-08-26 DIAGNOSIS — T1490XA Injury, unspecified, initial encounter: Secondary | ICD-10-CM

## 2019-08-26 DIAGNOSIS — E871 Hypo-osmolality and hyponatremia: Secondary | ICD-10-CM | POA: Diagnosis present

## 2019-08-26 DIAGNOSIS — F329 Major depressive disorder, single episode, unspecified: Secondary | ICD-10-CM | POA: Diagnosis present

## 2019-08-26 DIAGNOSIS — D62 Acute posthemorrhagic anemia: Secondary | ICD-10-CM | POA: Diagnosis not present

## 2019-08-26 DIAGNOSIS — Z90710 Acquired absence of both cervix and uterus: Secondary | ICD-10-CM | POA: Diagnosis not present

## 2019-08-26 DIAGNOSIS — D649 Anemia, unspecified: Secondary | ICD-10-CM | POA: Diagnosis not present

## 2019-08-26 HISTORY — PX: ORIF FEMUR FRACTURE: SHX2119

## 2019-08-26 LAB — CREATININE, SERUM
Creatinine, Ser: 0.8 mg/dL (ref 0.44–1.00)
GFR calc Af Amer: >60 mL/min (ref >60)
GFR calc non Af Amer: >60 mL/min (ref >60)

## 2019-08-26 LAB — CBC
HCT: 28.3 % — ABNORMAL LOW (ref 36.0–46.0)
Hemoglobin: 9.5 g/dL — ABNORMAL LOW (ref 12.0–15.0)
MCH: 31.1 pg (ref 26.0–34.0)
MCHC: 33.6 g/dL (ref 30.0–36.0)
MCV: 92.8 fL (ref 80.0–100.0)
Platelets: 265 10*3/uL (ref 150–400)
RBC: 3.05 MIL/uL — ABNORMAL LOW (ref 3.87–5.11)
RDW: 12.7 % (ref 11.5–15.5)
WBC: 9.4 10*3/uL (ref 4.0–10.5)
nRBC: 0 % (ref 0.0–0.2)

## 2019-08-26 LAB — SURGICAL PCR SCREEN
MRSA, PCR: NEGATIVE
Staphylococcus aureus: POSITIVE — AB

## 2019-08-26 LAB — ABO/RH: ABO/RH(D): O POS

## 2019-08-26 LAB — TYPE AND SCREEN
ABO/RH(D): O POS
Antibody Screen: NEGATIVE

## 2019-08-26 LAB — TSH: TSH: 2.867 u[IU]/mL (ref 0.350–4.500)

## 2019-08-26 SURGERY — OPEN REDUCTION INTERNAL FIXATION (ORIF) DISTAL FEMUR FRACTURE
Anesthesia: Spinal | Site: Leg Upper | Laterality: Left

## 2019-08-26 MED ORDER — ALBUMIN HUMAN 5 % IV SOLN: INTRAVENOUS | Status: DC | PRN

## 2019-08-26 MED ORDER — LEVOTHYROXINE SODIUM 25 MCG PO TABS
137.0000 ug | ORAL_TABLET | Freq: Every day | ORAL | Status: DC
  Administered 2019-08-27 – 2019-09-02 (×7): 137 ug via ORAL
  Filled 2019-08-26 (×7): qty 1

## 2019-08-26 MED ORDER — ACETAMINOPHEN 325 MG PO TABS
325.0000 mg | ORAL_TABLET | Freq: Four times a day (QID) | ORAL | Status: DC | PRN
  Administered 2019-08-30 – 2019-09-02 (×7): 650 mg via ORAL
  Filled 2019-08-26 (×8): qty 2

## 2019-08-26 MED ORDER — SENNA 8.6 MG PO TABS
1.0000 | ORAL_TABLET | Freq: Two times a day (BID) | ORAL | Status: DC
  Administered 2019-08-26 – 2019-09-02 (×14): 8.6 mg via ORAL
  Filled 2019-08-26 (×14): qty 1

## 2019-08-26 MED ORDER — METHOCARBAMOL 500 MG PO TABS
500.0000 mg | ORAL_TABLET | Freq: Four times a day (QID) | ORAL | Status: DC | PRN
  Administered 2019-08-30 – 2019-09-01 (×3): 500 mg via ORAL
  Filled 2019-08-26 (×3): qty 1

## 2019-08-26 MED ORDER — METOCLOPRAMIDE HCL 5 MG PO TABS: 5.0000 mg | ORAL_TABLET | Freq: Three times a day (TID) | ORAL | Status: DC | PRN

## 2019-08-26 MED ORDER — MUPIROCIN 2 % EX OINT
1.0000 "application " | TOPICAL_OINTMENT | Freq: Two times a day (BID) | CUTANEOUS | Status: DC
  Administered 2019-08-26 – 2019-08-31 (×9): 1 via NASAL
  Filled 2019-08-26: qty 22

## 2019-08-26 MED ORDER — HYDROCODONE-ACETAMINOPHEN 5-325 MG PO TABS
1.0000 | ORAL_TABLET | Freq: Four times a day (QID) | ORAL | Status: DC | PRN
  Administered 2019-08-28: 18:00:00 1 via ORAL
  Filled 2019-08-26: qty 1

## 2019-08-26 MED ORDER — 0.9 % SODIUM CHLORIDE (POUR BTL) OPTIME
TOPICAL | Status: DC | PRN
  Administered 2019-08-26: 1000 mL

## 2019-08-26 MED ORDER — MIRTAZAPINE 15 MG PO TABS
15.0000 mg | ORAL_TABLET | Freq: Every day | ORAL | Status: DC
  Administered 2019-08-26 – 2019-09-01 (×7): 15 mg via ORAL
  Filled 2019-08-26 (×7): qty 1

## 2019-08-26 MED ORDER — CEFAZOLIN SODIUM-DEXTROSE 2-4 GM/100ML-% IV SOLN
2.0000 g | INTRAVENOUS | Status: AC
  Filled 2019-08-26: qty 100

## 2019-08-26 MED ORDER — IRBESARTAN 75 MG PO TABS: 75.0000 mg | ORAL_TABLET | Freq: Every day | ORAL | Status: DC

## 2019-08-26 MED ORDER — POVIDONE-IODINE 10 % EX SWAB: 2.0000 "application " | Freq: Once | CUTANEOUS | Status: DC

## 2019-08-26 MED ORDER — METOCLOPRAMIDE HCL 5 MG/ML IJ SOLN: 5.0000 mg | Freq: Three times a day (TID) | INTRAMUSCULAR | Status: DC | PRN

## 2019-08-26 MED ORDER — LACTATED RINGERS IV SOLN: INTRAVENOUS | Status: DC

## 2019-08-26 MED ORDER — BUPIVACAINE HCL 0.5 % IJ SOLN
INTRAMUSCULAR | Status: DC | PRN
  Administered 2019-08-26: 12 mg

## 2019-08-26 MED ORDER — PROPOFOL 500 MG/50ML IV EMUL
INTRAVENOUS | Status: DC | PRN
  Administered 2019-08-26: 50 ug/kg/min via INTRAVENOUS

## 2019-08-26 MED ORDER — POTASSIUM CHLORIDE IN NACL 20-0.9 MEQ/L-% IV SOLN
INTRAVENOUS | Status: DC
  Filled 2019-08-26 (×2): qty 1000

## 2019-08-26 MED ORDER — CHLORHEXIDINE GLUCONATE 4 % EX LIQD
60.0000 mL | Freq: Once | CUTANEOUS | Status: AC
  Administered 2019-08-26: 4 via TOPICAL
  Filled 2019-08-26: qty 60

## 2019-08-26 MED ORDER — PROPOFOL 10 MG/ML IV BOLUS
INTRAVENOUS | Status: AC
  Filled 2019-08-26: qty 20

## 2019-08-26 MED ORDER — VANCOMYCIN HCL 1000 MG IV SOLR
INTRAVENOUS | Status: AC
  Filled 2019-08-26: qty 1000

## 2019-08-26 MED ORDER — CHLORHEXIDINE GLUCONATE CLOTH 2 % EX PADS
6.0000 | MEDICATED_PAD | Freq: Every day | CUTANEOUS | Status: AC
  Administered 2019-08-26 – 2019-08-30 (×5): 6 via TOPICAL

## 2019-08-26 MED ORDER — ONDANSETRON HCL 4 MG PO TABS: 4.0000 mg | ORAL_TABLET | Freq: Four times a day (QID) | ORAL | Status: DC | PRN

## 2019-08-26 MED ORDER — CEFAZOLIN SODIUM-DEXTROSE 2-4 GM/100ML-% IV SOLN
2.0000 g | Freq: Three times a day (TID) | INTRAVENOUS | Status: AC
  Administered 2019-08-26 – 2019-08-27 (×3): 2 g via INTRAVENOUS
  Filled 2019-08-26 (×3): qty 100

## 2019-08-26 MED ORDER — ONDANSETRON HCL 4 MG/2ML IJ SOLN: 4.0000 mg | Freq: Four times a day (QID) | INTRAMUSCULAR | Status: DC | PRN

## 2019-08-26 MED ORDER — FENTANYL CITRATE (PF) 100 MCG/2ML IJ SOLN
INTRAMUSCULAR | Status: DC | PRN
  Administered 2019-08-26 (×2): 25 ug via INTRAVENOUS

## 2019-08-26 MED ORDER — ONDANSETRON HCL 4 MG/2ML IJ SOLN
4.0000 mg | Freq: Once | INTRAMUSCULAR | Status: AC
  Administered 2019-08-26: 4 mg via INTRAVENOUS
  Filled 2019-08-26: qty 2

## 2019-08-26 MED ORDER — METHOCARBAMOL 1000 MG/10ML IJ SOLN
500.0000 mg | Freq: Four times a day (QID) | INTRAVENOUS | Status: DC | PRN
  Filled 2019-08-26: qty 5

## 2019-08-26 MED ORDER — IRBESARTAN 75 MG PO TABS
75.0000 mg | ORAL_TABLET | Freq: Every day | ORAL | Status: DC
  Administered 2019-08-27 – 2019-09-02 (×7): 75 mg via ORAL
  Filled 2019-08-26 (×7): qty 1

## 2019-08-26 MED ORDER — PHENYLEPHRINE HCL-NACL 10-0.9 MG/250ML-% IV SOLN
INTRAVENOUS | Status: DC | PRN
  Administered 2019-08-26: 75 ug/min via INTRAVENOUS
  Administered 2019-08-26: 50 ug/min via INTRAVENOUS

## 2019-08-26 MED ORDER — GABAPENTIN 100 MG PO CAPS
100.0000 mg | ORAL_CAPSULE | Freq: Three times a day (TID) | ORAL | Status: DC
  Administered 2019-08-26 – 2019-09-02 (×20): 100 mg via ORAL
  Filled 2019-08-26 (×20): qty 1

## 2019-08-26 MED ORDER — LACTATED RINGERS IV SOLN: INTRAVENOUS | Status: DC | PRN

## 2019-08-26 MED ORDER — ENOXAPARIN SODIUM 40 MG/0.4ML ~~LOC~~ SOLN
40.0000 mg | SUBCUTANEOUS | Status: DC
  Administered 2019-08-27 – 2019-09-02 (×7): 40 mg via SUBCUTANEOUS
  Filled 2019-08-26 (×7): qty 0.4

## 2019-08-26 MED ORDER — VANCOMYCIN HCL 1000 MG IV SOLR
INTRAVENOUS | Status: DC | PRN
  Administered 2019-08-26: 1000 mg

## 2019-08-26 MED ORDER — FENTANYL CITRATE (PF) 250 MCG/5ML IJ SOLN
INTRAMUSCULAR | Status: AC
  Filled 2019-08-26: qty 5

## 2019-08-26 MED ORDER — MORPHINE SULFATE (PF) 4 MG/ML IV SOLN
4.0000 mg | Freq: Once | INTRAVENOUS | Status: AC
  Administered 2019-08-26: 4 mg via INTRAVENOUS
  Filled 2019-08-26: qty 1

## 2019-08-26 MED ORDER — MORPHINE SULFATE (PF) 2 MG/ML IV SOLN
2.0000 mg | INTRAVENOUS | Status: DC | PRN
  Administered 2019-09-02: 2 mg via INTRAVENOUS
  Filled 2019-08-26: qty 1

## 2019-08-26 MED ORDER — PROPOFOL 10 MG/ML IV BOLUS
INTRAVENOUS | Status: DC | PRN
  Administered 2019-08-26: 20 mg via INTRAVENOUS

## 2019-08-26 MED ORDER — DOCUSATE SODIUM 100 MG PO CAPS
100.0000 mg | ORAL_CAPSULE | Freq: Two times a day (BID) | ORAL | Status: DC
  Administered 2019-08-26 – 2019-09-02 (×14): 100 mg via ORAL
  Filled 2019-08-26 (×14): qty 1

## 2019-08-26 SURGICAL SUPPLY — 71 items
BIT DRILL 4.3 (BIT) ×2
BIT DRILL 4.3X300MM (BIT) ×1 IMPLANT
BIT DRILL LONG 3.3 (BIT) ×4 IMPLANT
BIT DRILL QC 3.3X195 (BIT) ×2 IMPLANT
BNDG COHESIVE 6X5 TAN STRL LF (GAUZE/BANDAGES/DRESSINGS) ×2 IMPLANT
BNDG ELASTIC 6X10 VLCR STRL LF (GAUZE/BANDAGES/DRESSINGS) ×2 IMPLANT
BRUSH SCRUB EZ PLAIN DRY (MISCELLANEOUS) ×4 IMPLANT
CANISTER SUCT 3000ML PPV (MISCELLANEOUS) ×2 IMPLANT
CAP LOCK NCB (Cap) ×18 IMPLANT
CHLORAPREP W/TINT 26 (MISCELLANEOUS) ×2 IMPLANT
COVER SURGICAL LIGHT HANDLE (MISCELLANEOUS) ×2 IMPLANT
COVER WAND RF STERILE (DRAPES) ×2 IMPLANT
DRAPE C-ARM 42X72 X-RAY (DRAPES) ×2 IMPLANT
DRAPE C-ARMOR (DRAPES) ×2 IMPLANT
DRAPE HALF SHEET 40X57 (DRAPES) ×4 IMPLANT
DRAPE ORTHO SPLIT 77X108 STRL (DRAPES) ×2
DRAPE SURG 17X23 STRL (DRAPES) ×2 IMPLANT
DRAPE SURG ORHT 6 SPLT 77X108 (DRAPES) ×2 IMPLANT
DRAPE U-SHAPE 47X51 STRL (DRAPES) ×2 IMPLANT
DRSG ADAPTIC 3X8 NADH LF (GAUZE/BANDAGES/DRESSINGS) IMPLANT
DRSG MEPILEX BORDER 4X12 (GAUZE/BANDAGES/DRESSINGS) IMPLANT
DRSG MEPILEX BORDER 4X4 (GAUZE/BANDAGES/DRESSINGS) ×4 IMPLANT
DRSG MEPILEX BORDER 4X8 (GAUZE/BANDAGES/DRESSINGS) ×4 IMPLANT
DRSG PAD ABDOMINAL 8X10 ST (GAUZE/BANDAGES/DRESSINGS) ×6 IMPLANT
ELECT REM PT RETURN 9FT ADLT (ELECTROSURGICAL) ×2
ELECTRODE REM PT RTRN 9FT ADLT (ELECTROSURGICAL) ×1 IMPLANT
GAUZE SPONGE 4X4 12PLY STRL (GAUZE/BANDAGES/DRESSINGS) ×2 IMPLANT
GLOVE BIO SURGEON STRL SZ 6.5 (GLOVE) ×6 IMPLANT
GLOVE BIO SURGEON STRL SZ7.5 (GLOVE) ×8 IMPLANT
GLOVE BIOGEL PI IND STRL 6.5 (GLOVE) ×1 IMPLANT
GLOVE BIOGEL PI IND STRL 7.5 (GLOVE) ×1 IMPLANT
GLOVE BIOGEL PI INDICATOR 6.5 (GLOVE) ×1
GLOVE BIOGEL PI INDICATOR 7.5 (GLOVE) ×1
GOWN STRL REUS W/ TWL LRG LVL3 (GOWN DISPOSABLE) ×3 IMPLANT
GOWN STRL REUS W/TWL LRG LVL3 (GOWN DISPOSABLE) ×3
K-WIRE 2.0 (WIRE) ×1
K-WIRE FXSTD 280X2XNS SS (WIRE) ×1
KIT BASIN OR (CUSTOM PROCEDURE TRAY) ×2 IMPLANT
KIT TURNOVER KIT B (KITS) ×2 IMPLANT
KWIRE FXSTD 280X2XNS SS (WIRE) ×1 IMPLANT
NS IRRIG 1000ML POUR BTL (IV SOLUTION) ×2 IMPLANT
PACK TOTAL JOINT (CUSTOM PROCEDURE TRAY) ×2 IMPLANT
PAD ARMBOARD 7.5X6 YLW CONV (MISCELLANEOUS) ×4 IMPLANT
PAD CAST 4YDX4 CTTN HI CHSV (CAST SUPPLIES) ×1 IMPLANT
PADDING CAST COTTON 4X4 STRL (CAST SUPPLIES) ×1
PADDING CAST COTTON 6X4 STRL (CAST SUPPLIES) ×2 IMPLANT
PLATE DIST FEM 12H (Plate) ×2 IMPLANT
SCREW 5.0 70MM (Screw) ×4 IMPLANT
SCREW 5.0 80MM (Screw) ×4 IMPLANT
SCREW NCB 3.5X75X5X6.2XST (Screw) ×1 IMPLANT
SCREW NCB 4.0MX38M (Screw) ×2 IMPLANT
SCREW NCB 4.0MX50M (Screw) ×2 IMPLANT
SCREW NCB 4.0X36MM (Screw) ×2 IMPLANT
SCREW NCB 4.0X40MM (Screw) ×2 IMPLANT
SCREW NCB 5.0X75MM (Screw) ×1 IMPLANT
SPONGE LAP 18X18 RF (DISPOSABLE) IMPLANT
STAPLER VISISTAT 35W (STAPLE) ×2 IMPLANT
STRIP CLOSURE SKIN 1/2X4 (GAUZE/BANDAGES/DRESSINGS) ×2 IMPLANT
SUCTION FRAZIER HANDLE 10FR (MISCELLANEOUS) ×1
SUCTION TUBE FRAZIER 10FR DISP (MISCELLANEOUS) ×1 IMPLANT
SUT ETHILON 3 0 PS 1 (SUTURE) ×4 IMPLANT
SUT MNCRL AB 3-0 PS2 27 (SUTURE) ×2 IMPLANT
SUT VIC AB 0 CT1 27 (SUTURE)
SUT VIC AB 0 CT1 27XBRD ANBCTR (SUTURE) IMPLANT
SUT VIC AB 1 CT1 27 (SUTURE)
SUT VIC AB 1 CT1 27XBRD ANBCTR (SUTURE) IMPLANT
SUT VIC AB 2-0 CT1 27 (SUTURE) ×2
SUT VIC AB 2-0 CT1 TAPERPNT 27 (SUTURE) ×2 IMPLANT
TOWEL GREEN STERILE (TOWEL DISPOSABLE) ×4 IMPLANT
TRAY FOLEY MTR SLVR 16FR STAT (SET/KITS/TRAYS/PACK) IMPLANT
WATER STERILE IRR 1000ML POUR (IV SOLUTION) ×4 IMPLANT

## 2019-08-26 NOTE — ED Notes (Signed)
Patients brief changed at this time and pulled up in bed with Dee,RN

## 2019-08-26 NOTE — Care Management (Signed)
CM consult acknowledged to assist with any HH/DME needs. Awaiting PT/OT eval for DCP recommendations and will continue to follow.  Lomax Poehler MSN, RN, NCM-BC, ACM-RN [phone] 

## 2019-08-26 NOTE — Plan of Care (Signed)
  Problem: Education: Goal: Knowledge of General Education information will improve Description: Including pain rating scale, medication(s)/side effects and non-pharmacologic comfort measures Outcome: Progressing   Problem: Nutrition: Goal: Adequate nutrition will be maintained Outcome: Progressing   Problem: Coping: Goal: Level of anxiety will decrease Outcome: Progressing   Problem: Health Behavior/Discharge Planning: Goal: Ability to manage health-related needs will improve Outcome: Not Progressing   Problem: Activity: Goal: Risk for activity intolerance will decrease Outcome: Not Progressing

## 2019-08-26 NOTE — Transfer of Care (Signed)
Immediate Anesthesia Transfer of Care Note  Patient: Jaime [name]  Procedure(s) Performed: OPEN REDUCTION INTERNAL FIXATION (ORIF) DISTAL FEMUR FRACTURE (Left Leg Upper)  Patient Location: PACU  Anesthesia Type:Spinal  Level of Consciousness: drowsy  Airway & Oxygen Therapy: Patient Spontanous Breathing  Post-op Assessment: Report given to RN and Post -op Vital signs reviewed and stable  Post vital signs: Reviewed and stable  Last Vitals:  Vitals Value Taken Time  BP 95/34 08/26/19 1730  Temp    Pulse 74 08/26/19 1733  Resp 16 08/26/19 1733  SpO2 99 % 08/26/19 1733  Vitals shown include unvalidated device data.  Last Pain:  Vitals:   08/26/19 1410  TempSrc: Oral  PainSc:          Complications: No apparent anesthesia complications

## 2019-08-26 NOTE — Anesthesia Preprocedure Evaluation (Signed)
Anesthesia Evaluation  Patient identified by MRN, date of birth, ID band Patient confused    Reviewed: Allergy & Precautions, NPO status , Patient's Chart, lab work & pertinent test results  Airway Mallampati: II  TM Distance: >3 FB Neck ROM: Full    Dental no notable dental hx. (+) Poor Dentition, Lower Dentures, Partial Upper   Pulmonary neg pulmonary ROS,    Pulmonary exam normal breath sounds clear to auscultation       Cardiovascular hypertension, Pt. on medications Normal cardiovascular exam Rhythm:Regular Rate:Normal     Neuro/Psych Dementia negative neurological ROS     GI/Hepatic negative GI ROS, Neg liver ROS,   Endo/Other  Hypothyroidism   Renal/GU K+ 4.2 Cr 0.80     Musculoskeletal negative musculoskeletal ROS (+)   Abdominal   Peds  Hematology Hgb 9.5 Plt 265   Anesthesia Other Findings   Reproductive/Obstetrics                             Anesthesia Physical Anesthesia Plan  ASA: III  Anesthesia Plan: Spinal   Post-op Pain Management:    Induction:   PONV Risk Score and Plan: Treatment may vary due to age or medical condition  Airway Management Planned: Nasal Cannula and Natural Airway  Additional Equipment: None  Intra-op Plan:   Post-operative Plan:   Informed Consent: I have reviewed the patients History and Physical, chart, labs and discussed the procedure including the risks, benefits and alternatives for the proposed anesthesia with the patient or authorized representative who has indicated his/her understanding and acceptance.     Dental advisory given  Plan Discussed with:   Anesthesia Plan Comments:         Anesthesia Quick Evaluation

## 2019-08-26 NOTE — ED Notes (Signed)
Pt resting in bed at this time. NAD.

## 2019-08-26 NOTE — Consult Note (Signed)
Reason for Consult:Left periprosthetic distal femur fx Referring Physician: [name] is an 86 y.o. female.  HPI: Jaime [name] fell at the nursing facility where she resides. She apparently had immediate pain to her left hip and knee and couldn't get up. She was taken to [facility] where x-rays showed a distal femur periprosthetic fx. Orthopedics there recommended transfer to [facility] 2/2 the complexity of the injury. On my examination the patient is demented and confabulatory and cannot tell me what happened (above gleaned from chart); unsure if this represents a change.  Past Medical History:  Diagnosis Date  . Anxiety   . Dementia ([place])    per daughter  . Depression   . Hypertension   . Hypothyroidism     Past Surgical History:  Procedure Laterality Date  . ABDOMINAL HYSTERECTOMY    . appendectomy    . FEMUR IM NAIL Left 08/22/2019   Procedure: INTRAMEDULLARY (IM) NAIL FEMORAL, LEFT;  Surgeon: Lovell Sheehan, MD;  Location: [facility] ORS;  Service: Orthopedics;  Laterality: Left;  . MINOR HEMORRHOIDECTOMY    . OPEN REDUCTION INTERNAL FIXATION (ORIF) DISTAL RADIAL FRACTURE Left 09/11/2015   Procedure: OPEN REDUCTION INTERNAL FIXATION (ORIF) DISTAL RADIAL FRACTURE;  Surgeon: Hessie Knows, MD;  Location: [facility] ORS;  Service: Orthopedics;  Laterality: Left;  . TONSILLECTOMY    . TUBAL LIGATION      Family History  Problem Relation Age of Onset  . Breast cancer Maternal Aunt        2 mat aunts    Social History:  reports that she has never smoked. She has never used smokeless tobacco. She reports previous alcohol use. She reports that she does not use drugs.  Allergies:  Allergies  Allergen Reactions  . Memantine Other (See Comments)    Worsened confusion  . Donepezil Other (See Comments)    Poor appetite, weight loss    Medications: I have reviewed the patient's current medications.  Results for orders placed or performed during the hospital encounter of 08/25/19 (from the  past 48 hour(s))  CBC     Status: Abnormal   Collection Time: 08/25/19  4:33 PM  Result Value Ref Range   WBC 8.7 4.0 - 10.5 K/uL   RBC 3.15 (L) 3.87 - 5.11 MIL/uL   Hemoglobin 9.7 (L) 12.0 - 15.0 g/dL   HCT 29.3 (L) 36.0 - 46.0 %   MCV 93.0 80.0 - 100.0 fL   MCH 30.8 26.0 - 34.0 pg   MCHC 33.1 30.0 - 36.0 g/dL   RDW 12.7 11.5 - 15.5 %   Platelets 297 150 - 400 K/uL   nRBC 0.0 0.0 - 0.2 %    Comment: Performed at [facility], [address] [place], [place] [address]  Basic metabolic panel     Status: Abnormal   Collection Time: 08/25/19  4:33 PM  Result Value Ref Range   Sodium 132 (L) 135 - 145 mmol/L   Potassium 4.2 3.5 - 5.1 mmol/L   Chloride 98 98 - 111 mmol/L   CO2 22 22 - 32 mmol/L   Glucose, Bld 125 (H) 70 - 99 mg/dL   BUN 11 8 - 23 mg/dL   Creatinine, Ser 0.62 0.44 - 1.00 mg/dL   Calcium 8.7 (L) 8.9 - 10.3 mg/dL   GFR calc non Af Amer >60 >60 mL/min   GFR calc Af Amer >60 >60 mL/min   Anion gap 12 5 - 15    Comment: Performed at [facility], [address] [place], [place] [address]  Respiratory Panel by RT PCR (Flu A&B, Covid) - Nasopharyngeal Swab     Status: None   Collection Time: 08/25/19  6:52 PM   Specimen: Nasopharyngeal Swab  Result Value Ref Range   SARS Coronavirus 2 by RT PCR NEGATIVE NEGATIVE    Comment: (NOTE) SARS-CoV-2 target nucleic acids are NOT DETECTED. The SARS-CoV-2 RNA is generally detectable in upper respiratoy specimens during the acute phase of infection. The lowest concentration of SARS-CoV-2 viral copies this assay can detect is 131 copies/mL. A negative result does not preclude SARS-Cov-2 infection and should not be used as the sole basis for treatment or other patient management decisions. A negative result may occur with  improper specimen collection/handling, submission of specimen other than nasopharyngeal swab, presence of viral mutation(s) within the areas targeted by this assay, and inadequate number of  viral copies (<131 copies/mL). A negative result [name] be combined with clinical observations, patient history, and epidemiological information. The expected result is Negative. Fact Sheet for Patients:  https://www.moore.com/ Fact Sheet for Healthcare Providers:  https://www.young.biz/ This test is not yet ap proved or cleared by the [place] FDA and  has been authorized for detection and/or diagnosis of SARS-CoV-2 by FDA under an Emergency Use Authorization (EUA). This EUA will remain  in effect (meaning this test can be used) for the duration of the COVID-19 declaration under Section 564(b)(1) of the Act, 21 U.S.C. section 360bbb-3(b)(1), unless the authorization is terminated or revoked sooner.    Influenza A by PCR NEGATIVE NEGATIVE   Influenza B by PCR NEGATIVE NEGATIVE    Comment: (NOTE) The Xpert Xpress SARS-CoV-2/FLU/RSV assay is intended as an aid in  the diagnosis of influenza from Nasopharyngeal swab specimens and  should not be used as a sole basis for treatment. Nasal washings and  aspirates are unacceptable for Xpert Xpress SARS-CoV-2/FLU/RSV  testing. Fact Sheet for Patients: https://www.moore.com/ Fact Sheet for Healthcare Providers: https://www.young.biz/ This test is not yet approved or cleared by the [place] FDA and  has been authorized for detection and/or diagnosis of SARS-CoV-2 by  FDA under an Emergency Use Authorization (EUA). This EUA will remain  in effect (meaning this test can be used) for the duration of the  Covid-19 declaration under Section 564(b)(1) of the Act, 21  U.S.C. section 360bbb-3(b)(1), unless the authorization is  terminated or revoked. Performed at [facility], [address] [place], [place] [address]     DG Chest 1 View  Result Date: 08/25/2019 CLINICAL DATA:  Left hip pain EXAM: CHEST  1 VIEW COMPARISON:  08/21/2019 FINDINGS: The  heart size and mediastinal contours are within normal limits. No new focal airspace consolidation, pleural effusion, or pneumothorax. The visualized skeletal structures are unremarkable. IMPRESSION: No active disease. Electronically Signed   By: Duanne Guess D.O.   On: 08/25/2019 18:15   DG Knee Complete 4 Views Left  Result Date: 08/25/2019 CLINICAL DATA:  Fall, left leg pain EXAM: LEFT FEMUR 2 VIEWS; LEFT KNEE - COMPLETE 4+ VIEW COMPARISON:  08/21/2019 FINDINGS: Interval ORIF of a intertrochanteric fracture of the proximal left femur with antegrade intramedullary nail, lag screw, and 2 distal interlocking screws. There is a new oblique perihardware fracture involving the distal left femoral metadiaphysis. There is approximately 1 cm of lateral and 1.5 cm of posterior displacement at the fracture site. The fracture line may extend intra-articularly to the patellofemoral compartment anteriorly. Alignment at the knee joint is maintained. Postsurgical  changes within the soft tissues. Bones are demineralized. IMPRESSION: 1. New displaced obliquely oriented perihardware fracture of the distal left femoral metadiaphysis. 2. Interval ORIF of a intertrochanteric fracture of the proximal left femur. Electronically Signed   By: Duanne Guess D.O.   On: 08/25/2019 18:19   DG Femur Min 2 Views Left  Result Date: 08/25/2019 CLINICAL DATA:  Fall, left leg pain EXAM: LEFT FEMUR 2 VIEWS; LEFT KNEE - COMPLETE 4+ VIEW COMPARISON:  08/21/2019 FINDINGS: Interval ORIF of a intertrochanteric fracture of the proximal left femur with antegrade intramedullary nail, lag screw, and 2 distal interlocking screws. There is a new oblique perihardware fracture involving the distal left femoral metadiaphysis. There is approximately 1 cm of lateral and 1.5 cm of posterior displacement at the fracture site. The fracture line may extend intra-articularly to the patellofemoral compartment anteriorly. Alignment at the knee joint is  maintained. Postsurgical changes within the soft tissues. Bones are demineralized. IMPRESSION: 1. New displaced obliquely oriented perihardware fracture of the distal left femoral metadiaphysis. 2. Interval ORIF of a intertrochanteric fracture of the proximal left femur. Electronically Signed   By: Duanne Guess D.O.   On: 08/25/2019 18:19    Review of Systems  Unable to perform ROS: Dementia   There were no vitals taken for this visit. Physical Exam  Constitutional: She appears well-developed and well-nourished. No distress.  HENT:  Head: Normocephalic and atraumatic.  Eyes: Conjunctivae are normal. Right eye exhibits no discharge. Left eye exhibits no discharge. No scleral icterus.  Cardiovascular: Normal rate and regular rhythm.  Respiratory: Effort normal. No respiratory distress.  Musculoskeletal:     Cervical back: Normal range of motion.     Comments: LLE No traumatic wounds, ecchymosis, or rash  TTP thigh/knee  No ankle effusion  Sens DPN, SPN, TN grossly intact  Motor EHL, ext, flex, evers grossly intact  DP 1+, PT 1+, No significant edema  Neurological: She is alert.  Skin: Skin is warm and dry. She is not diaphoretic.  Psychiatric: She has a normal mood and affect. Her behavior is normal.    Assessment/Plan: Left periprosthetic distal femur fx -- Plan ORIF this afternoon by Dr. Jena Gauss. Please keep NPO. Multiple medical problems including anxiety, dementia, hypertension, and recent left hip surgery 08/21/2019 -- per primary service    Jaime Caldron, [name] [phone] 08/26/2019, 1:11 PM

## 2019-08-26 NOTE — ED Notes (Signed)
Pt resting comfortably in bed at this time,able to see from nurses station family member no longer with pt. Pt has not attempted to get out of bed,

## 2019-08-26 NOTE — Progress Notes (Signed)
RN called [facility] office as requested to update about pt arrival and lack of orders. Spoke with assistant and confirmed. Will continue to monitor Jaime [name] Mena Goes, RN 08/26/2019 1:08 PM

## 2019-08-26 NOTE — H&P (Addendum)
History and Physical    Jaime [name] ZOX:[id number] DOB: [birth year] DOA: 08/26/2019  Referring MD/NP/PA: Pearson GrippeJames Kim, MD PCP: Mickey Farberhies, David, MD  Patient coming from: [facility]  Chief Complaint: Fall  I have personally briefly reviewed patient's old medical records in [facility]   HPI: Jaime [name] is a 86 y.o. female with medical history significant of hypertension, hypothyroidism, dementia, depression, anxiety, and recent left hip surgery on 1/4.  She presented to [facility] yesterday after having a fall with complaints of left leg pain.  Patient does not appear to be a reliable historian due to her history of dementia.  She complains of pain in the left leg especially with any movement.  Denies any other complaints at this time.  It is unknown if she lost consciousness or had any trauma to her head.  X-rays of the leg revealed a distal femur periprosthetic fracture.  Transfer was requested to [facility] due to the complexity of the injury.  Dr. Jena GaussHaddix of orthopedics agreed to see in consultation once the patient arrived to the hospital.  Labs from 1/7 revealed hemoglobin 9.7   ED Course: As seen above  Review of Systems  Unable to perform ROS: Dementia  Musculoskeletal: Positive for falls and myalgias.    Past Medical History:  Diagnosis Date  . Anxiety   . Dementia (HCC)    per daughter  . Depression   . Hypertension   . Hypothyroidism     Past Surgical History:  Procedure Laterality Date  . ABDOMINAL HYSTERECTOMY    . appendectomy    . FEMUR IM NAIL Left 08/22/2019   Procedure: INTRAMEDULLARY (IM) NAIL FEMORAL, LEFT;  Surgeon: Lyndle HerrlichBowers, James R, MD;  Location: [facility] ORS;  Service: Orthopedics;  Laterality: Left;  . MINOR HEMORRHOIDECTOMY    . OPEN REDUCTION INTERNAL FIXATION (ORIF) DISTAL RADIAL FRACTURE Left 09/11/2015   Procedure: OPEN REDUCTION INTERNAL FIXATION (ORIF) DISTAL RADIAL FRACTURE;  Surgeon: Kennedy BuckerMichael  Menz, MD;  Location: [facility] ORS;  Service: Orthopedics;  Laterality: Left;  . TONSILLECTOMY    . TUBAL LIGATION       reports that she has never smoked. She has never used smokeless tobacco. She reports previous alcohol use. She reports that she does not use drugs.  Allergies  Allergen Reactions  . Memantine Other (See Comments)    Worsened confusion  . Donepezil Other (See Comments)    Poor appetite, weight loss    Family History  Problem Relation Age of Onset  . Breast cancer Maternal Aunt        2 mat aunts    Prior to Admission medications   Medication Sig Start Date End Date Taking? Authorizing Provider  Cholecalciferol 50 MCG (2000 UT) CAPS Take 2,000 Units by mouth daily.    [provider]  cyanocobalamin 1000 MCG tablet Take 1,000 mcg by mouth daily.     [provider]  enoxaparin (LOVENOX) 40 MG/0.4ML injection Inject 0.4 mLs (40 mg total) into the skin daily. 08/25/19 09/24/19  Dhungel, Theda BelfastNishant, MD  HYDROcodone-acetaminophen (NORCO/VICODIN) 5-325 MG tablet Take 1 tablet by mouth every 6 (six) hours as needed for moderate pain. 08/24/19   Dhungel, Nishant, MD  levothyroxine (SYNTHROID, LEVOTHROID) 137 MCG tablet Take 137 mcg by mouth daily before breakfast.    [provider]  mirtazapine (REMERON) 15 MG tablet Take 15 mg by mouth at bedtime. 06/16/19   [provider]  polyethylene glycol (MIRALAX) 17 g packet Take 17  g by mouth daily. 08/24/19   Dhungel, Nishant, MD  telmisartan (MICARDIS) 80 MG tablet Take 80 mg by mouth daily.    [provider]    Physical Exam:  Constitutional: Elderly female who appears to be in no acute distress at this time Vitals:   08/26/19 1410 08/26/19 1455 08/26/19 1730  BP: (!) 155/60  (!) (P) 95/34  Pulse: 92    Resp: 16  15  Temp: 98.5 F (36.9 C)  (P) 97.8 F (36.6 C)  TempSrc: Oral    SpO2: 96%  (P) 97%  Weight:  70.9 kg   Height:  5' 7.99" (1.727 m)    Eyes: PERRL, lids and conjunctivae  normal ENMT: Mucous membranes are dry. Posterior pharynx clear of any exudate or lesions.  Neck: normal, supple, no masses, no thyromegaly Respiratory: clear to auscultation bilaterally, no wheezing, no crackles. Normal respiratory effort. No accessory muscle use.  Cardiovascular: Regular rate and rhythm, no murmurs / rubs / gallops. No extremity edema. 2+ pedal pulses. No carotid bruits.  Abdomen: no tenderness, no masses palpated. No hepatosplenomegaly. Bowel sounds positive.  Musculoskeletal: no clubbing / cyanosis.  And with any movement of the left leg Skin: no rashes, lesions, ulcers. No induration Neurologic: CN 2-12 grossly intact. Sensation intact, DTR normal. Strength 5/5 in all 4.  Psychiatric: Poor recent memory.  Past memories intact.  Alert and oriented x person.    Labs on Admission: I have personally reviewed following labs and imaging studies  CBC: Recent Labs  Lab 08/21/19 1941 08/22/19 0413 08/24/19 0407 08/25/19 1633  WBC 6.4 7.6 8.1 8.7  NEUTROABS 4.1  --   --   --   HGB 12.8 10.8* 9.1* 9.7*  HCT 38.7 32.5* 26.9* 29.3*  MCV 92.8 91.8 92.1 93.0  PLT 161 226 185 297   Basic Metabolic Panel: Recent Labs  Lab 08/21/19 2110 08/22/19 0413 08/24/19 0407 08/25/19 1633  NA 131* 132* 130* 132*  K 4.3 4.4 4.2 4.2  CL 98 102 99 98  CO2 24 23 24 22   GLUCOSE 123* 138* 134* 125*  BUN 13 12 15 11   CREATININE 0.68 0.52 0.67 0.62  CALCIUM 8.9 8.3* 8.3* 8.7*   GFR: Estimated Creatinine Clearance: 52.8 mL/min (by C-G formula based on SCr of 0.62 mg/dL). Liver Function Tests: Recent Labs  Lab 08/21/19 2110 08/22/19 0413  AST 24 21  ALT 17 15  ALKPHOS 85 72  BILITOT 0.5 0.9  PROT 6.8 5.9*  ALBUMIN 3.7 3.3*   No results for input(s): LIPASE, AMYLASE in the last 168 hours. No results for input(s): AMMONIA in the last 168 hours. Coagulation Profile: No results for input(s): INR, PROTIME in the last 168 hours. Cardiac Enzymes: No results for input(s):  CKTOTAL, CKMB, CKMBINDEX, TROPONINI in the last 168 hours. BNP (last 3 results) No results for input(s): PROBNP in the last 8760 hours. HbA1C: No results for input(s): HGBA1C in the last 72 hours. CBG: No results for input(s): GLUCAP in the last 168 hours. Lipid Profile: No results for input(s): CHOL, HDL, LDLCALC, TRIG, CHOLHDL, LDLDIRECT in the last 72 hours. Thyroid Function Tests: No results for input(s): TSH, T4TOTAL, FREET4, T3FREE, THYROIDAB in the last 72 hours. Anemia Panel: No results for input(s): VITAMINB12, FOLATE, FERRITIN, TIBC, IRON, RETICCTPCT in the last 72 hours. Urine analysis:    Component Value Date/Time   COLORURINE YELLOW (A) 04/19/2018 0728   APPEARANCEUR CLEAR (A) 04/19/2018 0728   LABSPEC 1.013 04/19/2018 0728   PHURINE  7.0 04/19/2018 0728   GLUCOSEU NEGATIVE 04/19/2018 0728   HGBUR SMALL (A) 04/19/2018 0728   BILIRUBINUR NEGATIVE 04/19/2018 0728   KETONESUR NEGATIVE 04/19/2018 0728   PROTEINUR NEGATIVE 04/19/2018 0728   NITRITE NEGATIVE 04/19/2018 0728   LEUKOCYTESUR NEGATIVE 04/19/2018 0728   Sepsis Labs: Recent Results (from the past 240 hour(s))  SARS CORONAVIRUS 2 (TAT 6-24 HRS) Nasopharyngeal Nasopharyngeal Swab     Status: None   Collection Time: 08/21/19  7:41 PM   Specimen: Nasopharyngeal Swab  Result Value Ref Range Status   SARS Coronavirus 2 NEGATIVE NEGATIVE Final    Comment: (NOTE) SARS-CoV-2 target nucleic acids are NOT DETECTED. The SARS-CoV-2 RNA is generally detectable in upper and lower respiratory specimens during the acute phase of infection. Negative results do not preclude SARS-CoV-2 infection, do not rule out co-infections with other pathogens, and should not be used as the sole basis for treatment or other patient management decisions. Negative results must be combined with clinical observations, patient history, and epidemiological information. The expected result is Negative. Fact Sheet for  Patients: HairSlick.no Fact Sheet for Healthcare Providers: quierodirigir.com This test is not yet approved or cleared by the [place] FDA and  has been authorized for detection and/or diagnosis of SARS-CoV-2 by FDA under an Emergency Use Authorization (EUA). This EUA will remain  in effect (meaning this test can be used) for the duration of the COVID-19 declaration under Section 56 4(b)(1) of the Act, 21 U.S.C. section 360bbb-3(b)(1), unless the authorization is terminated or revoked sooner. Performed at [facility], [address], [place], [place] [address]   Respiratory Panel by RT PCR (Flu A&B, Covid) - Nasopharyngeal Swab     Status: None   Collection Time: 08/25/19  6:52 PM   Specimen: Nasopharyngeal Swab  Result Value Ref Range Status   SARS Coronavirus 2 by RT PCR NEGATIVE NEGATIVE Final    Comment: (NOTE) SARS-CoV-2 target nucleic acids are NOT DETECTED. The SARS-CoV-2 RNA is generally detectable in upper respiratoy specimens during the acute phase of infection. The lowest concentration of SARS-CoV-2 viral copies this assay can detect is 131 copies/mL. A negative result does not preclude SARS-Cov-2 infection and should not be used as the sole basis for treatment or other patient management decisions. A negative result may occur with  improper specimen collection/handling, submission of specimen other than nasopharyngeal swab, presence of viral mutation(s) within the areas targeted by this assay, and inadequate number of viral copies (<131 copies/mL). A negative result must be combined with clinical observations, patient history, and epidemiological information. The expected result is Negative. Fact Sheet for Patients:  https://www.moore.com/ Fact Sheet for Healthcare Providers:  https://www.young.biz/ This test is not yet ap proved or cleared by the [place] FDA  and  has been authorized for detection and/or diagnosis of SARS-CoV-2 by FDA under an Emergency Use Authorization (EUA). This EUA will remain  in effect (meaning this test can be used) for the duration of the COVID-19 declaration under Section 564(b)(1) of the Act, 21 U.S.C. section 360bbb-3(b)(1), unless the authorization is terminated or revoked sooner.    Influenza A by PCR NEGATIVE NEGATIVE Final   Influenza B by PCR NEGATIVE NEGATIVE Final    Comment: (NOTE) The Xpert Xpress SARS-CoV-2/FLU/RSV assay is intended as an aid in  the diagnosis of influenza from Nasopharyngeal swab specimens and  should not be used as a sole basis for treatment. Nasal washings and  aspirates are unacceptable for Xpert Xpress SARS-CoV-2/FLU/RSV  testing. Fact  Sheet for Patients: https://www.moore.com/ Fact Sheet for Healthcare Providers: https://www.young.biz/ This test is not yet approved or cleared by the [place] FDA and  has been authorized for detection and/or diagnosis of SARS-CoV-2 by  FDA under an Emergency Use Authorization (EUA). This EUA will remain  in effect (meaning this test can be used) for the duration of the  Covid-19 declaration under Section 564(b)(1) of the Act, 21  U.S.C. section 360bbb-3(b)(1), unless the authorization is  terminated or revoked. Performed at [facility], [address] [place], [place] [address]      Radiological Exams on Admission: DG Chest 1 View  Result Date: 08/25/2019 CLINICAL DATA:  Left hip pain EXAM: CHEST  1 VIEW COMPARISON:  08/21/2019 FINDINGS: The heart size and mediastinal contours are within normal limits. No new focal airspace consolidation, pleural effusion, or pneumothorax. The visualized skeletal structures are unremarkable. IMPRESSION: No active disease. Electronically Signed   By: Duanne Guess D.O.   On: 08/25/2019 18:15   DG Knee Complete 4 Views Left  Result Date:  08/25/2019 CLINICAL DATA:  Fall, left leg pain EXAM: LEFT FEMUR 2 VIEWS; LEFT KNEE - COMPLETE 4+ VIEW COMPARISON:  08/21/2019 FINDINGS: Interval ORIF of a intertrochanteric fracture of the proximal left femur with antegrade intramedullary nail, lag screw, and 2 distal interlocking screws. There is a new oblique perihardware fracture involving the distal left femoral metadiaphysis. There is approximately 1 cm of lateral and 1.5 cm of posterior displacement at the fracture site. The fracture line may extend intra-articularly to the patellofemoral compartment anteriorly. Alignment at the knee joint is maintained. Postsurgical changes within the soft tissues. Bones are demineralized. IMPRESSION: 1. New displaced obliquely oriented perihardware fracture of the distal left femoral metadiaphysis. 2. Interval ORIF of a intertrochanteric fracture of the proximal left femur. Electronically Signed   By: Duanne Guess D.O.   On: 08/25/2019 18:19   DG Femur Min 2 Views Left  Result Date: 08/25/2019 CLINICAL DATA:  Fall, left leg pain EXAM: LEFT FEMUR 2 VIEWS; LEFT KNEE - COMPLETE 4+ VIEW COMPARISON:  08/21/2019 FINDINGS: Interval ORIF of a intertrochanteric fracture of the proximal left femur with antegrade intramedullary nail, lag screw, and 2 distal interlocking screws. There is a new oblique perihardware fracture involving the distal left femoral metadiaphysis. There is approximately 1 cm of lateral and 1.5 cm of posterior displacement at the fracture site. The fracture line may extend intra-articularly to the patellofemoral compartment anteriorly. Alignment at the knee joint is maintained. Postsurgical changes within the soft tissues. Bones are demineralized. IMPRESSION: 1. New displaced obliquely oriented perihardware fracture of the distal left femoral metadiaphysis. 2. Interval ORIF of a intertrochanteric fracture of the proximal left femur. Electronically Signed   By: Duanne Guess D.O.   On: 08/25/2019 18:19     EKG: Independently reviewed.  Sinus rhythm 87 bpm.  Assessment/Plan Closed fracture of left distal femur secondary to fall: Acute.  Patient presents with after having a fall at the skilled nursing facility with complaints of left leg pain.  Found to have new displaced oblique.  Hardware fracture of the distal left femoral metadiaphysis.  Dr. Jena Gauss of orthopedics consulted and plan on taking him to surgery today.  Patient's dementia and likely contributing factor. -Admit to MedSurg bed -Hip fracture order set utilized -Hydrocodone/morphine as needed for pain -Transitions of care team consulted -Appreciate orthopedic consultative services  Normocytic anemia: Acute.  Hemoglobin 9.5 which appears near baseline at previous discharge.  Patient's hemoglobin previously have been  within normal limits on 1/3.  The drop in her hemoglobin appears secondary to the previous left hip surgery on 1/4. -Continue to monitor  Dementia with behavioral disturbance -Set bed alarm  Hypothyroidism -Check TSH -Continue levothyroxine   Recent left hip surgery: Patient status post surgical correction of a fracture of the distal epiphysis of the left on 08/21/2018 at [facility] by Dr. Harlow Mares  DVT prophylaxis: Lovenox Code Status: Full Family Communication: No family present at bedside Disposition Plan: Likely discharge back to skilled nursing facility once medically stable Consults called: Orthopedic surgery Admission status: Inpatient  Norval Morton MD [facility] Pager [phone]   If 7PM-7AM, please contact night-coverage [email or web address] Password [facility]  08/26/2019, 2:06 PM

## 2019-08-26 NOTE — Op Note (Signed)
Orthopaedic Surgery Operative Note (CSN: [id number] ) Date of Surgery: 08/26/2019  Admit Date: 08/26/2019   Diagnoses: Pre-Op Diagnoses: Left periprosthetic distal femur fracture  Post-Op Diagnosis: Same  Procedures: 1. CPT 27511-Open reduction internal fixation of left distal femur fracture 2. CPT 20680-Removal of hardware left femur  Surgeons : Primary: Jeremie Giangrande, Gillie Manners, MD  Assistant: Ulyses Southward, PA-C  Location: OR 3   Anesthesia: Spinal with sedation  Antibiotics: Ancef 2g preop, 1 g vancomycin powder applied topically  Tourniquet time:None  Estimated Blood Loss:50 mL  Complications:None   Specimens:None   Implants: Implant Name Type Inv. Item Serial No. Manufacturer Lot No. LRB No. Used Action  PLATE DIST FEM 78G - [id number] Plate PLATE DIST FEM 57Q  ZIMMER [employer](ORTH,TRAU,BIO,SG)  Left 1 Implanted  SCREW CORTICAL NCB 5.0X20 - [id number] Screw SCREW CORTICAL NCB 5.0X20  [employer](ORTH,TRAU,BIO,SG)  Left 2 Implanted  SCREW 5.0 - [id number] Screw SCREW 5.0  [employer](ORTH,TRAU,BIO,SG)  Left 2 Implanted  SCREW NCB 5.0X75MM - [id number] Screw SCREW NCB 5.0X75MM  [employer](ORTH,TRAU,BIO,SG)  Left 1 Implanted  CAP LOCK NCB - [id number] Cap CAP LOCK NCB  [employer](ORTH,TRAU,BIO,SG)  Left 9 Implanted  SCREW NCB 4.0X36MM - [id number] Screw SCREW NCB 4.0X36MM  [employer](ORTH,TRAU,BIO,SG)  Left 1 Implanted  SCREW NCB 4.0MX38M - [id number] Screw SCREW NCB 4.0MX38M  ZIM[employer]ON(ORTH,TRAU,BIO,SG)  Left 1 Implanted  SCREW NCB 4.0X40MM - [id number] Screw SCREW NCB 4.0X40MM  ZIM[employer]ON(ORTH,TRAU,BIO,SG)  Left 1 Implanted  SCREW NCB 4.0MX50M - [id number] Screw SCREW NCB 4.0MX50M  ZIMMER [employer]ON(ORTH,TRAU,BIO,SG)  Left 1 Implanted  SCREW LOCKING 5.0MX50M - [id number] Screw SCREW LOCKING 5.0MX50M  [employer]ES [employer] [id number] Left 1 Explanted  SCREW LOCKING 5.0X48MM - [id number] Screw SCREW LOCKING 5.0X48MM  SYN[employer]ES [employer] [id number] Left 1 Explanted     Indications for  Surgery: 86 year old female who had a previous intertrochanteric femur fracture fixed on January 4 of this year.  She sustained a ground-level fall at her nursing facility.  She presented with a left periprosthetic distal femur fracture around the distal interlocking screws.  Due to the unstable nature of his injury I recommended proceeding for open reduction internal fixation of her distal femur fracture.  Risks and benefits were discussed with the patient's daughter-in-law.  Risks included but not limited to bleeding, infection, malunion, nonunion, hardware failure, hardware irritation, nerve and blood vessel injury, DVT, even the possibility anesthetic complications.  The patient's daughter-in-law agreed to proceed with surgery and consent was obtained.  Operative Findings: 1.  Removal of previous 2 distal interlocking screws 2.  Open reduction internal fixation using [employer] 12 hole NCB distal femoral locking plate  Procedure: The patient was identified in the preoperative holding area. Consent was confirmed with the patient and their family and all questions were answered. The operative extremity was marked after confirmation with the patient. she was then brought back to the operating room by our anesthesia colleagues.  She was given a spinal anesthetic and then she was carefully transferred over to a radiolucent flat top table.  A bump was placed under her operative hip.  The left lower extremity was then prepped and draped in usual sterile fashion.  A timeout was performed to verify the patient, the procedure, and the extremity.  Preoperative antibiotics were dosed.  Fluoroscopic imaging was obtained to show the unstable nature of her injury.  The previous staples for the distal interlocks were removed.  I then successfully remove the lateral to medial distal interlocks without difficulty.  I  then extended the small incision distally to be able access the lateral aspect of the distal femur.   I carried it through skin and subcutaneous tissue.  I split the IT band in line with my incision.  I mobilized the vastus lateralis to visualize the lateral aspect of the metaphysis.  I reduction maneuver was performed over the triangle and the fracture was appropriately aligned on the AP and lateral view.  I attempted to keep the soft tissue envelope intact and not strip it and try to go for anatomic reduction.  I chose a 12 hole [employer]immer [employer] NCB distal femoral locking plate.  I placed it on the jig and slid it submuscularly along the lateral cortex of the femur.  I held it provisionally distally with a 2.0 mm guidewire and a percutaneous incision was made proximally to place a 3.3 mm drill bit I was able to place it bicortically just anterior to the femoral nail.  Once I confirmed alignment and position of the plate I then placed 5.0 millimeter screws into the distal segment.  I then drilled and placed bicortical 4.0 millimeter screws into the femoral shaft just anterior to the femoral nail.  The jig was then removed and locking caps were placed on the 2 distal shaft screws.  I then placed 3 more 5.0 millimeter screws into the distal segment.  I then obtained a lateral view to see if I was able to place a screw through the plate and nail.  I was able to direct a drill through the lateral cortex of the femur crossed through the nail into the medial cortex.  A 4.0 millimeter screw was placed.  Locking caps were placed on all the distal screws.  Final fluoroscopic imaging was obtained.  The incision was copiously irrigated.  A gram of vancomycin powder was placed into the incision.  The IT band was closed with 0 Vicryl suture.  The skin was closed with 2-0 Vicryl and 3-0 Monocryl.  Steri strips were placed.  A sterile dressing was placed.  The patient was then transferred to a regular bed and taken to the PACU in stable condition.  Post Op Plan/Instructions: Patient will be weightbearing as tolerated to the  left lower extremity.  She will receive postoperative Ancef.  She will receive Lovenox for DVT prophylaxis.  We will mobilize her physical and occupational therapy and she will discharge back to the skilled nursing facility.  I was present and performed the entire surgery.  Patrecia Pace, PA-C did assist me throughout the case. An assistant was necessary given the difficulty in approach, maintenance of reduction and ability to instrument the fracture.   Katha Hamming, MD [facility]

## 2019-08-26 NOTE — ED Notes (Signed)
Pt resting in bed at this time. Denies any needs.

## 2019-08-26 NOTE — Anesthesia Postprocedure Evaluation (Signed)
Anesthesia Post Note  Patient: Jaime [name]  Procedure(s) Performed: OPEN REDUCTION INTERNAL FIXATION (ORIF) DISTAL FEMUR FRACTURE (Left Leg Upper)     Patient location during evaluation: PACU Anesthesia Type: Spinal Level of consciousness: oriented and awake and alert Pain management: pain level controlled Vital Signs Assessment: post-procedure vital signs reviewed and stable Respiratory status: spontaneous breathing, respiratory function stable and patient connected to nasal cannula oxygen Cardiovascular status: blood pressure returned to baseline and stable Postop Assessment: no headache, no backache and no apparent nausea or vomiting Anesthetic complications: no    Last Vitals:  Vitals:   08/26/19 1759 08/26/19 1800  BP: 119/63   Pulse:  84  Resp: (!) 21 14  Temp:    SpO2:  100%    Last Pain:  Vitals:   08/26/19 1800  TempSrc:   PainSc: 0-No pain                 Trevor Iha

## 2019-08-26 NOTE — ED Notes (Signed)
Pt asleep in bed.

## 2019-08-26 NOTE — Anesthesia Procedure Notes (Signed)
Spinal  Patient location during procedure: OR Start time: 08/26/2019 4:05 PM End time: 08/26/2019 4:12 PM Staffing Anesthesiologist: Trevor Iha, MD Preanesthetic Checklist Completed: patient identified, IV checked, site marked, risks and benefits discussed, surgical consent, monitors and equipment checked, pre-op evaluation and timeout performed Spinal Block Patient position: right lateral decubitus Prep: DuraPrep Patient monitoring: heart rate, cardiac monitor, continuous pulse ox and blood pressure Approach: midline Location: L3-4 Injection technique: single-shot Needle Needle type: Sprotte  Needle gauge: 24 G Needle length: 9 cm Assessment Sensory level: T4 Additional Notes 1 attempt pt tolerated procedure well

## 2019-08-26 NOTE — ED Notes (Signed)
Pt given warm blankets at this time 

## 2019-08-26 NOTE — ED Notes (Signed)
Pt given Orange juice at this time

## 2019-08-27 ENCOUNTER — Other Ambulatory Visit: Payer: Self-pay

## 2019-08-27 DIAGNOSIS — I10 Essential (primary) hypertension: Secondary | ICD-10-CM | POA: Diagnosis present

## 2019-08-27 LAB — CBC
HCT: 25.1 % — ABNORMAL LOW (ref 36.0–46.0)
Hemoglobin: 8.2 g/dL — ABNORMAL LOW (ref 12.0–15.0)
MCH: 30.6 pg (ref 26.0–34.0)
MCHC: 32.7 g/dL (ref 30.0–36.0)
MCV: 93.7 fL (ref 80.0–100.0)
Platelets: 250 10*3/uL (ref 150–400)
RBC: 2.68 MIL/uL — ABNORMAL LOW (ref 3.87–5.11)
RDW: 12.8 % (ref 11.5–15.5)
WBC: 7.5 10*3/uL (ref 4.0–10.5)
nRBC: 0 % (ref 0.0–0.2)

## 2019-08-27 LAB — VITAMIN D 25 HYDROXY (VIT D DEFICIENCY, FRACTURES): Vit D, 25-Hydroxy: 31.58 ng/mL (ref 30–100)

## 2019-08-27 LAB — BASIC METABOLIC PANEL
Anion gap: 11 (ref 5–15)
BUN: 13 mg/dL (ref 8–23)
CO2: 21 mmol/L — ABNORMAL LOW (ref 22–32)
Calcium: 8.3 mg/dL — ABNORMAL LOW (ref 8.9–10.3)
Chloride: 100 mmol/L (ref 98–111)
Creatinine, Ser: 0.74 mg/dL (ref 0.44–1.00)
GFR calc Af Amer: >60 mL/min (ref >60)
GFR calc non Af Amer: >60 mL/min (ref >60)
Glucose, Bld: 149 mg/dL — ABNORMAL HIGH (ref 70–99)
Potassium: 4 mmol/L (ref 3.5–5.1)
Sodium: 132 mmol/L — ABNORMAL LOW (ref 135–145)

## 2019-08-27 NOTE — Progress Notes (Addendum)
PROGRESS NOTE    Jaime [name]  EGB:[id number] DOB: [birth year] DOA: 08/26/2019 PCP: Mickey Farber, MD     Brief Narrative:  Patient is an 86 year old female with a past medical history significant for dementia, hypothyroidism, hypertension, and a recent left hip surgery on 08/22/2019.  She sustained a fall on 08/25/2019 at skilled nursing facility.  She was brought to the emergency department at [facility] where she was found to have a distal femur periprosthetic fracture.  She was subsequently transferred to [facility] for further management due to the complexity of injury.   New events last 24 hours / Subjective: Pt underwent ORIF and removal of hardware for her distal femur fracture in the afternoon of 08/26/19.  Hemoglobin 8.2 down from 9.5 that was drawn preoperatively.  Largely unremarkable surgery with plan for Lovenox for DVT prophylaxis and continued work with physical therapy and Occupational Therapy.  Patient reports feeling fine today.  She is pleasantly demented.  She has no complaints.  Assessment & Plan:   Principal Problem:   Closed fracture of left distal femur (HCC) Active Problems:   Dementia with behavioral disturbance (HCC)   Hypothyroidism   Normocytic anemia   Fall at home, initial encounter   Essential hypertension   1-s/p ORIF w hardware removal; appreciate orthopedic surgery PT/OT, weightbearing as tolerated Pain is well controlled, continue morphine 2 mg every 2 hours as needed for pain Norco 5-325 mg every 6 hours as needed Methocarbamol 500 mg every 6 hours as needed Zofran as needed Lovenox, Ancef per Ortho IVF's per ortho, will ck BMP for K W Nml K and blessing from PT/OT, should be good for d/c pending SNF bed availability.  2- Bed alarm 3- TSH WNL, cont levothyroxine 4- Slightly decreased after surg, cont to monitor 5- Cont Irbesartan  DVT prophylaxis: Lovenox Code Status: Full Family Communication: No family at bedside  Disposition Plan: SNF   Consultants:   Orthopedic Surgery   Antimicrobials:  Anti-infectives (From admission, onward)   Start     Dose/Rate Route Frequency Ordered Stop   08/26/19 1915  ceFAZolin (ANCEF) IVPB 2g/100 mL premix     2 g 200 mL/hr over 30 Minutes Intravenous Every 8 hours 08/26/19 1833 08/27/19 1914   08/26/19 1640  vancomycin (VANCOCIN) powder  Status:  Discontinued       As needed 08/26/19 1640 08/26/19 1725   08/26/19 1330  ceFAZolin (ANCEF) IVPB 2g/100 mL premix     2 g 200 mL/hr over 30 Minutes Intravenous To Short Stay 08/26/19 1321 08/27/19 1330       Objective: Vitals:   08/26/19 1932 08/27/19 0028 08/27/19 0449 08/27/19 0745  BP: (!) 179/72 (!) 153/67 134/85 (!) 148/54  Pulse: 92 93 93 96  Resp: 16 16 16    Temp: 99.1 F (37.3 C) 99.1 F (37.3 C) 99.9 F (37.7 C) 98.7 F (37.1 C)  TempSrc: Oral Oral Oral Oral  SpO2: 92% 100% 97% 95%  Weight:      Height:        Intake/Output Summary (Last 24 hours) at 08/27/2019 0854 Last data filed at 08/27/2019 0500 Gross per 24 hour  Intake 1918.5 ml  Output 350 ml  Net 1568.5 ml   Filed Weights   08/26/19 1455  Weight: 70.9 kg    Examination:  General exam: Appears calm and comfortable  Respiratory system: Clear to auscultation. Respiratory effort normal. No respiratory distress. No conversational dyspnea.  Cardiovascular system: S1 & S2 heard, RRR. No  murmurs. No pedal edema. Gastrointestinal system: Abdomen is nondistended, soft and nontender. Normal bowel sounds heard. Central nervous system: Alert. No focal neurological deficits. Speech clear.  Skin: No rashes, lesions or ulcers on exposed skin  Psychiatry: Judgement and insight are limited. Mood & affect appropriate.   Data Reviewed: I have personally reviewed following labs and imaging studies  CBC: Recent Labs  Lab 08/21/19 1941 08/22/19 0413 08/24/19 0407 08/25/19 1633 08/26/19 1404 08/27/19 0431  WBC 6.4 7.6 8.1 8.7 9.4 7.5   NEUTROABS 4.1  --   --   --   --   --   HGB 12.8 10.8* 9.1* 9.7* 9.5* 8.2*  HCT 38.7 32.5* 26.9* 29.3* 28.3* 25.1*  MCV 92.8 91.8 92.1 93.0 92.8 93.7  PLT 161 226 185 297 265 326   Basic Metabolic Panel: Recent Labs  Lab 08/21/19 2110 08/22/19 0413 08/24/19 0407 08/25/19 1633 08/26/19 1404  NA 131* 132* 130* 132*  --   K 4.3 4.4 4.2 4.2  --   CL 98 102 99 98  --   CO2 24 23 24 22   --   GLUCOSE 123* 138* 134* 125*  --   BUN 13 12 15 11   --   CREATININE 0.68 0.52 0.67 0.62 0.80  CALCIUM 8.9 8.3* 8.3* 8.7*  --    Recent Results (from the past 240 hour(s))  SARS CORONAVIRUS 2 (TAT 6-24 HRS) Nasopharyngeal Nasopharyngeal Swab     Status: None   Collection Time: 08/21/19  7:41 PM   Specimen: Nasopharyngeal Swab  Result Value Ref Range Status   SARS Coronavirus 2 NEGATIVE NEGATIVE Final    Comment: (NOTE) SARS-CoV-2 target nucleic acids are NOT DETECTED. The SARS-CoV-2 RNA is generally detectable in upper and lower respiratory specimens during the acute phase of infection. Negative results do not preclude SARS-CoV-2 infection, do not rule out co-infections with other pathogens, and should not be used as the sole basis for treatment or other patient management decisions. Negative results must be combined with clinical observations, patient history, and epidemiological information. The expected result is Negative. Fact Sheet for Patients: SugarRoll.be Fact Sheet for Healthcare Providers: https://www.woods-mathews.com/ This test is not yet approved or cleared by the [place] FDA and  has been authorized for detection and/or diagnosis of SARS-CoV-2 by FDA under an Emergency Use Authorization (EUA). This EUA will remain  in effect (meaning this test can be used) for the duration of the COVID-19 declaration under Section 56 4(b)(1) of the Act, 21 U.S.C. section 360bbb-3(b)(1), unless the authorization is terminated or revoked sooner.  Performed at [facility], [place] [address], [place], [place] [address]   Respiratory Panel by RT PCR (Flu A&B, Covid) - Nasopharyngeal Swab     Status: None   Collection Time: 08/25/19  6:52 PM   Specimen: Nasopharyngeal Swab  Result Value Ref Range Status   SARS Coronavirus 2 by RT PCR NEGATIVE NEGATIVE Final    Comment: (NOTE) SARS-CoV-2 target nucleic acids are NOT DETECTED. The SARS-CoV-2 RNA is generally detectable in upper respiratoy specimens during the acute phase of infection. The lowest concentration of SARS-CoV-2 viral copies this assay can detect is 131 copies/mL. A negative result does not preclude SARS-Cov-2 infection and should not be used as the sole basis for treatment or other patient management decisions. A negative result may occur with  improper specimen collection/handling, submission of specimen other than nasopharyngeal swab, presence of viral mutation(s) within the areas targeted by this assay, and inadequate number of  viral copies (<131 copies/mL). A negative result must be combined with clinical observations, patient history, and epidemiological information. The expected result is Negative. Fact Sheet for Patients:  https://www.moore.com/ Fact Sheet for Healthcare Providers:  https://www.young.biz/ This test is not yet ap proved or cleared by the [place] FDA and  has been authorized for detection and/or diagnosis of SARS-CoV-2 by FDA under an Emergency Use Authorization (EUA). This EUA will remain  in effect (meaning this test can be used) for the duration of the COVID-19 declaration under Section 564(b)(1) of the Act, 21 U.S.C. section 360bbb-3(b)(1), unless the authorization is terminated or revoked sooner.    Influenza A by PCR NEGATIVE NEGATIVE Final   Influenza B by PCR NEGATIVE NEGATIVE Final    Comment: (NOTE) The Xpert Xpress SARS-CoV-2/FLU/RSV assay is intended as an aid in  the diagnosis of  influenza from Nasopharyngeal swab specimens and  should not be used as a sole basis for treatment. Nasal washings and  aspirates are unacceptable for Xpert Xpress SARS-CoV-2/FLU/RSV  testing. Fact Sheet for Patients: https://www.moore.com/ Fact Sheet for Healthcare Providers: https://www.young.biz/ This test is not yet approved or cleared by the [place] FDA and  has been authorized for detection and/or diagnosis of SARS-CoV-2 by  FDA under an Emergency Use Authorization (EUA). This EUA will remain  in effect (meaning this test can be used) for the duration of the  Covid-19 declaration under Section 564(b)(1) of the Act, 21  U.S.C. section 360bbb-3(b)(1), unless the authorization is  terminated or revoked. Performed at [facility], [address] [place], [place] [address]   Surgical pcr screen     Status: Abnormal   Collection Time: 08/26/19  1:06 PM   Specimen: Nasal Mucosa; Nasal Swab  Result Value Ref Range Status   MRSA, PCR NEGATIVE NEGATIVE Final   Staphylococcus aureus POSITIVE (A) NEGATIVE Final    Comment: (NOTE) The Xpert SA Assay (FDA approved for NASAL specimens in patients 1 years of age and older), is one component of a comprehensive surveillance program. It is not intended to diagnose infection nor to guide or monitor treatment. Performed at [facility], [address] [place], [place] [address]       Radiology Studies: DG Chest 1 View  Result Date: 08/25/2019 CLINICAL DATA:  Left hip pain EXAM: CHEST  1 VIEW COMPARISON:  08/21/2019 FINDINGS: The heart size and mediastinal contours are within normal limits. No new focal airspace consolidation, pleural effusion, or pneumothorax. The visualized skeletal structures are unremarkable. IMPRESSION: No active disease. Electronically Signed   By: Duanne Guess D.O.   On: 08/25/2019 18:15   DG Knee Complete 4 Views Left  Result Date: 08/25/2019 CLINICAL  DATA:  Fall, left leg pain EXAM: LEFT FEMUR 2 VIEWS; LEFT KNEE - COMPLETE 4+ VIEW COMPARISON:  08/21/2019 FINDINGS: Interval ORIF of a intertrochanteric fracture of the proximal left femur with antegrade intramedullary nail, lag screw, and 2 distal interlocking screws. There is a new oblique perihardware fracture involving the distal left femoral metadiaphysis. There is approximately 1 cm of lateral and 1.5 cm of posterior displacement at the fracture site. The fracture line may extend intra-articularly to the patellofemoral compartment anteriorly. Alignment at the knee joint is maintained. Postsurgical changes within the soft tissues. Bones are demineralized. IMPRESSION: 1. New displaced obliquely oriented perihardware fracture of the distal left femoral metadiaphysis. 2. Interval ORIF of a intertrochanteric fracture of the proximal left femur. Electronically Signed   By: Duanne Guess D.O.  On: 08/25/2019 18:19   Left Knee  Result Date: 08/26/2019 CLINICAL DATA:  Status post fixation EXAM: PORTABLE LEFT KNEE - 1-2 VIEW COMPARISON:  None. FINDINGS: Medullary rod is noted within the left femur. A lateral fixation sideplate is noted with multiple fixation screws. The fracture fragments are in near anatomic alignment. IMPRESSION: Status post ORIF of left femoral fracture. Electronically Signed   By: Alcide Clever M.D.   On: 08/26/2019 19:31   DG C-Arm 1-60 Min  Result Date: 08/26/2019 CLINICAL DATA:  Distal left femoral fracture EXAM: LEFT FEMUR 2 VIEWS; DG C-ARM 1-60 MIN COMPARISON:  08/25/2019 FLUOROSCOPY TIME:  Fluoroscopy Time:  1 minutes 26 seconds Radiation Exposure Index (if provided by the fluoroscopic device): Not available Number of Acquired Spot Images: 6 FINDINGS: Previously placed medullary rod is again identified. Oblique fracture is noted through the distal left femur. Fixation sideplate with multiple fixation screws was subsequently placed within the femur. The fracture fragments are in near  anatomic alignment. IMPRESSION: Status post ORIF of distal left femoral fracture. Electronically Signed   By: Alcide Clever M.D.   On: 08/26/2019 18:13   DG FEMUR MIN 2 VIEWS LEFT  Result Date: 08/26/2019 CLINICAL DATA:  Distal left femoral fracture EXAM: LEFT FEMUR 2 VIEWS; DG C-ARM 1-60 MIN COMPARISON:  08/25/2019 FLUOROSCOPY TIME:  Fluoroscopy Time:  1 minutes 26 seconds Radiation Exposure Index (if provided by the fluoroscopic device): Not available Number of Acquired Spot Images: 6 FINDINGS: Previously placed medullary rod is again identified. Oblique fracture is noted through the distal left femur. Fixation sideplate with multiple fixation screws was subsequently placed within the femur. The fracture fragments are in near anatomic alignment. IMPRESSION: Status post ORIF of distal left femoral fracture. Electronically Signed   By: Alcide Clever M.D.   On: 08/26/2019 18:13   DG Femur Min 2 Views Left  Result Date: 08/25/2019 CLINICAL DATA:  Fall, left leg pain EXAM: LEFT FEMUR 2 VIEWS; LEFT KNEE - COMPLETE 4+ VIEW COMPARISON:  08/21/2019 FINDINGS: Interval ORIF of a intertrochanteric fracture of the proximal left femur with antegrade intramedullary nail, lag screw, and 2 distal interlocking screws. There is a new oblique perihardware fracture involving the distal left femoral metadiaphysis. There is approximately 1 cm of lateral and 1.5 cm of posterior displacement at the fracture site. The fracture line may extend intra-articularly to the patellofemoral compartment anteriorly. Alignment at the knee joint is maintained. Postsurgical changes within the soft tissues. Bones are demineralized. IMPRESSION: 1. New displaced obliquely oriented perihardware fracture of the distal left femoral metadiaphysis. 2. Interval ORIF of a intertrochanteric fracture of the proximal left femur. Electronically Signed   By: Duanne Guess D.O.   On: 08/25/2019 18:19      Scheduled Meds: . Chlorhexidine Gluconate Cloth  6  each Topical Daily  . docusate sodium  100 mg Oral BID  . enoxaparin (LOVENOX) injection  40 mg Subcutaneous Q24H  . gabapentin  100 mg Oral TID  . irbesartan  75 mg Oral Daily  . levothyroxine  137 mcg Oral QAC breakfast  . mirtazapine  15 mg Oral QHS  . mupirocin ointment  1 application Nasal BID  . senna  1 tablet Oral BID   Continuous Infusions: . 0.9 % NaCl with KCl 20 mEq / L 50 mL/hr at 08/26/19 1842  .  ceFAZolin (ANCEF) IV Stopped (08/26/19 1831)  .  ceFAZolin (ANCEF) IV Stopped (08/27/19 0459)  . methocarbamol (ROBAXIN) IV       LOS: 1  day   Time spent: 18 minutes   Jilda Rocheicholas Paul [place], DO [facility] 08/27/2019, 8:54 AM   Available via Epic secure chat 7am-7pm After these hours, please refer to coverage provider listed on [email or web address]

## 2019-08-27 NOTE — Evaluation (Signed)
Physical Therapy Evaluation Patient Details Name: Jaime [name] MRN: [id number] DOB: 08-31-1932 Today's Date: 08/27/2019   History of Present Illness  Patient is an 86 year old female with a past medical history significant for dementia, hypothyroidism, hypertension, and a recent left hip surgery on 08/22/2019.  She sustained a fall on 08/25/2019 at skilled nursing facility.  She was brought to the emergency department at [facility] where she was found to have a distal femur periprosthetic fracture.  She was subsequently transferred to [facility] for further management due to the complexity of injury.  New Pt underwent ORIF and removal of hardware for her distal femur fracture 08/26/19.  Clinical Impression  Pt in bed upon arrival of PT/OT, and was agreeable to evaluation despite significant pain. The pt demonstrates sig limitations in strength, mobility, and stability due to above dx and pain which limit her ability to move safely and independently as she was prior to initial admission. The pts course is further complicated by dementia, and therefore, I recommend she continue to receive skilled PT both acutely and in inpatient setting following d/c.   The pt was able to complete multiple stand-pivot transfers today with mod assist of 2 for physical assist and safety, but was unable to demo good wt shift to the L due to pain.     Follow Up Recommendations SNF;Supervision/Assistance - 24 hour    Equipment Recommendations  Other (comment)(I believe pt has all needed equipment from prior hospitalization)    Recommendations for Other Services       Precautions / Restrictions Precautions Precautions: Fall Restrictions Weight Bearing Restrictions: Yes LLE Weight Bearing: Weight bearing as tolerated      Mobility  Bed Mobility Overal bed mobility: Needs Assistance Bed Mobility: Sit to Supine     Supine to sit: Min assist;+2 for physical assistance     General bed  mobility comments: min A with with L LE to EOB, min guard A with elevating trunk. pt used rails with increased time and effort  Transfers Overall transfer level: Independent Equipment used: Rolling walker (2 wheeled) Transfers: Sit to/from [employer] to Stand: Mod assist;+2 physical assistance Stand pivot transfers: Mod assist;+2 physical assistance       General transfer comment: verbal cues for initiation and safety, pt very fearful and limited by pain  Ambulation/Gait Ambulation/Gait assistance: Min assist;Mod assist Gait Distance (Feet): 3 Feet(3 x 2 bed to [facility] then B[facility] to recliner) Assistive device: Rolling walker (2 wheeled) Gait Pattern/deviations: Step-to pattern;Shuffle;Decreased weight shift to left Gait velocity: decreased   General Gait Details: max vc's for technique and assist for walker management; antalgic; increased effort and time to perform minimal side steps to [facility] or recliner with minimal foot clearance of RLE  Stairs            Wheelchair Mobility    Modified Rankin (Stroke Patients Only)       Balance Overall balance assessment: Needs assistance Sitting-balance support: Feet supported Sitting balance-Leahy Scale: Fair Sitting balance - Comments: static sitting (pt leaning towards R side d/t L hip pain) Postural control: Right lateral lean Standing balance support: Bilateral upper extremity supported;During functional activity Standing balance-Leahy Scale: Poor Standing balance comment: pt requiring B UE support for static standing balance (pt tending to lean towards R side to offweight L LE)                             Pertinent  Vitals/Pain Pain Assessment: Faces Faces Pain Scale: Hurts even more Pain Location: L LE Pain Descriptors / Indicators: Grimacing;Guarding Pain Intervention(s): Limited activity within patient's tolerance;Monitored during session;Repositioned;Premedicated before session    Home  Living Family/patient expects to be discharged to:: Private residence Living Arrangements: Children Available Help at Discharge: Other (Comment)(caregivers 24/7 for pt and her son) Type of Home: House Home Access: Stairs to enter Entrance Stairs-Rails: Right;Left;Can reach both Entrance Stairs-Number of Steps: 3 Home Layout: Multi-level;1/2 bath on main level;Bed/bath upstairs   Additional Comments: PLOF and living arrangement info from previous chart, pt is a poor historian    Prior Function Level of Independence: Needs assistance   Gait / Transfers Assistance Needed: Independent with ambulation (no falls in past 6 months but did have fall in last year d/t syncope) (mod A at last hospital admission in early jan 2021)  ADL's / Homemaking Assistance Needed: uncertain how much caregivers were assisting with ADLs/selfcare  Comments: Caregivers 24/7 (7am-3pm; 3pm to 11 pm; 11pm to 7am) for pt and pt's son     Hand Dominance   Dominant Hand: Right    Extremity/Trunk Assessment   Upper Extremity Assessment Upper Extremity Assessment: Defer to OT evaluation    Lower Extremity Assessment Lower Extremity Assessment: Generalized weakness;LLE deficits/detail LLE Deficits / Details: sig deficits due to pain levels at eval, pt with difficulty with full ROM, bears weight minimally during trasnfer, not fully assess due to pain LLE: Unable to fully assess due to pain    Cervical / Trunk Assessment Cervical / Trunk Assessment: Normal  Communication   Communication: HOH  Cognition Arousal/Alertness: Awake/alert Behavior During Therapy: WFL for tasks assessed/performed;Anxious Overall Cognitive Status: No family/caregiver present to determine baseline cognitive functioning Area of Impairment: Orientation;Memory;Safety/judgement;Problem solving                 Orientation Level: Disoriented to;Time;Situation   Memory: Decreased short-term memory   Safety/Judgement: Decreased  awareness of deficits;Decreased awareness of safety   Problem Solving: Slow processing;Decreased initiation;Requires verbal cues;Requires tactile cues General Comments: hx of dementia/cognitive impairments, pt reports multiple times she is fearful of falling and of not being able to walk again      General Comments      Exercises     Assessment/Plan    PT Assessment Patient needs continued PT services  PT Problem List Decreased strength;Decreased activity tolerance;Decreased balance;Decreased mobility;Decreased knowledge of use of DME;Decreased safety awareness;Decreased knowledge of precautions;Pain;Decreased skin integrity;Decreased range of motion;Decreased coordination;Decreased cognition       PT Treatment Interventions DME instruction;Gait training;Stair training;Functional mobility training;Therapeutic activities;Therapeutic exercise;Balance training;Patient/family education    PT Goals (Current goals can be found in the Care Plan section)  Acute Rehab PT Goals Patient Stated Goal: to be able to walk again PT Goal Formulation: With patient Time For Goal Achievement: 09/06/19 Potential to Achieve Goals: Good    Frequency Min 2X/week   Barriers to discharge Decreased caregiver support pt with decreased caregiver support at home, recommend return to SNF if family agreeable    Co-evaluation PT/OT/SLP Co-Evaluation/Treatment: Yes Reason for Co-Treatment: For patient/therapist safety;To address functional/ADL transfers;Necessary to address cognition/behavior during functional activity PT goals addressed during session: Mobility/safety with mobility;Balance;Proper use of DME;Strengthening/ROM OT goals addressed during session: ADL's and self-care;Proper use of Adaptive equipment and DME       AM-PAC PT "6 Clicks" Mobility  Outcome Measure Help needed turning from your back to your side while in a flat bed without using bedrails?: A Little Help needed  moving from lying on  your back to sitting on the side of a flat bed without using bedrails?: A Little Help needed moving to and from a bed to a chair (including a wheelchair)?: A Lot Help needed standing up from a chair using your arms (e.g., wheelchair or bedside chair)?: A Lot Help needed to walk in hospital room?: A Lot Help needed climbing 3-5 steps with a railing? : Total 6 Click Score: 13    End of Session Equipment Utilized During Treatment: Gait belt Activity Tolerance: Patient limited by pain Patient left: with call bell/phone within reach;in chair;with chair alarm set Nurse Communication: Mobility status;Precautions;Weight bearing status PT Visit Diagnosis: Other abnormalities of gait and mobility (R26.89);Muscle weakness (generalized) (M62.81);History of falling (Z91.81);Difficulty in walking, not elsewhere classified (R26.2);Pain Pain - Right/Left: Left Pain - part of body: Hip;Knee    Time: 1914-7829 PT Time Calculation (min) (ACUTE ONLY): 33 min   Charges:   PT Evaluation $PT Eval Moderate Complexity: 1 Mod          Rolm Baptise, PT, DPT   Acute Rehabilitation Department [phone]  Gaetana Michaelis 08/27/2019, 12:35 PM

## 2019-08-27 NOTE — Evaluation (Signed)
Occupational Therapy Evaluation Patient Details Name: Jaime [name] MRN: [id number] DOB: 12-20-1932 Today's Date: 08/27/2019    History of Present Illness Patient is an 86 year old female with a past medical history significant for dementia, hypothyroidism, hypertension, and a recent left hip surgery on 08/22/2019.  She sustained a fall on 08/25/2019 at skilled nursing facility.  She was brought to the emergency department at [facility] where she was found to have a distal femur periprosthetic fracture.  She was subsequently transferred to [facility] for further management due to the complexity of injury.  New Pt underwent ORIF and removal of hardware for her distal femur fracture   Clinical Impression   Pt with decline in function and safety with ADLs and ADL mobility with impaired strength, balance and endurance. Pt with hx of dementia and cognitive impairments; pt pleasantly confused. Pt is a poor historian so uncertain of level of asssit she was receiving from the 24/7 caregivers at home as they are for her son as well per previous chart. PTA to previous admission L hip surgery 08/22/2019 from all at home, pt lived at home with her family and ws ambulatory. Pt currently requires min A +2 for bed mobility, min guard A with UB ADLs, max - total A with LB ADLs, total A with toileting and mod A +2 for SPTs. Pt would benefit from acute OT services to address impairments to maximize level of function and safety    Follow Up Recommendations  SNF    Equipment Recommendations  None recommended by OT;Other (comment)(TBD at SNF)    Recommendations for Other Services       Precautions / Restrictions Precautions Precautions: Fall Restrictions Weight Bearing Restrictions: No LLE Weight Bearing: Weight bearing as tolerated      Mobility Bed Mobility Overal bed mobility: Needs Assistance Bed Mobility: Sit to Supine     Supine to sit: Min assist;+2 for physical assistance      General bed mobility comments: min A with with L LE to EOB, min guard A with elevating trunk. pt used rails with increased time and effort  Transfers Overall transfer level: Independent   Transfers: Sit to/from [employer] Sit to Stand: Mod assist;+2 physical assistance Stand pivot transfers: Mod assist;+2 physical assistance       General transfer comment: verbal cues for initiation and safety, pt very fearful and limited by pain    Balance Overall balance assessment: Needs assistance Sitting-balance support: Feet supported Sitting balance-Leahy Scale: Fair     Standing balance support: Bilateral upper extremity supported;During functional activity Standing balance-Leahy Scale: Poor                             ADL either performed or assessed with clinical judgement   ADL Overall ADL's : Needs assistance/impaired Eating/Feeding: Set up;Independent;Sitting   Grooming: Therapist, nutritional;Wash/dry hands;Min guard;Sitting   Upper Body Bathing: Min guard;Sitting   Lower Body Bathing: Maximal assistance   Upper Body Dressing : Min guard;Sitting   Lower Body Dressing: Total assistance   Toilet Transfer: Moderate assistance;+2 for physical assistance;Stand-pivot;Cueing for safety;Cueing for sequencing;BSC   Toileting- Clothing Manipulation and Hygiene: Total assistance       Functional mobility during ADLs: Moderate assistance;+2 for physical assistance;Cueing for safety;Cueing for sequencing       Vision Baseline Vision/History: Wears glasses Wears Glasses: Reading only Patient Visual Report: No change from baseline       Perception  Praxis      Pertinent Vitals/Pain Pain Assessment: Faces Faces Pain Scale: Hurts even more Pain Location: L LE Pain Descriptors / Indicators: Grimacing;Guarding Pain Intervention(s): Limited activity within patient's tolerance;Monitored during session;Premedicated before session;Repositioned      Hand Dominance Right   Extremity/Trunk Assessment Upper Extremity Assessment Upper Extremity Assessment: Generalized weakness   Lower Extremity Assessment Lower Extremity Assessment: Defer to PT evaluation   Cervical / Trunk Assessment Cervical / Trunk Assessment: Normal   Communication Communication Communication: HOH   Cognition Arousal/Alertness: Awake/alert Behavior During Therapy: WFL for tasks assessed/performed;Anxious Overall Cognitive Status: No family/caregiver present to determine baseline cognitive functioning Area of Impairment: Orientation;Memory;Safety/judgement;Problem solving                 Orientation Level: Disoriented to;Time;Situation   Memory: Decreased short-term memory   Safety/Judgement: Decreased awareness of deficits;Decreased awareness of safety     General Comments: hx of dementia/cognitive impairments   General Comments       Exercises     Shoulder Instructions      Home Living Family/patient expects to be discharged to:: Private residence Living Arrangements: Children Available Help at Discharge: Other (Comment)(caregivers 24/7 for pt and her son) Type of Home: House Home Access: Stairs to enter [employer] of Steps: 3 Entrance Stairs-Rails: Right;Left;Can reach both Home Layout: Multi-level;1/2 bath on main level;Bed/bath upstairs Alternate Level Stairs-Number of Steps: flight plus landing plus another flight   Bathroom Shower/Tub: Occupational psychologist: Handicapped height         Additional Comments: PLOF info from previous chart, pt is a poor historian      Prior Functioning/Environment Level of Independence: Needs assistance  Gait / Transfers Assistance Needed: Independent with ambulation (no falls in past 6 months but did have fall in last year d/t syncope) ADL's / Homemaking Assistance Needed: uncertain how much caregivers were assisting with ADLs/selfcare   Comments: Caregivers  24/7 (7am-3pm; 3pm to 11 pm; 11pm to 7am) for pt and pt's son        OT Problem List: Decreased strength;Impaired balance (sitting and/or standing);Decreased cognition;Pain;Decreased safety awareness;Decreased activity tolerance;Decreased knowledge of use of DME or AE      OT Treatment/Interventions: Self-care/ADL training;DME and/or AE instruction;Therapeutic activities;Patient/family education    OT Goals(Current goals can be found in the care plan section) Acute Rehab OT Goals Patient Stated Goal: to be able to walk again OT Goal Formulation: With patient Time For Goal Achievement: 09/10/19 Potential to Achieve Goals: Good ADL Goals Pt Will Perform Grooming: with supervision;with set-up;sitting Pt Will Perform Upper Body Bathing: with supervision;with set-up;sitting Pt Will Perform Lower Body Bathing: with mod assist;sitting/lateral leans Pt Will Perform Upper Body Dressing: with supervision;with set-up;sitting Pt Will Transfer to Toilet: with mod assist;with min assist;stand pivot transfer;bedside commode Additional ADL Goal #1: Pt will complete bed mobility min A - min guard A to sit EOB for ADLs and functional tasks  OT Frequency: Min 2X/week   Barriers to D/C:            Co-evaluation PT/OT/SLP Co-Evaluation/Treatment: Yes Reason for Co-Treatment: Necessary to address cognition/behavior during functional activity;For patient/therapist safety;To address functional/ADL transfers   OT goals addressed during session: ADL's and self-care;Proper use of Adaptive equipment and DME      AM-PAC OT "6 Clicks" Daily Activity     Outcome Measure Help from another person eating meals?: None Help from another person taking care of personal grooming?: A Little Help from another person toileting, which includes  using toliet, bedpan, or urinal?: Total Help from another person bathing (including washing, rinsing, drying)?: A Lot Help from another person to put on and taking off regular  upper body clothing?: A Little Help from another person to put on and taking off regular lower body clothing?: Total 6 Click Score: 14   End of Session Equipment Utilized During Treatment: Gait belt;Other (comment)(BSC)  Activity Tolerance: Patient limited by pain Patient left: in chair;with call bell/phone within reach;with chair alarm set  OT Visit Diagnosis: Unsteadiness on feet (R26.81);Other abnormalities of gait and mobility (R26.89);Muscle weakness (generalized) (M62.81);History of falling (Z91.81);Other symptoms and signs involving cognitive function;Pain Pain - Right/Left: Left Pain - part of body: Hip;Leg                Time: 5910-2890 OT Time Calculation (min): 32 min Charges:  OT General Charges $OT Visit: 1 Visit OT Evaluation $OT Eval Moderate Complexity: 1 Mod    Galen Manila 08/27/2019, 11:47 AM

## 2019-08-27 NOTE — Progress Notes (Signed)
SPORTS MEDICINE AND JOINT REPLACEMENT  Georgena Spurling, MD    Laurier Nancy, PA-C [address] [place], [place], [place]  [address]                             [phone]   PROGRESS NOTE  Subjective:  negative for Chest Pain  negative for Shortness of Breath  negative for Nausea/Vomiting   negative for Calf Pain  negative for Bowel Movement   Tolerating Diet: yes         Patient reports pain as 4 on 0-10 scale.    Objective: Vital signs in last 24 hours:    Patient Vitals for the past 24 hrs:  BP Temp Temp src Pulse Resp SpO2 Height Weight  08/27/19 0745 (!) 148/54 98.7 F (37.1 C) Oral 96 -- 95 % -- --  08/27/19 0449 134/85 99.9 F (37.7 C) Oral 93 16 97 % -- --  08/27/19 0028 (!) 153/67 99.1 F (37.3 C) Oral 93 16 100 % -- --  08/26/19 1932 (!) 179/72 99.1 F (37.3 C) Oral 92 16 92 % -- --  08/26/19 1815 134/63 -- -- -- 14 -- -- --  08/26/19 1800 -- -- -- 84 14 100 % -- --  08/26/19 1759 119/63 -- -- -- (!) 21 -- -- --  08/26/19 1745 (!) 105/43 -- -- 72 16 99 % -- --  08/26/19 1730 (!) 95/34 97.8 F (36.6 C) -- -- 15 97 % -- --  08/26/19 1455 -- -- -- -- -- -- 5' 7.99" (1.727 m) 70.9 kg  08/26/19 1410 (!) 155/60 98.5 F (36.9 C) Oral 92 16 96 % -- --    @flow {1959:LAST@   Intake/Output from previous day:   01/08 0701 - 01/09 0700 In: 1918.5 [P.O.:440; I.V.:928.5] Out: 350 [Urine:300]   Intake/Output this shift:   No intake/output data recorded.   Intake/Output      01/08 0701 - 01/09 0700 01/09 0701 - 01/10 0700   P.O. 440    I.V. (mL/kg) 928.5 (13.1)    IV Piggyback 550    Total Intake(mL/kg) 1918.5 (27.1)    Urine (mL/kg/hr) 300    Stool 0    Blood 50    Total Output 350    Net +1568.5            LABORATORY DATA: Recent Labs    08/21/19 1941 08/22/19 0413 08/24/19 0407 08/25/19 1633 08/26/19 1404 08/27/19 0431  WBC 6.4 7.6 8.1 8.7 9.4 7.5  HGB 12.8 10.8* 9.1* 9.7* 9.5* 8.2*  HCT 38.7 32.5* 26.9* 29.3* 28.3* 25.1*  PLT 161 226 185 297  265 250   Recent Labs    08/21/19 2110 08/22/19 0413 08/24/19 0407 08/25/19 1633 08/26/19 1404  NA 131* 132* 130* 132*  --   K 4.3 4.4 4.2 4.2  --   CL 98 102 99 98  --   CO2 24 23 24 22   --   BUN 13 12 15 11   --   CREATININE 0.68 0.52 0.67 0.62 0.80  GLUCOSE 123* 138* 134* 125*  --   CALCIUM 8.9 8.3* 8.3* 8.7*  --    No results found for: INR, PROTIME  Examination:  General appearance: alert, cooperative and no distress Extremities: extremities normal, atraumatic, no cyanosis or edema  Wound Exam: clean, dry, intact   Drainage:  None: wound tissue dry  Motor Exam: Quadriceps and Hamstrings Intact  Sensory Exam: Superficial Peroneal,  Deep Peroneal and Tibial normal   Assessment:    1 Day Post-Op  Procedure(s) (LRB): OPEN REDUCTION INTERNAL FIXATION (ORIF) DISTAL FEMUR FRACTURE (Left)  ADDITIONAL DIAGNOSIS:  Principal Problem:   Closed fracture of left distal femur (HCC) Active Problems:   Dementia with behavioral disturbance (HCC)   Hypothyroidism   Normocytic anemia   Fall at home, initial encounter     Plan: Physical Therapy as ordered Weight Bearing as Tolerated (WBAT)  DVT Prophylaxis:  Lovenox  Patient resting comfortably in bed. PT/OT eval today. May be discharged back to SNF when cleared by medicine and when bed available.   Donia Ast 08/27/2019, 8:13 AM

## 2019-08-27 NOTE — Plan of Care (Signed)
  Problem: Education: Goal: Knowledge of General Education information will improve Description: Including pain rating scale, medication(s)/side effects and non-pharmacologic comfort measures Outcome: Progressing   Problem: Health Behavior/Discharge Planning: Goal: Ability to manage health-related needs will improve Outcome: Progressing   Problem: Clinical Measurements: Goal: Will remain free from infection Outcome: Progressing   Problem: Activity: Goal: Risk for activity intolerance will decrease Outcome: Progressing   Problem: Elimination: Goal: Will not experience complications related to bowel motility Outcome: Progressing   Problem: Pain Managment: Goal: General experience of comfort will improve Outcome: Progressing   Problem: Safety: Goal: Ability to remain free from injury will improve Outcome: Progressing   Problem: Skin Integrity: Goal: Risk for impaired skin integrity will decrease Outcome: Progressing   

## 2019-08-28 DIAGNOSIS — E871 Hypo-osmolality and hyponatremia: Secondary | ICD-10-CM

## 2019-08-28 DIAGNOSIS — I10 Essential (primary) hypertension: Secondary | ICD-10-CM

## 2019-08-28 LAB — CBC
HCT: 24 % — ABNORMAL LOW (ref 36.0–46.0)
Hemoglobin: 8.2 g/dL — ABNORMAL LOW (ref 12.0–15.0)
MCH: 31.5 pg (ref 26.0–34.0)
MCHC: 34.2 g/dL (ref 30.0–36.0)
MCV: 92.3 fL (ref 80.0–100.0)
Platelets: 265 10*3/uL (ref 150–400)
RBC: 2.6 MIL/uL — ABNORMAL LOW (ref 3.87–5.11)
RDW: 12.8 % (ref 11.5–15.5)
WBC: 8.3 10*3/uL (ref 4.0–10.5)
nRBC: 0 % (ref 0.0–0.2)

## 2019-08-28 LAB — BASIC METABOLIC PANEL
Anion gap: 10 (ref 5–15)
BUN: 13 mg/dL (ref 8–23)
CO2: 22 mmol/L (ref 22–32)
Calcium: 8.1 mg/dL — ABNORMAL LOW (ref 8.9–10.3)
Chloride: 98 mmol/L (ref 98–111)
Creatinine, Ser: 0.71 mg/dL (ref 0.44–1.00)
GFR calc Af Amer: >60 mL/min (ref >60)
GFR calc non Af Amer: >60 mL/min (ref >60)
Glucose, Bld: 111 mg/dL — ABNORMAL HIGH (ref 70–99)
Potassium: 4.1 mmol/L (ref 3.5–5.1)
Sodium: 130 mmol/L — ABNORMAL LOW (ref 135–145)

## 2019-08-28 MED ORDER — SODIUM CHLORIDE 0.9 % IV SOLN: INTRAVENOUS | Status: DC

## 2019-08-28 NOTE — Progress Notes (Signed)
Marland Kitchen  PROGRESS NOTE    Jaime [name]  PIR:[id number] DOB: [birth year] DOA: 08/26/2019 PCP: Mickey Farber, MD   Brief Narrative:   Patient is an 86 year old female with a past medical history significant for dementia, hypothyroidism, hypertension, and a recent left hip surgery on 08/22/2019.  She sustained a fall on 08/25/2019 at skilled nursing facility.  She was brought to the emergency department at [facility] where she was found to have a distal femur periprosthetic fracture.  She was subsequently transferred to [facility] for further management due to the complexity of injury. Pt underwent ORIF and removal of hardware for her distal femur fracture in the afternoon of 08/26/19.  Hemoglobin 8.2 down from 9.5 that was drawn preoperatively.  Largely unremarkable surgery with plan for Lovenox for DVT prophylaxis and continued work with physical therapy and Occupational Therapy.  08/28/19: No acute events ON.   Assessment & Plan:   Principal Problem:   Closed fracture of left distal femur (HCC) Active Problems:   Dementia with behavioral disturbance (HCC)   Hypothyroidism   Normocytic anemia   Fall at home, initial encounter   Essential hypertension  Closed Fx of left distal femur secondary to fall     - s/p ORIF w hardware removal; appreciate orthopedic surgery     - PT/OT, weightbearing as tolerated     - lovenox per ortho     - change LR to NS     - has not required PRN pain meds, but are available     - came from SNF? PT rec is for SNF  Hypothyroidism     - levothyroxine  Demential w/ behavoiral disturbance     - bed alarm  Hyponatremia     - change LR to NS; follow  Normocytic anemia     - Hgb is 8.2 this AM; no evidence of frank bleed, monitor for now  HTN     - irbesartan  DVT prophylaxis: lovenox Code Status: FULL Family Communication: None family at bedside   Disposition Plan: SNF  Consultants:   Orthopedics   ROS:  Denies CP, N, V, ab  pain . Remainder 10-pt ROS is negative for all not previously mentioned.  Subjective: "Oh... oh, ok. That'll be fine."  Objective: Vitals:   08/27/19 1506 08/27/19 2053 08/28/19 0350 08/28/19 0807  BP: (!) 147/51 (!) 136/53 (!) 147/58 (!) 163/62  Pulse: 94 88 95 93  Resp:  16 16   Temp: 99.8 F (37.7 C) 98.3 F (36.8 C) 98.2 F (36.8 C) 98.3 F (36.8 C)  TempSrc: Oral Oral Oral Oral  SpO2: (!) 65% 94% 97% 97%  Weight:      Height:        Intake/Output Summary (Last 24 hours) at 08/28/2019 1241 Last data filed at 08/28/2019 0300 Gross per 24 hour  Intake 1144.05 ml  Output 800 ml  Net 344.05 ml   Filed Weights   08/26/19 1455  Weight: 70.9 kg    Examination:  General: 86 y.o. female resting in bed in NAD Cardiovascular: RRR, +S1, S2, no m/g/r Respiratory: CTABL, no w/r/r, normal WOB GI: BS+, NDNT, no masses noted, no organomegaly noted MSK: No e/c/c, LLE bandage CDI Neuro: Alert to name follows commands Psyc: calm/cooperative   Data Reviewed: I have personally reviewed following labs and imaging studies.  CBC: Recent Labs  Lab 08/21/19 1941 08/24/19 0407 08/25/19 1633 08/26/19 1404 08/27/19 0431 08/28/19 0300  WBC 6.4 8.1 8.7 9.4 7.5 8.3  NEUTROABS 4.1  --   --   --   --   --   HGB 12.8 9.1* 9.7* 9.5* 8.2* 8.2*  HCT 38.7 26.9* 29.3* 28.3* 25.1* 24.0*  MCV 92.8 92.1 93.0 92.8 93.7 92.3  PLT 161 185 297 265 250 265   Basic Metabolic Panel: Recent Labs  Lab 08/22/19 0413 08/24/19 0407 08/25/19 1633 08/26/19 1404 08/27/19 1010 08/28/19 0300  NA 132* 130* 132*  --  132* 130*  K 4.4 4.2 4.2  --  4.0 4.1  CL 102 99 98  --  100 98  CO2 23 24 22   --  21* 22  GLUCOSE 138* 134* 125*  --  149* 111*  BUN 12 15 11   --  13 13  CREATININE 0.52 0.67 0.62 0.80 0.74 0.71  CALCIUM 8.3* 8.3* 8.7*  --  8.3* 8.1*   GFR: Estimated Creatinine Clearance: 52.8 mL/min (by C-G formula based on SCr of 0.71 mg/dL). Liver Function Tests: Recent Labs  Lab  08/21/19 2110 08/22/19 0413  AST 24 21  ALT 17 15  ALKPHOS 85 72  BILITOT 0.5 0.9  PROT 6.8 5.9*  ALBUMIN 3.7 3.3*   No results for input(s): LIPASE, AMYLASE in the last 168 hours. No results for input(s): AMMONIA in the last 168 hours. Coagulation Profile: No results for input(s): INR, PROTIME in the last 168 hours. Cardiac Enzymes: No results for input(s): CKTOTAL, CKMB, CKMBINDEX, TROPONINI in the last 168 hours. BNP (last 3 results) No results for input(s): PROBNP in the last 8760 hours. HbA1C: No results for input(s): HGBA1C in the last 72 hours. CBG: No results for input(s): GLUCAP in the last 168 hours. Lipid Profile: No results for input(s): CHOL, HDL, LDLCALC, TRIG, CHOLHDL, LDLDIRECT in the last 72 hours. Thyroid Function Tests: Recent Labs    08/26/19 1811  TSH 2.867   Anemia Panel: No results for input(s): VITAMINB12, FOLATE, FERRITIN, TIBC, IRON, RETICCTPCT in the last 72 hours. Sepsis Labs: No results for input(s): PROCALCITON, LATICACIDVEN in the last 168 hours.  Recent Results (from the past 240 hour(s))  SARS CORONAVIRUS 2 (TAT 6-24 HRS) Nasopharyngeal Nasopharyngeal Swab     Status: None   Collection Time: 08/21/19  7:41 PM   Specimen: Nasopharyngeal Swab  Result Value Ref Range Status   SARS Coronavirus 2 NEGATIVE NEGATIVE Final    Comment: (NOTE) SARS-CoV-2 target nucleic acids are NOT DETECTED. The SARS-CoV-2 RNA is generally detectable in upper and lower respiratory specimens during the acute phase of infection. Negative results do not preclude SARS-CoV-2 infection, do not rule out co-infections with other pathogens, and should not be used as the sole basis for treatment or other patient management decisions. Negative results must be combined with clinical observations, patient history, and epidemiological information. The expected result is Negative. Fact Sheet for Patients: 10/24/19 Fact Sheet for Healthcare  Providers: 10/19/19 This test is not yet approved or cleared by the [place] FDA and  has been authorized for detection and/or diagnosis of SARS-CoV-2 by FDA under an Emergency Use Authorization (EUA). This EUA will remain  in effect (meaning this test can be used) for the duration of the COVID-19 declaration under Section 56 4(b)(1) of the Act, 21 U.S.C. section 360bbb-3(b)(1), unless the authorization is terminated or revoked sooner. Performed at [facility], [address], [place], [address] [place]   Respiratory Panel by RT PCR (Flu A&B, Covid) - Nasopharyngeal Swab     Status: None   Collection Time: 08/25/19  6:52 PM   Specimen: Nasopharyngeal Swab  Result Value Ref Range Status   SARS Coronavirus 2 by RT PCR NEGATIVE NEGATIVE Final    Comment: (NOTE) SARS-CoV-2 target nucleic acids are NOT DETECTED. The SARS-CoV-2 RNA is generally detectable in upper respiratoy specimens during the acute phase of infection. The lowest concentration of SARS-CoV-2 viral copies this assay can detect is 131 copies/mL. A negative result does not preclude SARS-Cov-2 infection and should not be used as the sole basis for treatment or other patient management decisions. A negative result may occur with  improper specimen collection/handling, submission of specimen other than nasopharyngeal swab, presence of viral mutation(s) within the areas targeted by this assay, and inadequate number of viral copies (<131 copies/mL). A negative result must be combined with clinical observations, patient history, and epidemiological information. The expected result is Negative. Fact Sheet for Patients:  [employer] Fact Sheet for Healthcare Providers:  [employer]it This test is not yet ap proved or cleared by the [place] FDA and  has been authorized for detection and/or diagnosis of SARS-CoV-2 by FDA  under an Emergency Use Authorization (EUA). This EUA will remain  in effect (meaning this test can be used) for the duration of the COVID-19 declaration under Section 564(b)(1) of the Act, 21 U.S.C. section 360bbb-3(b)(1), unless the authorization is terminated or revoked sooner.    Influenza A by PCR NEGATIVE NEGATIVE Final   Influenza B by PCR NEGATIVE NEGATIVE Final    Comment: (NOTE) The Xpert Xpress SARS-CoV-2/FLU/RSV assay is intended as an aid in  the diagnosis of influenza from Nasopharyngeal swab specimens and  should not be used as a sole basis for treatment. Nasal washings and  aspirates are unacceptable for Xpert Xpress SARS-CoV-2/FLU/RSV  testing. Fact Sheet for Patients: PinkCheek.be Fact Sheet for Healthcare Providers: GravelBags.it This test is not yet approved or cleared by the [place] FDA and  has been authorized for detection and/or diagnosis of SARS-CoV-2 by  FDA under an Emergency Use Authorization (EUA). This EUA will remain  in effect (meaning this test can be used) for the duration of the  Covid-19 declaration under Section 564(b)(1) of the Act, 21  U.S.C. section 360bbb-3(b)(1), unless the authorization is  terminated or revoked. Performed at [facility], [address], [place], [place] [address]   Surgical pcr screen     Status: Abnormal   Collection Time: 08/26/19  1:06 PM   Specimen: Nasal Mucosa; Nasal Swab  Result Value Ref Range Status   MRSA, PCR NEGATIVE NEGATIVE Final   Staphylococcus aureus POSITIVE (A) NEGATIVE Final    Comment: (NOTE) The Xpert SA Assay (FDA approved for NASAL specimens in patients 40 years of age and older), is one component of a comprehensive surveillance program. It is not intended to diagnose infection nor to guide or monitor treatment. Performed at [facility], [address] [place], [place] [address]       Radiology Studies: Left  Knee  Result Date: 08/26/2019 CLINICAL DATA:  Status post fixation EXAM: PORTABLE LEFT KNEE - 1-2 VIEW COMPARISON:  None. FINDINGS: Medullary rod is noted within the left femur. A lateral fixation sideplate is noted with multiple fixation screws. The fracture fragments are in near anatomic alignment. IMPRESSION: Status post ORIF of left femoral fracture. Electronically Signed   By: Inez Catalina M.D.   On: 08/26/2019 19:31   DG C-Arm 1-60 Min  Result Date: 08/26/2019 CLINICAL DATA:  Distal left femoral fracture EXAM: LEFT FEMUR 2 VIEWS;  DG C-ARM 1-60 MIN COMPARISON:  08/25/2019 FLUOROSCOPY TIME:  Fluoroscopy Time:  1 minutes 26 seconds Radiation Exposure Index (if provided by the fluoroscopic device): Not available Number of Acquired Spot Images: 6 FINDINGS: Previously placed medullary rod is again identified. Oblique fracture is noted through the distal left femur. Fixation sideplate with multiple fixation screws was subsequently placed within the femur. The fracture fragments are in near anatomic alignment. IMPRESSION: Status post ORIF of distal left femoral fracture. Electronically Signed   By: Alcide Clever M.D.   On: 08/26/2019 18:13   DG FEMUR MIN 2 VIEWS LEFT  Result Date: 08/26/2019 CLINICAL DATA:  Distal left femoral fracture EXAM: LEFT FEMUR 2 VIEWS; DG C-ARM 1-60 MIN COMPARISON:  08/25/2019 FLUOROSCOPY TIME:  Fluoroscopy Time:  1 minutes 26 seconds Radiation Exposure Index (if provided by the fluoroscopic device): Not available Number of Acquired Spot Images: 6 FINDINGS: Previously placed medullary rod is again identified. Oblique fracture is noted through the distal left femur. Fixation sideplate with multiple fixation screws was subsequently placed within the femur. The fracture fragments are in near anatomic alignment. IMPRESSION: Status post ORIF of distal left femoral fracture. Electronically Signed   By: Alcide Clever M.D.   On: 08/26/2019 18:13     Scheduled Meds: . Chlorhexidine Gluconate  Cloth  6 each Topical Daily  . docusate sodium  100 mg Oral BID  . enoxaparin (LOVENOX) injection  40 mg Subcutaneous Q24H  . gabapentin  100 mg Oral TID  . irbesartan  75 mg Oral Daily  . levothyroxine  137 mcg Oral QAC breakfast  . mirtazapine  15 mg Oral QHS  . mupirocin ointment  1 application Nasal BID  . senna  1 tablet Oral BID   Continuous Infusions: . 0.9 % NaCl with KCl 20 mEq / L 50 mL/hr at 08/27/19 1515  . methocarbamol (ROBAXIN) IV       LOS: 2 days    Time spent: 25 minutes spent in the coordination of care today.    Teddy Spike, DO [facility]  If 7PM-7AM, please contact night-coverage [email or web address] 08/28/2019, 12:41 PM

## 2019-08-28 NOTE — Progress Notes (Signed)
SPORTS MEDICINE AND JOINT REPLACEMENT  Georgena Spurling, MD    Laurier Nancy, PA-C [address] [place], [place], [place]  [address]                             [phone]   PROGRESS NOTE  Subjective:  negative for Chest Pain  negative for Shortness of Breath  negative for Nausea/Vomiting   negative for Calf Pain  negative for Bowel Movement   Tolerating Diet: yes         Patient reports pain as 4 on 0-10 scale.    Objective: Vital signs in last 24 hours:    Patient Vitals for the past 24 hrs:  BP Temp Temp src Pulse Resp SpO2  08/28/19 0807 (!) 163/62 98.3 F (36.8 C) Oral 93 -- 97 %  08/28/19 0350 (!) 147/58 98.2 F (36.8 C) Oral 95 16 97 %  08/27/19 2053 (!) 136/53 98.3 F (36.8 C) Oral 88 16 94 %  08/27/19 1506 (!) 147/51 99.8 F (37.7 C) Oral 94 -- (!) 65 %    @flow {1959:LAST@   Intake/Output from previous day:   01/09 0701 - 01/10 0700 In: 1258.1 [P.O.:354; I.V.:904.1] Out: 800 [Urine:800]   Intake/Output this shift:   No intake/output data recorded.   Intake/Output      01/09 0701 - 01/10 0700 01/10 0701 - 01/11 0700   P.O. 354    I.V. (mL/kg) 904.1 (12.8)    IV Piggyback     Total Intake(mL/kg) 1258.1 (17.7)    Urine (mL/kg/hr) 800 (0.5)    Stool 0    Blood     Total Output 800    Net +458.1            LABORATORY DATA: Recent Labs    08/21/19 1941 08/22/19 0413 08/24/19 0407 08/25/19 1633 08/26/19 1404 08/27/19 0431 08/28/19 0300  WBC 6.4 7.6 8.1 8.7 9.4 7.5 8.3  HGB 12.8 10.8* 9.1* 9.7* 9.5* 8.2* 8.2*  HCT 38.7 32.5* 26.9* 29.3* 28.3* 25.1* 24.0*  PLT 161 226 185 297 265 250 265   Recent Labs    08/21/19 2110 08/22/19 0413 08/24/19 0407 08/25/19 1633 08/26/19 1404 08/27/19 1010 08/28/19 0300  NA 131* 132* 130* 132*  --  132* 130*  K 4.3 4.4 4.2 4.2  --  4.0 4.1  CL 98 102 99 98  --  100 98  CO2 24 23 24 22   --  21* 22  BUN 13 12 15 11   --  13 13  CREATININE 0.68 0.52 0.67 0.62 0.80 0.74 0.71  GLUCOSE 123* 138* 134* 125*  --   149* 111*  CALCIUM 8.9 8.3* 8.3* 8.7*  --  8.3* 8.1*   No results found for: INR, PROTIME  Examination:  General appearance: alert, cooperative and no distress Extremities: extremities normal, atraumatic, no cyanosis or edema  Wound Exam: clean, dry, intact   Drainage:  None: wound tissue dry  Motor Exam: Quadriceps and Hamstrings Intact  Sensory Exam: Superficial Peroneal, Deep Peroneal and Tibial normal   Assessment:    2 Days Post-Op  Procedure(s) (LRB): OPEN REDUCTION INTERNAL FIXATION (ORIF) DISTAL FEMUR FRACTURE (Left)  ADDITIONAL DIAGNOSIS:  Principal Problem:   Closed fracture of left distal femur (HCC) Active Problems:   Dementia with behavioral disturbance (HCC)   Hypothyroidism   Normocytic anemia   Fall at home, initial encounter   Essential hypertension     Plan: Physical Therapy as  ordered Weight Bearing as Tolerated (WBAT)  DVT Prophylaxis:  Lovenox  HGB remains at 8.2 today but patient doesn't appear symptomatic. Continue PT/OT. May be discharged back to SNF when cleared by medicine.  Donia Ast 08/28/2019, 8:23 AM

## 2019-08-28 NOTE — TOC Initial Note (Addendum)
Transition of Care [facility]) - Initial/Assessment Note    Patient Details  Name: Jaime [name] MRN: [id number] Date of Birth: 1933/07/13  [facility]) CM/SW Contact:    Bary Castilla, LCSW Phone Number: [phone] 08/28/2019, 2:16 PM  Clinical Narrative:                  CSW spoke with patient's daughter in law [name] due to patient's cognition and clarified if she wanted patient to return to [facility]. [name] stated that she did not want patient to return and would be agreeable to patient to going to another facility near [place].[name] prefers a facility that has a bed alarm if possible. CSW discussed the facilities in [place] and Andres gave CSW permission to fax out referral to 5 facilities. CSW informed [name] that once bed offers are received that she would receive a follow up phone call.  TOC team will continue to follow for discharge planning.   .Expected Discharge Plan: Skilled Nursing Facility Barriers to Discharge: Continued Medical Work up, SNF Pending bed offer   Patient Goals and CMS Choice        Expected Discharge Plan and Services Expected Discharge Plan: [place]                                              Prior Living Arrangements/Services   Lives with:: Adult Children Patient language and need for interpreter reviewed:: Yes Do you feel safe going back to the place where you live?: Yes        Care giver support system in place?: Yes (comment)      Activities of Daily Living Home Assistive Devices/Equipment: Dentures (specify type), Walker (specify type) ADL Screening (condition at time of admission) Patient's cognitive ability adequate to safely complete daily activities?: No Is the patient deaf or have difficulty hearing?: No Does the patient have difficulty seeing, even when wearing glasses/contacts?: No Does the patient have difficulty concentrating, remembering, or making  decisions?: No Patient able to express need for assistance with ADLs?: Yes Does the patient have difficulty dressing or bathing?: No Independently performs ADLs?: Yes (appropriate for developmental age) Does the patient have difficulty walking or climbing stairs?: No Weakness of Legs: None Weakness of Arms/Hands: None  Permission Sought/Granted      Share Information with NAME: Micey  Permission granted to share info w AGENCY: SNF  Permission granted to share info w Relationship: Daughter  Permission granted to share info w Contact Information: [phone]  Emotional Assessment   Attitude/Demeanor/Rapport: Unable to Assess Affect (typically observed): Unable to Assess Orientation: : Oriented to Self      Admission diagnosis:  Closed fracture of left distal femur [facility]) [S72.402A] Patient Active Problem List   Diagnosis Date Noted  . Essential hypertension 08/27/2019  . Closed fracture of left distal femur ([place]) 08/26/2019  . Normocytic anemia 08/26/2019  . Fall at home, initial encounter 08/26/2019  . Closed fracture of left distal femur ([place]) 08/24/2019  . Intertrochanteric fracture ([place]) 08/21/2019  . Hypothyroidism 08/21/2019  . Protein-calorie malnutrition, severe 04/20/2018  . Dementia with behavioral disturbance ([place]) 04/20/2018   PCP:  Ezequiel Kayser, MD Pharmacy:   [id number] [place], [place] [address] Phone: [phone] Fax: [phone]     Social  Determinants of Health (SDOH) Interventions    Readmission Risk Interventions Readmission Risk Prevention Plan 04/21/2018  Post Dischage Appt Complete  Medication Screening Complete  Transportation Screening Complete  PCP follow-up Complete  Some recent data might be hidden

## 2019-08-28 NOTE — Plan of Care (Signed)
  Problem: Education: Goal: Knowledge of General Education information will improve Description: Including pain rating scale, medication(s)/side effects and non-pharmacologic comfort measures Outcome: Progressing   Problem: Health Behavior/Discharge Planning: Goal: Ability to manage health-related needs will improve Outcome: Progressing   Problem: Clinical Measurements: Goal: Will remain free from infection Outcome: Progressing   Problem: Activity: Goal: Risk for activity intolerance will decrease Outcome: Progressing   Problem: Elimination: Goal: Will not experience complications related to bowel motility Outcome: Progressing   Problem: Pain Managment: Goal: General experience of comfort will improve Outcome: Progressing   Problem: Safety: Goal: Ability to remain free from injury will improve Outcome: Progressing   Problem: Skin Integrity: Goal: Risk for impaired skin integrity will decrease Outcome: Progressing   

## 2019-08-28 NOTE — NC FL2 (Signed)
[place] MEDICAID FL2 LEVEL OF CARE SCREENING TOOL     IDENTIFICATION  Patient Name: Jaime [name] Birthdate: [birth year] Sex: female Admission Date (Current Location): 08/26/2019  [facility] and [place] Number:  Producer, television/film/video and Address:  [facility] [place], [address], [place], [place] [address]      Provider Number: [id number]  Attending Physician Name and Address:  Teddy Spike, DO  Relative Name and Phone Number:  [name] [phone]    Current Level of Care: Hospital Recommended Level of Care: Skilled Nursing Facility Prior Approval Number:    Date Approved/Denied:   PASRR Number: [id number]  Discharge Plan: SNF    Current Diagnoses: Patient Active Problem List   Diagnosis Date Noted  . Essential hypertension 08/27/2019  . Closed fracture of left distal femur (HCC) 08/26/2019  . Normocytic anemia 08/26/2019  . Fall at home, initial encounter 08/26/2019  . Closed fracture of left distal femur (HCC) 08/24/2019  . Intertrochanteric fracture (HCC) 08/21/2019  . Hypothyroidism 08/21/2019  . Protein-calorie malnutrition, severe 04/20/2018  . Dementia with behavioral disturbance (HCC) 04/20/2018    Orientation RESPIRATION BLADDER Height & Weight     Self  Normal Continent, Indwelling catheter Weight: 156 lb 4.9 oz (70.9 kg) Height:  5' 7.99" (172.7 cm)  BEHAVIORAL SYMPTOMS/MOOD NEUROLOGICAL BOWEL NUTRITION STATUS      Continent (see discharge summary)  AMBULATORY STATUS COMMUNICATION OF NEEDS Skin   Extensive Assist Verbally Surgical wounds                       Personal Care Assistance Level of Assistance  Bathing, Feeding, Dressing Bathing Assistance: Limited assistance Feeding assistance: Independent Dressing Assistance: Limited assistance     Functional Limitations Info  Sight, Hearing, Speech Sight Info: Adequate Hearing Info: Adequate Speech Info: Adequate    SPECIAL CARE FACTORS FREQUENCY  PT (By  licensed PT), OT (By licensed OT)     PT Frequency: 5X per week OT Frequency: 5X per week            Contractures Contractures Info: Not present    Additional Factors Info  Code Status Code Status Info: Full             Current Medications (08/28/2019):  This is the current hospital active medication list Current Facility-Administered Medications  Medication Dose Route Frequency Provider Last Rate Last Admin  . 0.9 %  sodium chloride infusion   Intravenous Continuous Kyle, Tyrone A, DO      . acetaminophen (TYLENOL) tablet 325-650 mg  325-650 mg Oral Q6H PRN Despina Hidden, PA-C      . Chlorhexidine Gluconate Cloth 2 % PADS 6 each  6 each Topical Daily Despina Hidden, PA-C   6 each at 08/28/19 1042  . docusate sodium (COLACE) capsule 100 mg  100 mg Oral BID Ulyses Southward A, PA-C   100 mg at 08/28/19 1042  . enoxaparin (LOVENOX) injection 40 mg  40 mg Subcutaneous Q24H Ulyses Southward A, PA-C   40 mg at 08/28/19 1042  . gabapentin (NEURONTIN) capsule 100 mg  100 mg Oral TID Ulyses Southward A, PA-C   100 mg at 08/28/19 1042  . HYDROcodone-acetaminophen (NORCO/VICODIN) 5-325 MG per tablet 1 tablet  1 tablet Oral Q6H PRN Despina Hidden, PA-C      . irbesartan (AVAPRO) tablet 75 mg  75 mg Oral Daily Ulyses Southward A, PA-C   75 mg at 08/28/19 1043  .  levothyroxine (SYNTHROID) tablet 137 mcg  137 mcg Oral QAC breakfast Delray Alt, PA-C   137 mcg at 08/28/19 3646  . methocarbamol (ROBAXIN) tablet 500 mg  500 mg Oral Q6H PRN Patrecia Pace A, PA-C       Or  . methocarbamol (ROBAXIN) 500 mg in dextrose 5 % 50 mL IVPB  500 mg Intravenous Q6H PRN Delray Alt, PA-C      . metoCLOPramide (REGLAN) tablet 5-10 mg  5-10 mg Oral Q8H PRN Patrecia Pace A, PA-C       Or  . metoCLOPramide (REGLAN) injection 5-10 mg  5-10 mg Intravenous Q8H PRN Delray Alt, PA-C      . mirtazapine (REMERON) tablet 15 mg  15 mg Oral QHS Patrecia Pace A, PA-C   15 mg at 08/27/19 2234  . morphine 2 MG/ML  injection 2 mg  2 mg Intravenous Q2H PRN Delray Alt, PA-C      . mupirocin ointment (BACTROBAN) 2 % 1 application  1 application Nasal BID Delray Alt, PA-C   1 application at 80/32/12 1042  . ondansetron (ZOFRAN) tablet 4 mg  4 mg Oral Q6H PRN Delray Alt, PA-C       Or  . ondansetron [facility]) injection 4 mg  4 mg Intravenous Q6H PRN Delray Alt, PA-C      . senna (SENOKOT) tablet 8.6 mg  1 tablet Oral BID Patrecia Pace A, PA-C   8.6 mg at 08/28/19 1042     Discharge Medications: Please see discharge summary for a list of discharge medications.  Relevant Imaging Results:  Relevant Lab Results:   Additional Information SS# [place], [place]

## 2019-08-28 NOTE — Plan of Care (Signed)
  Problem: Pain Managment: Goal: General experience of comfort will improve Outcome: Progressing   Problem: Safety: Goal: Ability to remain free from injury will improve Outcome: Progressing   Problem: Skin Integrity: Goal: Risk for impaired skin integrity will decrease Outcome: Progressing   

## 2019-08-29 LAB — CBC WITH DIFFERENTIAL/PLATELET
Abs Immature Granulocytes: 0.07 10*3/uL (ref 0.00–0.07)
Basophils Absolute: 0 10*3/uL (ref 0.0–0.1)
Basophils Relative: 1 %
Eosinophils Absolute: 0.3 10*3/uL (ref 0.0–0.5)
Eosinophils Relative: 4 %
HCT: 24 % — ABNORMAL LOW (ref 36.0–46.0)
Hemoglobin: 7.8 g/dL — ABNORMAL LOW (ref 12.0–15.0)
Immature Granulocytes: 1 %
Lymphocytes Relative: 12 %
Lymphs Abs: 0.9 10*3/uL (ref 0.7–4.0)
MCH: 30.6 pg (ref 26.0–34.0)
MCHC: 32.5 g/dL (ref 30.0–36.0)
MCV: 94.1 fL (ref 80.0–100.0)
Monocytes Absolute: 1.2 10*3/uL — ABNORMAL HIGH (ref 0.1–1.0)
Monocytes Relative: 16 %
Neutro Abs: 5.1 10*3/uL (ref 1.7–7.7)
Neutrophils Relative %: 66 %
Platelets: 289 10*3/uL (ref 150–400)
RBC: 2.55 MIL/uL — ABNORMAL LOW (ref 3.87–5.11)
RDW: 12.8 % (ref 11.5–15.5)
WBC: 7.5 10*3/uL (ref 4.0–10.5)
nRBC: 0 % (ref 0.0–0.2)

## 2019-08-29 LAB — RENAL FUNCTION PANEL
Albumin: 2.1 g/dL — ABNORMAL LOW (ref 3.5–5.0)
Anion gap: 7 (ref 5–15)
BUN: 13 mg/dL (ref 8–23)
CO2: 22 mmol/L (ref 22–32)
Calcium: 7.9 mg/dL — ABNORMAL LOW (ref 8.9–10.3)
Chloride: 102 mmol/L (ref 98–111)
Creatinine, Ser: 0.72 mg/dL (ref 0.44–1.00)
GFR calc Af Amer: >60 mL/min (ref >60)
GFR calc non Af Amer: >60 mL/min (ref >60)
Glucose, Bld: 118 mg/dL — ABNORMAL HIGH (ref 70–99)
Phosphorus: 2.9 mg/dL (ref 2.5–4.6)
Potassium: 4.1 mmol/L (ref 3.5–5.1)
Sodium: 131 mmol/L — ABNORMAL LOW (ref 135–145)

## 2019-08-29 LAB — MAGNESIUM: Magnesium: 2 mg/dL (ref 1.7–2.4)

## 2019-08-29 LAB — IRON AND TIBC
Iron: 15 ug/dL — ABNORMAL LOW (ref 28–170)
Saturation Ratios: 8 % — ABNORMAL LOW (ref 10.4–31.8)
TIBC: 190 ug/dL — ABNORMAL LOW (ref 250–450)
UIBC: 175 ug/dL

## 2019-08-29 LAB — FERRITIN: Ferritin: 147 ng/mL (ref 11–307)

## 2019-08-29 MED ORDER — VITAMIN D 25 MCG (1000 UNIT) PO TABS
2000.0000 [IU] | ORAL_TABLET | Freq: Two times a day (BID) | ORAL | Status: DC
  Administered 2019-08-29 – 2019-09-02 (×9): 2000 [IU] via ORAL
  Filled 2019-08-29 (×9): qty 2

## 2019-08-29 NOTE — Progress Notes (Signed)
Orthopaedic Trauma Progress Note  S: Doing well this morning, pain in leg is fairly well controlled.  She is pleasantly demented.  No questions or concerns currently  O:  Vitals:   08/29/19 0313 08/29/19 0850  BP: (!) 146/59 (!) 136/58  Pulse: 94 96  Resp: 16 16  Temp: 98.1 F (36.7 C) 98.2 F (36.8 C)  SpO2: 98% 98%    General - Sitting up in bed, no acute distress.  Pleasantly demented Respiratory -  No increased work of breathing.  Left lower extremity -dressing removed, incisions are clean, dry, and intact.  Tender with palpation about the distal femur and knee which is to be expected.  Nontender in the lower leg.  Tolerates a very small amount of passive motion of the knee.  Compartments are soft and compressible.  Ankle dorsiflexion/ plantarflexion is intact.  Motor and sensory function is intact.  2+ DP pulse.  Imaging: Stable post op imaging.   Labs:  Results for orders placed or performed during the hospital encounter of 08/26/19 (from the past 24 hour(s))  CBC with Differential/Platelet     Status: Abnormal   Collection Time: 08/29/19  3:02 AM  Result Value Ref Range   WBC 7.5 4.0 - 10.5 K/uL   RBC 2.55 (L) 3.87 - 5.11 MIL/uL   Hemoglobin 7.8 (L) 12.0 - 15.0 g/dL   HCT 24.0 (L) 36.0 - 46.0 %   MCV 94.1 80.0 - 100.0 fL   MCH 30.6 26.0 - 34.0 pg   MCHC 32.5 30.0 - 36.0 g/dL   RDW 12.8 11.5 - 15.5 %   Platelets 289 150 - 400 K/uL   nRBC 0.0 0.0 - 0.2 %   Neutrophils Relative % 66 %   Neutro Abs 5.1 1.7 - 7.7 K/uL   Lymphocytes Relative 12 %   Lymphs Abs 0.9 0.7 - 4.0 K/uL   Monocytes Relative 16 %   Monocytes Absolute 1.2 (H) 0.1 - 1.0 K/uL   Eosinophils Relative 4 %   Eosinophils Absolute 0.3 0.0 - 0.5 K/uL   Basophils Relative 1 %   Basophils Absolute 0.0 0.0 - 0.1 K/uL   Immature Granulocytes 1 %   Abs Immature Granulocytes 0.07 0.00 - 0.07 K/uL  Magnesium     Status: None   Collection Time: 08/29/19  3:02 AM  Result Value Ref Range   Magnesium 2.0 1.7 -  2.4 mg/dL  Renal function panel     Status: Abnormal   Collection Time: 08/29/19  3:02 AM  Result Value Ref Range   Sodium 131 (L) 135 - 145 mmol/L   Potassium 4.1 3.5 - 5.1 mmol/L   Chloride 102 98 - 111 mmol/L   CO2 22 22 - 32 mmol/L   Glucose, Bld 118 (H) 70 - 99 mg/dL   BUN 13 8 - 23 mg/dL   Creatinine, Ser 0.72 0.44 - 1.00 mg/dL   Calcium 7.9 (L) 8.9 - 10.3 mg/dL   Phosphorus 2.9 2.5 - 4.6 mg/dL   Albumin 2.1 (L) 3.5 - 5.0 g/dL   GFR calc non Af Amer >60 >60 mL/min   GFR calc Af Amer >60 >60 mL/min   Anion gap 7 5 - 15    Assessment: 86 year old female status post fall  Injuries: Left periprosthetic distal femur fracture status post removal of distal screws and ORIF on 08/26/2019  Weightbearing: WBAT LLE  Insicional and dressing care: Okay for incisions to be left open to air  Showering: Okay to shower from Ortho  standpoint, Steri-Strips can get wet.  Orthopedic device(s): None   CV/Blood loss: Acute blood loss anemia, Hgb 7.8 this morning. Hemodynamically stable.  Continue to monitor CBC  Pain management:  1. Tylenol 325-650 mg q 6 hours PRN 2. Robaxin 500 mg q 6 hours PRN 3.  Hydrocodone 5-325 mg q 6 hours PRN 4. Neurontin 100 mg TID 5.  Morphine 2 mg q 2 hours PRN  VTE prophylaxis: Lovenox  ID: Ancef 2gm post op completed  Foley/Lines:  No foley, KVO IVFs  Medical co-morbidities: Dementia, hypertension, hypothyroidism  Impediments to Fracture Healing: Vitamin D level is on the low end of normal.  Will start patient on vitamin D3 supplementation.  This should be continued at discharge  Dispo: Up with therapies as tolerated.  Okay for discharge back to SNF from ortho standpoint once cleared by medicine team and therapies  Follow - up plan: 2 weeks for repeat x-rays  Contact information:  Truitt Merle MD, Ulyses Southward PA-C   Nabilah Davoli A. [facility] [phone] (office) [email or web address]

## 2019-08-29 NOTE — Progress Notes (Signed)
..  PROGRESS NOTE    Jaime [name]  DZH:[id number] DOB: [birth year] DOA: 08/26/2019 PCP: Ezequiel Kayser, MD   Brief Narrative:   Patient is an 86 year old female with a past medical history significant for dementia, hypothyroidism, hypertension, and a recent left hip surgery on 08/22/2019.  She sustained a fall on 08/25/2019 at skilled nursing facility.  She was brought to the emergency department at [facility] where she was found to have a distal femur periprosthetic fracture.  She was subsequently transferred to [facility] for further management due to the complexity of injury. Pt underwent ORIF and removal of hardware for her distal femur fracture in the afternoon of 08/26/19.  Hemoglobin 8.2 down from 9.5 that was drawn preoperatively.  Largely unremarkable surgery with plan for Lovenox for DVT prophylaxis and continued work with physical therapy and Occupational Therapy.  08/29/19: No acute events ON. Her Na+ is stable. Other labwork ok/stable. Working on SNF placement at this time.    Assessment & Plan:   Principal Problem:   Closed fracture of left distal femur (HCC) Active Problems:   Dementia with behavioral disturbance (HCC)   Hypothyroidism   Normocytic anemia   Fall at home, initial encounter   Essential hypertension  Closed Fx of left distal femur secondary to fall     - s/p ORIF w hardware removal; appreciate orthopedic surgery     - PT/OT, weightbearing as tolerated     - lovenox per ortho     - change LR to NS     - has not required PRN pain meds, but are available     - came from SNF? PT rec is for SNF     - does not want to return to previous SNF; CM is working on options  Hypothyroidism     - levothyroxine  Demential w/ behavoiral disturbance     - bed alarm  Hyponatremia     - change LR to NS; follow  Normocytic anemia     - Hgb is 7.8 this AM; get iron studies  HTN     - irbesartan  DVT prophylaxis: lovenox Code Status:  FULL Family Communication: None at bedside   Disposition Plan: SNF  Consultants:   Orthopedics  ROS:  Denies dyspnea, N, V, CP . Remainder 10-pt ROS is negative for all not previously mentioned.  Subjective: "I'm ok, sweetheart."  Objective: Vitals:   08/28/19 1402 08/28/19 1933 08/29/19 0313 08/29/19 0850  BP: (!) 134/55 (!) 132/50 (!) 146/59 (!) 136/58  Pulse: (!) 104 99 94 96  Resp:  16 16 16   Temp: 99.5 F (37.5 C) 99.6 F (37.6 C) 98.1 F (36.7 C) 98.2 F (36.8 C)  TempSrc: Oral Oral Oral Oral  SpO2: 96% 95% 98% 98%  Weight:      Height:        Intake/Output Summary (Last 24 hours) at 08/29/2019 0955 Last data filed at 08/29/2019 0900 Gross per 24 hour  Intake 634.85 ml  Output 1500 ml  Net -865.15 ml   Filed Weights   08/26/19 1455  Weight: 70.9 kg    Examination:  General: 86 y.o. female resting in bed in NAD Cardiovascular: RRR, +S1, S2, no m/g/r, equal pulses throughout Respiratory: CTABL, no w/r/r GI: BS+, NDNT, soft, no masses noted MSK: No e/c/c Neuro: alert and following commands Psyc: calm/cooperative   Data Reviewed: I have personally reviewed following labs and imaging studies.  CBC: Recent Labs  Lab 08/25/19 1633 08/26/19 1404  08/27/19 0431 08/28/19 0300 08/29/19 0302  WBC 8.7 9.4 7.5 8.3 7.5  NEUTROABS  --   --   --   --  5.1  HGB 9.7* 9.5* 8.2* 8.2* 7.8*  HCT 29.3* 28.3* 25.1* 24.0* 24.0*  MCV 93.0 92.8 93.7 92.3 94.1  PLT 297 265 250 265 289   Basic Metabolic Panel: Recent Labs  Lab 08/24/19 0407 08/25/19 1633 08/26/19 1404 08/27/19 1010 08/28/19 0300 08/29/19 0302  NA 130* 132*  --  132* 130* 131*  K 4.2 4.2  --  4.0 4.1 4.1  CL 99 98  --  100 98 102  CO2 24 22  --  21* 22 22  GLUCOSE 134* 125*  --  149* 111* 118*  BUN 15 11  --  13 13 13   CREATININE 0.67 0.62 0.80 0.74 0.71 0.72  CALCIUM 8.3* 8.7*  --  8.3* 8.1* 7.9*  MG  --   --   --   --   --  2.0  PHOS  --   --   --   --   --  2.9   GFR: Estimated  Creatinine Clearance: 52.8 mL/min (by C-G formula based on SCr of 0.72 mg/dL). Liver Function Tests: Recent Labs  Lab 08/29/19 0302  ALBUMIN 2.1*   No results for input(s): LIPASE, AMYLASE in the last 168 hours. No results for input(s): AMMONIA in the last 168 hours. Coagulation Profile: No results for input(s): INR, PROTIME in the last 168 hours. Cardiac Enzymes: No results for input(s): CKTOTAL, CKMB, CKMBINDEX, TROPONINI in the last 168 hours. BNP (last 3 results) No results for input(s): PROBNP in the last 8760 hours. HbA1C: No results for input(s): HGBA1C in the last 72 hours. CBG: No results for input(s): GLUCAP in the last 168 hours. Lipid Profile: No results for input(s): CHOL, HDL, LDLCALC, TRIG, CHOLHDL, LDLDIRECT in the last 72 hours. Thyroid Function Tests: Recent Labs    08/26/19 1811  TSH 2.867   Anemia Panel: No results for input(s): VITAMINB12, FOLATE, FERRITIN, TIBC, IRON, RETICCTPCT in the last 72 hours. Sepsis Labs: No results for input(s): PROCALCITON, LATICACIDVEN in the last 168 hours.  Recent Results (from the past 240 hour(s))  SARS CORONAVIRUS 2 (TAT 6-24 HRS) Nasopharyngeal Nasopharyngeal Swab     Status: None   Collection Time: 08/21/19  7:41 PM   Specimen: Nasopharyngeal Swab  Result Value Ref Range Status   SARS Coronavirus 2 NEGATIVE NEGATIVE Final    Comment: (NOTE) SARS-CoV-2 target nucleic acids are NOT DETECTED. The SARS-CoV-2 RNA is generally detectable in upper and lower respiratory specimens during the acute phase of infection. Negative results do not preclude SARS-CoV-2 infection, do not rule out co-infections with other pathogens, and should not be used as the sole basis for treatment or other patient management decisions. Negative results must be combined with clinical observations, patient history, and epidemiological information. The expected result is Negative. Fact Sheet for  Patients: 10/19/19 Fact Sheet for Healthcare Providers: HairSlick.no This test is not yet approved or cleared by the [employer]ir.[employer] FDA and  has been authorized for detection and/or diagnosis of SARS-CoV-2 by FDA under an Emergency Use Authorization (EUA). This EUA will remain  in effect (meaning this test can be used) for the duration of the COVID-19 declaration under Section 56 4(b)(1) of the Act, 21 U.S.C. section 360bbb-3(b)(1), unless the authorization is terminated or revoked sooner. Performed at [facility], [address] [place], [place]   Respiratory Panel  by RT PCR (Flu A&B, Covid) - Nasopharyngeal Swab     Status: None   Collection Time: 08/25/19  6:52 PM   Specimen: Nasopharyngeal Swab  Result Value Ref Range Status   SARS Coronavirus 2 by RT PCR NEGATIVE NEGATIVE Final    Comment: (NOTE) SARS-CoV-2 target nucleic acids are NOT DETECTED. The SARS-CoV-2 RNA is generally detectable in upper respiratoy specimens during the acute phase of infection. The lowest concentration of SARS-CoV-2 viral copies this assay can detect is 131 copies/mL. A negative result does not preclude SARS-Cov-2 infection and should not be used as the sole basis for treatment or other patient management decisions. A negative result may occur with  improper specimen collection/handling, submission of specimen other than nasopharyngeal swab, presence of viral mutation(s) within the areas targeted by this assay, and inadequate number of viral copies (<131 copies/mL). A negative result must be combined with clinical observations, patient history, and epidemiological information. The expected result is Negative. Fact Sheet for Patients:  https://www.moore.com/ Fact Sheet for Healthcare Providers:  https://www.young.biz/ This test is not yet ap proved or cleared by the [place] FDA  and  has been authorized for detection and/or diagnosis of SARS-CoV-2 by FDA under an Emergency Use Authorization (EUA). This EUA will remain  in effect (meaning this test can be used) for the duration of the COVID-19 declaration under Section 564(b)(1) of the Act, 21 U.S.C. section 360bbb-3(b)(1), unless the authorization is terminated or revoked sooner.    Influenza A by PCR NEGATIVE NEGATIVE Final   Influenza B by PCR NEGATIVE NEGATIVE Final    Comment: (NOTE) The Xpert Xpress SARS-CoV-2/FLU/RSV assay is intended as an aid in  the diagnosis of influenza from Nasopharyngeal swab specimens and  should not be used as a sole basis for treatment. Nasal washings and  aspirates are unacceptable for Xpert Xpress SARS-CoV-2/FLU/RSV  testing. Fact Sheet for Patients: https://www.moore.com/ Fact Sheet for Healthcare Providers: https://www.young.biz/ This test is not yet approved or cleared by the [place] FDA and  has been authorized for detection and/or diagnosis of SARS-CoV-2 by  FDA under an Emergency Use Authorization (EUA). This EUA will remain  in effect (meaning this test can be used) for the duration of the  Covid-19 declaration under Section 564(b)(1) of the Act, 21  U.S.C. section 360bbb-3(b)(1), unless the authorization is  terminated or revoked. Performed at [facility], [address], [place], [place] [address]   Surgical pcr screen     Status: Abnormal   Collection Time: 08/26/19  1:06 PM   Specimen: Nasal Mucosa; Nasal Swab  Result Value Ref Range Status   MRSA, PCR NEGATIVE NEGATIVE Final   Staphylococcus aureus POSITIVE (A) NEGATIVE Final    Comment: (NOTE) The Xpert SA Assay (FDA approved for NASAL specimens in patients 17 years of age and older), is one component of a comprehensive surveillance program. It is not intended to diagnose infection nor to guide or monitor treatment. Performed at [facility], [address] [place], [place] [address]       Radiology Studies: No results found.   Scheduled Meds: . Chlorhexidine Gluconate Cloth  6 each Topical Daily  . docusate sodium  100 mg Oral BID  . enoxaparin (LOVENOX) injection  40 mg Subcutaneous Q24H  . gabapentin  100 mg Oral TID  . irbesartan  75 mg Oral Daily  . levothyroxine  137 mcg Oral QAC breakfast  . mirtazapine  15 mg Oral QHS  . mupirocin ointment  1 application Nasal BID  . senna  1 tablet Oral BID   Continuous Infusions: . sodium chloride 50 mL/hr at 08/28/19 1741  . methocarbamol (ROBAXIN) IV       LOS: 3 days    Time spent: 25 minutes spent in the coordination of care today.   Teddy Spike, DO [facility]  If 7PM-7AM, please contact night-coverage [email or web address] 08/29/2019, 9:55 AM

## 2019-08-29 NOTE — Plan of Care (Signed)
  Problem: Pain Managment: Goal: General experience of comfort will improve Outcome: Progressing   Problem: Safety: Goal: Ability to remain free from injury will improve Outcome: Progressing   Problem: Skin Integrity: Goal: Risk for impaired skin integrity will decrease Outcome: Progressing   

## 2019-08-29 NOTE — Plan of Care (Signed)
  Problem: Health Behavior/Discharge Planning: Goal: Ability to manage health-related needs will improve Outcome: Not Progressing   Problem: Clinical Measurements: Goal: Ability to maintain clinical measurements within normal limits will improve Outcome: Not Progressing Goal: Will remain free from infection Outcome: Not Progressing   

## 2019-08-30 ENCOUNTER — Encounter: Payer: Self-pay | Admitting: *Deleted

## 2019-08-30 MED ORDER — SODIUM CHLORIDE 0.9 % IV SOLN
125.0000 mg | Freq: Once | INTRAVENOUS | Status: AC
  Administered 2019-08-30: 18:00:00 125 mg via INTRAVENOUS
  Filled 2019-08-30: qty 10

## 2019-08-30 NOTE — Progress Notes (Signed)
Marland Kitchen  PROGRESS NOTE    Jaime [name]  IZT:[id number] DOB: Oct 18, 1932 DOA: 08/26/2019 PCP: Mickey Farber, MD   Brief Narrative:   Patient is an 86 year old female with a past medical history significant for dementia, hypothyroidism, hypertension, and a recent left hip surgery on 08/22/2019. She sustained a fall on 08/25/2019 at skilled nursing facility. She was brought to the emergency department at [facility] where she was found to have a distal femur periprosthetic fracture. She was subsequently transferred to [facility] for further management due to the complexity of injury. Pt underwent ORIF and removal of hardware for her distal femur fracture in the afternoon of 08/26/19. Hemoglobin 8.2 down from 9.5 that was drawn preoperatively. Largely unremarkable surgery with plan for Lovenox for DVT prophylaxis and continued work with physical therapy and Occupational Therapy.  08/29/19: Working on SNF placement at this time. Fe is low. Replace. She is otherwise stable. Needs placement.     Assessment & Plan:   Principal Problem:   Closed fracture of left distal femur (HCC) Active Problems:   Dementia with behavioral disturbance (HCC)   Hypothyroidism   Normocytic anemia   Fall at home, initial encounter   Essential hypertension  Closed Fx of left distal femur secondary to fall - s/p ORIF w hardware removal; appreciate orthopedic surgery - PT/OT, weightbearing as tolerated - lovenox per ortho - change LR to NS - has not required PRN pain meds, but are available - came from SNF? PT rec is for SNF     - does not want to return to previous SNF; CM waiting for callback from new SNF  Hypothyroidism - levothyroxine  Demential w/ behavoiral disturbance - bed alarm  Hyponatremia - change LR to NS; follow  Normocytic anemia - Hgb is 7.8 this AM; get iron studies     - iron low; get nulecit today  HTN -  irbesartan  DVT prophylaxis: lovenox Code Status: FULL Family Communication: None at bedside   Disposition Plan: To SNF when bed secured.  Consultants:   Orthopedics  ROS:  Denies ab pain, N, V, dyspena, CP . Remainder 10-pt ROS is negative for all not previously mentioned.  Subjective: "I need to go to the bathroom."  Objective: Vitals:   08/29/19 1502 08/29/19 1934 08/30/19 0250 08/30/19 0724  BP: 136/60 (!) 141/48 (!) 140/54 (!) 124/47  Pulse: 94 91 91 89  Resp: 16   18  Temp: 98.2 F (36.8 C) 99.2 F (37.3 C) 98.6 F (37 C) 98.8 F (37.1 C)  TempSrc: Oral Oral Oral Oral  SpO2: 98% 96% 96% 94%  Weight:      Height:        Intake/Output Summary (Last 24 hours) at 08/30/2019 1415 Last data filed at 08/30/2019 1304 Gross per 24 hour  Intake 720 ml  Output 1200 ml  Net -480 ml   Filed Weights   08/26/19 1455  Weight: 70.9 kg    Examination:  General: 86 y.o. female resting in bed in NAD Cardiovascular: RRR, +S1, S2, no m/g/r Respiratory: CTABL, no w/r/r, normal WOB GI: BS+, NDNT, soft, no masses noted MSK: No e/c/c Neuro: alert to name, follows commands Psyc: calm/cooperative   Data Reviewed: I have personally reviewed following labs and imaging studies.  CBC: Recent Labs  Lab 08/25/19 1633 08/26/19 1404 08/27/19 0431 08/28/19 0300 08/29/19 0302  WBC 8.7 9.4 7.5 8.3 7.5  NEUTROABS  --   --   --   --  5.1  HGB 9.7* 9.5* 8.2* 8.2* 7.8*  HCT 29.3* 28.3* 25.1* 24.0* 24.0*  MCV 93.0 92.8 93.7 92.3 94.1  PLT 297 265 250 265 289   Basic Metabolic Panel: Recent Labs  Lab 08/24/19 0407 08/25/19 1633 08/26/19 1404 08/27/19 1010 08/28/19 0300 08/29/19 0302  NA 130* 132*  --  132* 130* 131*  K 4.2 4.2  --  4.0 4.1 4.1  CL 99 98  --  100 98 102  CO2 24 22  --  21* 22 22  GLUCOSE 134* 125*  --  149* 111* 118*  BUN 15 11  --  13 13 13   CREATININE 0.67 0.62 0.80 0.74 0.71 0.72  CALCIUM 8.3* 8.7*  --  8.3* 8.1* 7.9*  MG  --   --   --   --   --   2.0  PHOS  --   --   --   --   --  2.9   GFR: Estimated Creatinine Clearance: 52.8 mL/min (by C-G formula based on SCr of 0.72 mg/dL). Liver Function Tests: Recent Labs  Lab 08/29/19 0302  ALBUMIN 2.1*   No results for input(s): LIPASE, AMYLASE in the last 168 hours. No results for input(s): AMMONIA in the last 168 hours. Coagulation Profile: No results for input(s): INR, PROTIME in the last 168 hours. Cardiac Enzymes: No results for input(s): CKTOTAL, CKMB, CKMBINDEX, TROPONINI in the last 168 hours. BNP (last 3 results) No results for input(s): PROBNP in the last 8760 hours. HbA1C: No results for input(s): HGBA1C in the last 72 hours. CBG: No results for input(s): GLUCAP in the last 168 hours. Lipid Profile: No results for input(s): CHOL, HDL, LDLCALC, TRIG, CHOLHDL, LDLDIRECT in the last 72 hours. Thyroid Function Tests: No results for input(s): TSH, T4TOTAL, FREET4, T3FREE, THYROIDAB in the last 72 hours. Anemia Panel: Recent Labs    08/29/19 1216  FERRITIN 147  TIBC 190*  IRON 15*   Sepsis Labs: No results for input(s): PROCALCITON, LATICACIDVEN in the last 168 hours.  Recent Results (from the past 240 hour(s))  SARS CORONAVIRUS 2 (TAT 6-24 HRS) Nasopharyngeal Nasopharyngeal Swab     Status: None   Collection Time: 08/21/19  7:41 PM   Specimen: Nasopharyngeal Swab  Result Value Ref Range Status   SARS Coronavirus 2 NEGATIVE NEGATIVE Final    Comment: (NOTE) SARS-CoV-2 target nucleic acids are NOT DETECTED. The SARS-CoV-2 RNA is generally detectable in upper and lower respiratory specimens during the acute phase of infection. Negative results do not preclude SARS-CoV-2 infection, do not rule out co-infections with other pathogens, and should not be used as the sole basis for treatment or other patient management decisions. Negative results must be combined with clinical observations, patient history, and epidemiological information. The expected result is  Negative. Fact Sheet for Patients: 10/19/19 Fact Sheet for Healthcare Providers: HairSlick.no This test is not yet approved or cleared by the quierodirigir.com FDA and  has been authorized for detection and/or diagnosis of SARS-CoV-2 by FDA under an Emergency Use Authorization (EUA). This EUA will remain  in effect (meaning this test can be used) for the duration of the COVID-19 declaration under Section 56 4(b)(1) of the Act, 21 U.S.C. section 360bbb-3(b)(1), unless the authorization is terminated or revoked sooner. Performed at [facility], [address], [place], [place]   Respiratory Panel by RT PCR (Flu A&B, Covid) - Nasopharyngeal Swab     Status: None   Collection Time: 08/25/19  6:52 PM  Specimen: Nasopharyngeal Swab  Result Value Ref Range Status   SARS Coronavirus 2 by RT PCR NEGATIVE NEGATIVE Final    Comment: (NOTE) SARS-CoV-2 target nucleic acids are NOT DETECTED. The SARS-CoV-2 RNA is generally detectable in upper respiratoy specimens during the acute phase of infection. The lowest concentration of SARS-CoV-2 viral copies this assay can detect is 131 copies/mL. A negative result does not preclude SARS-Cov-2 infection and should not be used as the sole basis for treatment or other patient management decisions. A negative result may occur with  improper specimen collection/handling, submission of specimen other than nasopharyngeal swab, presence of viral mutation(s) within the areas targeted by this assay, and inadequate number of viral copies (<131 copies/mL). A negative result must be combined with clinical observations, patient history, and epidemiological information. The expected result is Negative. Fact Sheet for Patients:  PinkCheek.be Fact Sheet for Healthcare Providers:  GravelBags.it This test is not yet ap proved or cleared  by the [place] FDA and  has been authorized for detection and/or diagnosis of SARS-CoV-2 by FDA under an Emergency Use Authorization (EUA). This EUA will remain  in effect (meaning this test can be used) for the duration of the COVID-19 declaration under Section 564(b)(1) of the Act, 21 U.S.C. section 360bbb-3(b)(1), unless the authorization is terminated or revoked sooner.    Influenza A by PCR NEGATIVE NEGATIVE Final   Influenza B by PCR NEGATIVE NEGATIVE Final    Comment: (NOTE) The Xpert Xpress SARS-CoV-2/FLU/RSV assay is intended as an aid in  the diagnosis of influenza from Nasopharyngeal swab specimens and  should not be used as a sole basis for treatment. Nasal washings and  aspirates are unacceptable for Xpert Xpress SARS-CoV-2/FLU/RSV  testing. Fact Sheet for Patients: PinkCheek.be Fact Sheet for Healthcare Providers: GravelBags.it This test is not yet approved or cleared by the [place] FDA and  has been authorized for detection and/or diagnosis of SARS-CoV-2 by  FDA under an Emergency Use Authorization (EUA). This EUA will remain  in effect (meaning this test can be used) for the duration of the  Covid-19 declaration under Section 564(b)(1) of the Act, 21  U.S.C. section 360bbb-3(b)(1), unless the authorization is  terminated or revoked. Performed at [facility], [address], [place], [place] [address]   Surgical pcr screen     Status: Abnormal   Collection Time: 08/26/19  1:06 PM   Specimen: Nasal Mucosa; Nasal Swab  Result Value Ref Range Status   MRSA, PCR NEGATIVE NEGATIVE Final   Staphylococcus aureus POSITIVE (A) NEGATIVE Final    Comment: (NOTE) The Xpert SA Assay (FDA approved for NASAL specimens in patients 35 years of age and older), is one component of a comprehensive surveillance program. It is not intended to diagnose infection nor to guide or monitor  treatment. Performed at [facility], [place] [address], [place], [place] [address]       Radiology Studies: No results found.   Scheduled Meds: . cholecalciferol  2,000 Units Oral BID  . docusate sodium  100 mg Oral BID  . enoxaparin (LOVENOX) injection  40 mg Subcutaneous Q24H  . gabapentin  100 mg Oral TID  . irbesartan  75 mg Oral Daily  . levothyroxine  137 mcg Oral QAC breakfast  . mirtazapine  15 mg Oral QHS  . mupirocin ointment  1 application Nasal BID  . senna  1 tablet Oral BID   Continuous Infusions: . sodium chloride 50 mL/hr at 08/28/19 1741  .  methocarbamol (ROBAXIN) IV       LOS: 4 days    Time spent: 25 minutes spent in the coordination of care today.    Teddy Spike, DO [facility]  If 7PM-7AM, please contact night-coverage [email or web address] 08/30/2019, 2:15 PM

## 2019-08-30 NOTE — TOC Progression Note (Addendum)
Transition of Care [facility]) - Progression Note    Patient Details  Name: Jaime [name] MRN: [id number] Date of Birth: March 14, 1933  [facility] CM/SW Contact  Epifanio Lesches, RN Phone Number: 08/30/2019, 11:51 AM  Clinical Narrative:     Pt without SNF bed offers. NCM called [facility] , [place]  ( family's preference) asking them to review. Admission liaison not available. NCM contact info left for a return call  from their admission's liasion , Caryl Asp or Rickie with Diplomatic Services operational officer.  TOC team will continue to monitor and follow ....   08/30/2019 NCM received call from [employer]. Rickie informed NCM they have no SNF beds. NCM to make pt/family aware and continue to monitor for SNF bed offers. Bed search expanded to [place] SNF's.  Expected Discharge Plan: Skilled Nursing Facility Barriers to Discharge: No SNF bed(Pt without SNF bed offers)  Expected Discharge Plan and Services Expected Discharge Plan: Skilled Nursing Facility                                               Social Determinants of Health (SDOH) Interventions    Readmission Risk Interventions Readmission Risk Prevention Plan 04/21/2018  Post Dischage Appt Complete  Medication Screening Complete  Transportation Screening Complete  PCP follow-up Complete  Some recent data might be hidden

## 2019-08-30 NOTE — Progress Notes (Signed)
Physical Therapy Treatment Patient Details Name: Jaime [name] MRN: [id number] DOB: 1933-04-15 Today's Date: 08/30/2019    History of Present Illness Patient is an 86 year old female with a past medical history significant for dementia, hypothyroidism, hypertension, and a recent left hip surgery on 08/22/2019.  She sustained a fall on 08/25/2019 at skilled nursing facility.  She was brought to the emergency department at [facility] where she was found to have a distal femur periprosthetic fracture.  She was subsequently transferred to [facility] for further management due to the complexity of injury.  New Pt underwent ORIF and removal of hardware for her distal femur fracture 08/26/19.    PT Comments    Pt in bed upon arrival of PT, agreeable to PT session this am despite reports of sig pain. The pt continues to present with significant deficits in mobility and independence due to the above dx and subsequent pain and weakness. The pt required more assist today with all mobility and was limited in ambulation due to pain. The RN and NT were notified regarding needs for pain management and lift equipment for safe transfers given the pt's current performance. The pt will continue to benefit from skilled PT to maximize mobility, strength, and functional safety and independence.     Follow Up Recommendations  SNF;Supervision/Assistance - 24 hour     Equipment Recommendations  Other (comment)(I believe pt has all needed equipment from prior hospitalization)    Recommendations for Other Services       Precautions / Restrictions Precautions Precautions: Fall Restrictions Weight Bearing Restrictions: Yes LLE Weight Bearing: Weight bearing as tolerated    Mobility  Bed Mobility Overal bed mobility: Needs Assistance Bed Mobility: Sit to Supine     Supine to sit: Mod assist;+2 for physical assistance;HOB elevated     General bed mobility comments: min A with L LE to  EOB, mod A with elevating trunk. pt used rails with increased time and effort  Transfers Overall transfer level: Needs assistance Equipment used: Rolling walker (2 wheeled) Transfers: Sit to/from [facility] Sit to Stand: Mod assist;+2 physical assistance Stand pivot transfers: Mod assist;+2 physical assistance       General transfer comment: verbal cues for initiation and safety, pt very fearful and limited by pain. Pt sat impulsively a few times due to pain, reports being unable to stand, butis able to stand mutiple times with encouragement for safe positioning in recliner  Ambulation/Gait Ambulation/Gait assistance: Mod assist;+2 physical assistance Gait Distance (Feet): 2 Feet Assistive device: Rolling walker (2 wheeled) Gait Pattern/deviations: Step-to pattern;Shuffle;Decreased weight shift to left Gait velocity: decreased Gait velocity interpretation: <1.31 ft/sec, indicative of household ambulator General Gait Details: max vc's for technique and assist for walker management; antalgic; increased effort and time to perform minimal side steps to [facility] or recliner with minimal foot clearance of RLE   Stairs             Wheelchair Mobility    Modified Rankin (Stroke Patients Only)       Balance Overall balance assessment: Needs assistance Sitting-balance support: Feet supported Sitting balance-Leahy Scale: Fair Sitting balance - Comments: static sitting (pt leaning towards R side d/t L hip pain) Postural control: Right lateral lean Standing balance support: Bilateral upper extremity supported;During functional activity Standing balance-Leahy Scale: Poor Standing balance comment: pt requiring B UE support for static standing balance (pt tending to lean towards R side to offweight L LE)  Cognition Arousal/Alertness: Awake/alert Behavior During Therapy: WFL for tasks assessed/performed;Anxious Overall Cognitive  Status: No family/caregiver present to determine baseline cognitive functioning Area of Impairment: Orientation;Memory;Safety/judgement;Problem solving                 Orientation Level: Disoriented to;Time;Situation   Memory: Decreased short-term memory   Safety/Judgement: Decreased awareness of deficits;Decreased awareness of safety   Problem Solving: Slow processing;Decreased initiation;Requires verbal cues;Requires tactile cues General Comments: hx of dementia/cognitive impairments, pt reports multiple times she is fearful of falling and of not being able to walk again      Exercises      General Comments        Pertinent Vitals/Pain Pain Assessment: Faces Faces Pain Scale: Hurts whole lot Pain Location: L LE Pain Descriptors / Indicators: Grimacing;Guarding;Crying;Sore(moaning with all mobility) Pain Intervention(s): Limited activity within patient's tolerance;Monitored during session;Patient requesting pain meds-RN notified;Repositioned    Home Living                      Prior Function            PT Goals (current goals can now be found in the care plan section) Acute Rehab PT Goals Patient Stated Goal: to be able to walk again PT Goal Formulation: With patient Time For Goal Achievement: 09/06/19 Potential to Achieve Goals: Fair Progress towards PT goals: Not progressing toward goals - comment(pt unable to progress due to reports of sig more pain in LLE today, required more assist)    Frequency    Min 3X/week      PT Plan Current plan remains appropriate    Co-evaluation              AM-PAC PT "6 Clicks" Mobility   Outcome Measure  Help needed turning from your back to your side while in a flat bed without using bedrails?: A Little Help needed moving from lying on your back to sitting on the side of a flat bed without using bedrails?: A Lot Help needed moving to and from a bed to a chair (including a wheelchair)?: A Lot Help  needed standing up from a chair using your arms (e.g., wheelchair or bedside chair)?: A Lot Help needed to walk in hospital room?: A Lot Help needed climbing 3-5 steps with a railing? : Total 6 Click Score: 12    End of Session Equipment Utilized During Treatment: Gait belt Activity Tolerance: Patient limited by pain Patient left: with call bell/phone within reach;in chair;with chair alarm set Nurse Communication: Mobility status;Precautions;Weight bearing status;Patient requests pain meds PT Visit Diagnosis: Other abnormalities of gait and mobility (R26.89);Muscle weakness (generalized) (M62.81);History of falling (Z91.81);Difficulty in walking, not elsewhere classified (R26.2);Pain Pain - Right/Left: Left Pain - part of body: Hip;Leg     Time: 1032-1101 PT Time Calculation (min) (ACUTE ONLY): 29 min  Charges:  $Gait Training: 8-22 mins $Therapeutic Activity: 8-22 mins                     Rolm Baptise, PT, DPT   Acute Rehabilitation Department Pager #: [phone]  Gaetana Michaelis 08/30/2019, 12:36 PM

## 2019-08-31 LAB — CBC WITH DIFFERENTIAL/PLATELET
Abs Immature Granulocytes: 0.07 10*3/uL (ref 0.00–0.07)
Basophils Absolute: 0 10*3/uL (ref 0.0–0.1)
Basophils Relative: 1 %
Eosinophils Absolute: 0.2 10*3/uL (ref 0.0–0.5)
Eosinophils Relative: 4 %
HCT: 23.8 % — ABNORMAL LOW (ref 36.0–46.0)
Hemoglobin: 7.9 g/dL — ABNORMAL LOW (ref 12.0–15.0)
Immature Granulocytes: 2 %
Lymphocytes Relative: 13 %
Lymphs Abs: 0.6 10*3/uL — ABNORMAL LOW (ref 0.7–4.0)
MCH: 30.7 pg (ref 26.0–34.0)
MCHC: 33.2 g/dL (ref 30.0–36.0)
MCV: 92.6 fL (ref 80.0–100.0)
Monocytes Absolute: 0.5 10*3/uL (ref 0.1–1.0)
Monocytes Relative: 11 %
Neutro Abs: 3.2 10*3/uL (ref 1.7–7.7)
Neutrophils Relative %: 69 %
Platelets: 284 10*3/uL (ref 150–400)
RBC: 2.57 MIL/uL — ABNORMAL LOW (ref 3.87–5.11)
RDW: 12.8 % (ref 11.5–15.5)
WBC: 4.5 10*3/uL (ref 4.0–10.5)
nRBC: 0 % (ref 0.0–0.2)

## 2019-08-31 LAB — RENAL FUNCTION PANEL
Albumin: 2 g/dL — ABNORMAL LOW (ref 3.5–5.0)
Anion gap: 6 (ref 5–15)
BUN: 10 mg/dL (ref 8–23)
CO2: 23 mmol/L (ref 22–32)
Calcium: 8.1 mg/dL — ABNORMAL LOW (ref 8.9–10.3)
Chloride: 105 mmol/L (ref 98–111)
Creatinine, Ser: 0.82 mg/dL (ref 0.44–1.00)
GFR calc Af Amer: >60 mL/min (ref >60)
GFR calc non Af Amer: >60 mL/min (ref >60)
Glucose, Bld: 109 mg/dL — ABNORMAL HIGH (ref 70–99)
Phosphorus: 3.7 mg/dL (ref 2.5–4.6)
Potassium: 4.5 mmol/L (ref 3.5–5.1)
Sodium: 134 mmol/L — ABNORMAL LOW (ref 135–145)

## 2019-08-31 LAB — MAGNESIUM: Magnesium: 2.1 mg/dL (ref 1.7–2.4)

## 2019-08-31 NOTE — Plan of Care (Signed)

## 2019-08-31 NOTE — Progress Notes (Signed)
PROGRESS NOTE  Jaime [name] SEG:[id number] DOB: 1933-03-25 DOA: 08/26/2019 PCP: Ezequiel Kayser, MD   LOS: 5 days   Brief narrative: As per HPI,  Patient is an 86 year old female with past medical history significant for dementia, hypothyroidism, hypertension, and a recent left hip surgery on 08/22/2019. She sustained a fall on 08/25/2019 at skilled nursing facility. She was brought to the emergency department at [facility] where she was found to have a distal femur periprosthetic fracture. She was subsequently transferred to [facility] for further management due to the complexity of injury. Pt underwent ORIF and removal of hardware for her distal femur fracture on 08/26/19. Largely unremarkable surgery with plan for Lovenox for DVT prophylaxis and continued work with physical therapy and Occupational Therapy.  Assessment/Plan:  Principal Problem:   Closed fracture of left distal femur (HCC) Active Problems:   Dementia with behavioral disturbance (HCC)   Hypothyroidism   Normocytic anemia   Fall at home, initial encounter   Essential hypertension  Periprosthetic fracture of the left distal femur.  Status post open reduction and internal fixation with hardware removal on 08/26/2019.  Physical therapy has seen the patient and patient is weightbearing as tolerated at this time.  We will continue Lovenox for DVT prophylaxis.  Patient does not wish to return to her previous skilled nursing facility so new placement is underway.  Hypothyroidism Continue Synthroid.   Demential with behavoiral disturbance Continue supportive care.  Fall precautions  Hyponatremia Mild.  Improved from 131-134 today.  On Normal saline.  Check BMP in a.m.  Acute blood loss anemia postoperative. We will consider transfusion for hemoglobin less than 7.  Hemoglobin from today 7.9.  Essential hypertension Continue irbesartan  VTE Prophylaxis: Lovenox subcu  Code Status: Full  code  Family Communication: None.  Disposition Plan: Skilled nursing facility when bed is available.  Medically stable for disposition to skilled nursing facility.   Consultants:  Orthopedics  Procedures:  Status post ORIF with hardware removal of the left distal femur by orthopedics on 08/26/2019.  Antibiotics:  Anti-infectives (From admission, onward)   Start     Dose/Rate Route Frequency Ordered Stop   08/26/19 1915  ceFAZolin (ANCEF) IVPB 2g/100 mL premix     2 g 200 mL/hr over 30 Minutes Intravenous Every 8 hours 08/26/19 1833 08/27/19 1110   08/26/19 1640  vancomycin (VANCOCIN) powder  Status:  Discontinued       As needed 08/26/19 1640 08/26/19 1725   08/26/19 1330  ceFAZolin (ANCEF) IVPB 2g/100 mL premix     2 g 200 mL/hr over 30 Minutes Intravenous To Short Stay 08/26/19 1321 08/27/19 1330     Subjective: Today, patient denies any interval complaints.  Denies overt pain, nausea, vomiting, shortness of breath.  Has been eating well.  Objective: Vitals:   08/31/19 0239 08/31/19 0809  BP: (!) 144/52 (!) 143/56  Pulse: 80 86  Resp:  17  Temp: 97.8 F (36.6 C) 98.4 F (36.9 C)  SpO2: 94% 93%    Intake/Output Summary (Last 24 hours) at 08/31/2019 1124 Last data filed at 08/31/2019 0900 Gross per 24 hour  Intake 720 ml  Output --  Net 720 ml   Filed Weights   08/26/19 1455  Weight: 70.9 kg   Body mass index is 23.77 kg/m.   Physical Exam: GENERAL: Patient is alert awake and communicative.  Not in obvious distress.  Calm and cooperative. HENT: No scleral pallor or icterus. Pupils equally reactive to light. Oral mucosa  is moist NECK: is supple, no palpable thyroid enlargement. CHEST: Clear to auscultation. No crackles or wheezes. Non tender on palpation. Diminished breath sounds bilaterally. CVS: S1 and S2 heard, no murmur. Regular rate and rhythm. No pericardial rub. ABDOMEN: Soft, non-tender, bowel sounds are present. EXTREMITIES: Left lower  extremity-clean dry and intact incision site. CNS: Cranial nerves are intact.  Moving all extremities.  Has history of dementia. SKIN: warm and dry without rashes.  Data Review: I have personally reviewed the following laboratory data and studies,  CBC: Recent Labs  Lab 08/26/19 1404 08/27/19 0431 08/28/19 0300 08/29/19 0302 08/31/19 0354  WBC 9.4 7.5 8.3 7.5 4.5  NEUTROABS  --   --   --  5.1 3.2  HGB 9.5* 8.2* 8.2* 7.8* 7.9*  HCT 28.3* 25.1* 24.0* 24.0* 23.8*  MCV 92.8 93.7 92.3 94.1 92.6  PLT 265 250 265 289 284   Basic Metabolic Panel: Recent Labs  Lab 08/25/19 1633 08/26/19 1404 08/27/19 1010 08/28/19 0300 08/29/19 0302 08/31/19 0354  NA 132*  --  132* 130* 131* 134*  K 4.2  --  4.0 4.1 4.1 4.5  CL 98  --  100 98 102 105  CO2 22  --  21* 22 22 23   GLUCOSE 125*  --  149* 111* 118* 109*  BUN 11  --  13 13 13 10   CREATININE 0.62 0.80 0.74 0.71 0.72 0.82  CALCIUM 8.7*  --  8.3* 8.1* 7.9* 8.1*  MG  --   --   --   --  2.0 2.1  PHOS  --   --   --   --  2.9 3.7   Liver Function Tests: Recent Labs  Lab 08/29/19 0302 08/31/19 0354  ALBUMIN 2.1* 2.0*   No results for input(s): LIPASE, AMYLASE in the last 168 hours. No results for input(s): AMMONIA in the last 168 hours. Cardiac Enzymes: No results for input(s): CKTOTAL, CKMB, CKMBINDEX, TROPONINI in the last 168 hours. BNP (last 3 results) No results for input(s): BNP in the last 8760 hours.  ProBNP (last 3 results) No results for input(s): PROBNP in the last 8760 hours.  CBG: No results for input(s): GLUCAP in the last 168 hours. Recent Results (from the past 240 hour(s))  SARS CORONAVIRUS 2 (TAT 6-24 HRS) Nasopharyngeal Nasopharyngeal Swab     Status: None   Collection Time: 08/21/19  7:41 PM   Specimen: Nasopharyngeal Swab  Result Value Ref Range Status   SARS Coronavirus 2 NEGATIVE NEGATIVE Final    Comment: (NOTE) SARS-CoV-2 target nucleic acids are NOT DETECTED. The SARS-CoV-2 RNA is generally  detectable in upper and lower respiratory specimens during the acute phase of infection. Negative results do not preclude SARS-CoV-2 infection, do not rule out co-infections with other pathogens, and should not be used as the sole basis for treatment or other patient management decisions. Negative results must be combined with clinical observations, patient history, and epidemiological information. The expected result is Negative. Fact Sheet for Patients: 09/02/19 Fact Sheet for Healthcare Providers: 10/19/19 This test is not yet approved or cleared by the [place] FDA and  has been authorized for detection and/or diagnosis of SARS-CoV-2 by FDA under an Emergency Use Authorization (EUA). This EUA will remain  in effect (meaning this test can be used) for the duration of the COVID-19 declaration under Section 56 4(b)(1) of the Act, 21 U.S.C. section 360bbb-3(b)(1), unless the authorization is terminated or revoked sooner. Performed at [facility], [address] [place], [place] [address]   Respiratory Panel by RT PCR (Flu A&B, Covid) - Nasopharyngeal Swab     Status: None   Collection Time: 08/25/19  6:52 PM   Specimen: Nasopharyngeal Swab  Result Value Ref Range Status   SARS Coronavirus 2 by RT PCR NEGATIVE NEGATIVE Final    Comment: (NOTE) SARS-CoV-2 target nucleic acids are NOT DETECTED. The SARS-CoV-2 RNA is generally detectable in upper respiratoy specimens during the acute phase of infection. The lowest concentration of SARS-CoV-2 viral copies this assay can detect is 131 copies/mL. A negative result does not preclude SARS-Cov-2 infection and should not be used as the sole basis for treatment or other patient management decisions. A negative result may occur with  improper specimen collection/handling, submission of specimen other than nasopharyngeal swab, presence of viral mutation(s) within  the areas targeted by this assay, and inadequate number of viral copies (<131 copies/mL). A negative result must be combined with clinical observations, patient history, and epidemiological information. The expected result is Negative. Fact Sheet for Patients:  https://www.moore.com/ Fact Sheet for Healthcare Providers:  https://www.young.biz/ This test is not yet ap proved or cleared by the [place] FDA and  has been authorized for detection and/or diagnosis of SARS-CoV-2 by FDA under an Emergency Use Authorization (EUA). This EUA will remain  in effect (meaning this test can be used) for the duration of the COVID-19 declaration under Section 564(b)(1) of the Act, 21 U.S.C. section 360bbb-3(b)(1), unless the authorization is terminated or revoked sooner.    Influenza A by PCR NEGATIVE NEGATIVE Final   Influenza B by PCR NEGATIVE NEGATIVE Final    Comment: (NOTE) The Xpert Xpress SARS-CoV-2/FLU/RSV assay is intended as an aid in  the diagnosis of influenza from Nasopharyngeal swab specimens and  should not be used as a sole basis for treatment. Nasal washings and  aspirates are unacceptable for Xpert Xpress SARS-CoV-2/FLU/RSV  testing. Fact Sheet for Patients: https://www.moore.com/ Fact Sheet for Healthcare Providers: https://www.young.biz/ This test is not yet approved or cleared by the [place] FDA and  has been authorized for detection and/or diagnosis of SARS-CoV-2 by  FDA under an Emergency Use Authorization (EUA). This EUA will remain  in effect (meaning this test can be used) for the duration of the  Covid-19 declaration under Section 564(b)(1) of the Act, 21  U.S.C. section 360bbb-3(b)(1), unless the authorization is  terminated or revoked. Performed at [facility], [address] [place], [place] [address]   Surgical pcr screen     Status: Abnormal   Collection Time:  08/26/19  1:06 PM   Specimen: Nasal Mucosa; Nasal Swab  Result Value Ref Range Status   MRSA, PCR NEGATIVE NEGATIVE Final   Staphylococcus aureus POSITIVE (A) NEGATIVE Final    Comment: (NOTE) The Xpert SA Assay (FDA approved for NASAL specimens in patients 40 years of age and older), is one component of a comprehensive surveillance program. It is not intended to diagnose infection nor to guide or monitor treatment. Performed at [facility], [address] [place], [place] [address]      Studies: No results found.  Scheduled Meds: . cholecalciferol  2,000 Units Oral BID  . docusate sodium  100 mg Oral BID  . enoxaparin (LOVENOX) injection  40 mg Subcutaneous Q24H  . gabapentin  100 mg Oral TID  . irbesartan  75 mg Oral Daily  . levothyroxine  137 mcg Oral QAC breakfast  . mirtazapine  15 mg Oral QHS  .  mupirocin ointment  1 application Nasal BID  . senna  1 tablet Oral BID    Continuous Infusions: . sodium chloride 50 mL/hr at 08/28/19 1741  . methocarbamol (ROBAXIN) IV       Joycelyn Das, MD  [facility] 08/31/2019

## 2019-08-31 NOTE — Progress Notes (Signed)
Orthopaedic Trauma Progress Note  S: Doing well this morning, pain in leg is fairly well controlled.   No questions or concerns currently  O:  Vitals:   08/31/19 0239 08/31/19 0809  BP: (!) 144/52 (!) 143/56  Pulse: 80 86  Resp:  17  Temp: 97.8 F (36.6 C) 98.4 F (36.9 C)  SpO2: 94% 93%    General - Sitting up in bed, no acute distress.  Respiratory -  No increased work of breathing.  Left lower extremity - Incisions are clean, dry, and intact.  Mild tenderness with palpation of distal femur and the knee.  There is some bruising over the knee which is to be expected.  Nontender in the lower leg.  Tolerates a very small amount of passive motion of the knee.  Compartments are soft and compressible.  Ankle dorsiflexion/ plantarflexion is intact.  Motor and sensory function is intact.  2+ DP pulse.  Imaging: Stable post op imaging.   Labs:  Results for orders placed or performed during the hospital encounter of 08/26/19 (from the past 24 hour(s))  CBC with Differential/Platelet     Status: Abnormal   Collection Time: 08/31/19  3:54 AM  Result Value Ref Range   WBC 4.5 4.0 - 10.5 K/uL   RBC 2.57 (L) 3.87 - 5.11 MIL/uL   Hemoglobin 7.9 (L) 12.0 - 15.0 g/dL   HCT 23.8 (L) 36.0 - 46.0 %   MCV 92.6 80.0 - 100.0 fL   MCH 30.7 26.0 - 34.0 pg   MCHC 33.2 30.0 - 36.0 g/dL   RDW 12.8 11.5 - 15.5 %   Platelets 284 150 - 400 K/uL   nRBC 0.0 0.0 - 0.2 %   Neutrophils Relative % 69 %   Neutro Abs 3.2 1.7 - 7.7 K/uL   Lymphocytes Relative 13 %   Lymphs Abs 0.6 (L) 0.7 - 4.0 K/uL   Monocytes Relative 11 %   Monocytes Absolute 0.5 0.1 - 1.0 K/uL   Eosinophils Relative 4 %   Eosinophils Absolute 0.2 0.0 - 0.5 K/uL   Basophils Relative 1 %   Basophils Absolute 0.0 0.0 - 0.1 K/uL   Immature Granulocytes 2 %   Abs Immature Granulocytes 0.07 0.00 - 0.07 K/uL  Magnesium     Status: None   Collection Time: 08/31/19  3:54 AM  Result Value Ref Range   Magnesium 2.1 1.7 - 2.4 mg/dL  Renal  function panel     Status: Abnormal   Collection Time: 08/31/19  3:54 AM  Result Value Ref Range   Sodium 134 (L) 135 - 145 mmol/L   Potassium 4.5 3.5 - 5.1 mmol/L   Chloride 105 98 - 111 mmol/L   CO2 23 22 - 32 mmol/L   Glucose, Bld 109 (H) 70 - 99 mg/dL   BUN 10 8 - 23 mg/dL   Creatinine, Ser 0.82 0.44 - 1.00 mg/dL   Calcium 8.1 (L) 8.9 - 10.3 mg/dL   Phosphorus 3.7 2.5 - 4.6 mg/dL   Albumin 2.0 (L) 3.5 - 5.0 g/dL   GFR calc non Af Amer >60 >60 mL/min   GFR calc Af Amer >60 >60 mL/min   Anion gap 6 5 - 15    Assessment: 86 year old female status post fall  Injuries: Left periprosthetic distal femur fracture status post removal of distal screws and ORIF on 08/26/2019  Weightbearing: WBAT LLE  Insicional and dressing care: Okay for incisions to be left open to air  Showering: Okay to shower  from Ortho standpoint, Steri-Strips can get wet.  Orthopedic device(s): None   CV/Blood loss: Acute blood loss anemia, Hgb 7.9 this morning. Hemodynamically stable.  Continue to monitor CBC  Pain management:  1. Tylenol 325-650 mg q 6 hours PRN 2. Robaxin 500 mg q 6 hours PRN 3.  Hydrocodone 5-325 mg q 6 hours PRN 4. Neurontin 100 mg TID 5.  Morphine 2 mg q 2 hours PRN  VTE prophylaxis: Lovenox  ID: Ancef 2gm post op completed  Foley/Lines:  No foley, KVO IVFs  Medical co-morbidities: Dementia, hypertension, hypothyroidism  Impediments to Fracture Healing: Vitamin D level is on the low end of normal.  Will start patient on vitamin D3 supplementation.  This should be continued at discharge  Dispo: Up with therapies as tolerated.  Okay for discharge to SNF from ortho standpoint once cleared by medicine team and therapies  Follow - up plan: 2 weeks for repeat x-rays  Contact information:  Truitt Merle MD, Ulyses Southward PA-C   [facility] [phone] (office) [email or web address]

## 2019-09-01 LAB — BASIC METABOLIC PANEL
Anion gap: 8 (ref 5–15)
BUN: 9 mg/dL (ref 8–23)
CO2: 22 mmol/L (ref 22–32)
Calcium: 8.1 mg/dL — ABNORMAL LOW (ref 8.9–10.3)
Chloride: 104 mmol/L (ref 98–111)
Creatinine, Ser: 0.77 mg/dL (ref 0.44–1.00)
GFR calc Af Amer: >60 mL/min (ref >60)
GFR calc non Af Amer: >60 mL/min (ref >60)
Glucose, Bld: 109 mg/dL — ABNORMAL HIGH (ref 70–99)
Potassium: 4 mmol/L (ref 3.5–5.1)
Sodium: 134 mmol/L — ABNORMAL LOW (ref 135–145)

## 2019-09-01 LAB — CBC
HCT: 23.4 % — ABNORMAL LOW (ref 36.0–46.0)
Hemoglobin: 7.7 g/dL — ABNORMAL LOW (ref 12.0–15.0)
MCH: 30.8 pg (ref 26.0–34.0)
MCHC: 32.9 g/dL (ref 30.0–36.0)
MCV: 93.6 fL (ref 80.0–100.0)
Platelets: 380 10*3/uL (ref 150–400)
RBC: 2.5 MIL/uL — ABNORMAL LOW (ref 3.87–5.11)
RDW: 13 % (ref 11.5–15.5)
WBC: 6.1 10*3/uL (ref 4.0–10.5)
nRBC: 0 % (ref 0.0–0.2)

## 2019-09-01 LAB — MAGNESIUM: Magnesium: 2.1 mg/dL (ref 1.7–2.4)

## 2019-09-01 LAB — SARS CORONAVIRUS 2 (TAT 6-24 HRS): SARS Coronavirus 2: NEGATIVE

## 2019-09-01 MED ORDER — DOCUSATE SODIUM 100 MG PO CAPS: 100.0000 mg | ORAL_CAPSULE | Freq: Two times a day (BID) | ORAL | 0 refills | Status: DC

## 2019-09-01 MED ORDER — ONDANSETRON HCL 4 MG PO TABS: 4.0000 mg | ORAL_TABLET | Freq: Four times a day (QID) | ORAL | Status: DC | PRN

## 2019-09-01 MED ORDER — CHOLECALCIFEROL 50 MCG (2000 UT) PO CAPS: 2000.0000 [IU] | ORAL_CAPSULE | Freq: Two times a day (BID) | ORAL | Status: AC

## 2019-09-01 MED ORDER — METHOCARBAMOL 500 MG PO TABS: 500.0000 mg | ORAL_TABLET | Freq: Four times a day (QID) | ORAL | Status: DC | PRN

## 2019-09-01 MED ORDER — GABAPENTIN 100 MG PO CAPS: 100.0000 mg | ORAL_CAPSULE | Freq: Three times a day (TID) | ORAL | Status: DC

## 2019-09-01 MED ORDER — HYDROCODONE-ACETAMINOPHEN 5-325 MG PO TABS: 1.0000 | ORAL_TABLET | Freq: Four times a day (QID) | ORAL | 0 refills | Status: DC | PRN

## 2019-09-01 NOTE — Care Management Important Message (Signed)
Important Message  Patient Details  Name: Jaime [name] MRN: [id number] Date of Birth: 03/29/1933   Medicare Important Message Given:  Yes     Mardene Sayer 09/01/2019, 11:41 AM

## 2019-09-01 NOTE — Progress Notes (Signed)
PROGRESS NOTE  Jaime [name] CXK:[id number] DOB: 11/30/1932 DOA: 08/26/2019 PCP: Mickey Farber, MD   LOS: 6 days   Brief narrative: As per HPI,  Patient is an 86 year old female with past medical history significant for dementia, hypothyroidism, hypertension, and a recent left hip surgery on 08/22/2019. She sustained a fall on 08/25/2019 at skilled nursing facility. She was brought to the emergency department at [facility] where she was found to have a distal femur periprosthetic fracture. She was subsequently transferred to [facility] for further management due to the complexity of injury. Pt underwent ORIF and removal of hardware for her distal femur fracture on 08/26/19. Largely unremarkable surgery with plan for Lovenox for DVT prophylaxis and continued work with physical therapy and Occupational Therapy.  Physical therapy has recommended skilled nursing facility placement on discharge  Assessment/Plan:  Principal Problem:   Closed fracture of left distal femur (HCC) Active Problems:   Dementia with behavioral disturbance (HCC)   Hypothyroidism   Normocytic anemia   Fall at home, initial encounter   Essential hypertension  Periprosthetic fracture of the left distal femur.  Status post open reduction and internal fixation with hardware removal on 08/26/2019.  Physical therapy on board and patient is weightbearing as tolerated at this time.  We will continue Lovenox for DVT prophylaxis. Seen by orthopedics during hospitalization.  Patient will need to follow-up with surgery as outpatient in 2 weeks.. Social worker on board for placement.    Hypothyroidism Continue Synthroid.   Demential with behavoiral disturbance Continue supportive care.  Fall precautions  Hyponatremia Mild. Has improved. Na of 134.  Will discontinue DC iv fluids.  Acute blood loss anemia postoperative. We will consider transfusion for hemoglobin less than 7.  Hemoglobin from today  7.7.  No external bleeding noted.  Essential hypertension Continue irbesartan.  Blood pressure seems to be stable.  VTE Prophylaxis: Lovenox subcu  Code Status: Full code  Family Communication:  None today  Disposition Plan:  Skilled nursing facility when bed is available.  Medically stable for disposition to skilled nursing facility.   Consultants:  Orthopedics  Procedures:  Status post ORIF with hardware removal of the left distal femur by orthopedics on 08/26/2019.  Antibiotics:  Anti-infectives (From admission, onward)   Start     Dose/Rate Route Frequency Ordered Stop   08/26/19 1915  ceFAZolin (ANCEF) IVPB 2g/100 mL premix     2 g 200 mL/hr over 30 Minutes Intravenous Every 8 hours 08/26/19 1833 08/27/19 1110   08/26/19 1640  vancomycin (VANCOCIN) powder  Status:  Discontinued       As needed 08/26/19 1640 08/26/19 1725   08/26/19 1330  ceFAZolin (ANCEF) IVPB 2g/100 mL premix     2 g 200 mL/hr over 30 Minutes Intravenous To Short Stay 08/26/19 1321 08/27/19 1330     Subjective: Today, patient denies interval complaints.  Patient feels slightly thirsty.  Denies overt pain.  Objective: Vitals:   09/01/19 0318 09/01/19 0728  BP: (!) 109/93 (!) 138/55  Pulse: 91 84  Resp: 15 18  Temp: 98.2 F (36.8 C) 98.7 F (37.1 C)  SpO2: 94% 98%    Intake/Output Summary (Last 24 hours) at 09/01/2019 0825 Last data filed at 09/01/2019 0500 Gross per 24 hour  Intake 480 ml  Output 400 ml  Net 80 ml   Filed Weights   08/26/19 1455  Weight: 70.9 kg   Body mass index is 23.77 kg/m.   Physical Exam:  GENERAL: Patient is alert  awake and communicative.  Not in obvious distress.  Calm and cooperative. HENT: No scleral pallor or icterus. Pupils equally reactive to light. Oral mucosa is moist NECK: is supple, no palpable thyroid enlargement. CHEST: Clear to auscultation. No crackles or wheezes. Non tender on palpation. Diminished breath sounds bilaterally. CVS: S1 and  S2 heard, no murmur. Regular rate and rhythm. No pericardial rub. ABDOMEN: Soft, non-tender, bowel sounds are present.  External Foley catheter in place. EXTREMITIES: Left lower extremity-clean dry and intact incision site with Steri-Strips and mild edema.. CNS: Cranial nerves are intact.  Moving all extremities.  Has history of dementia. SKIN: warm and dry without rashes.  Data Review: I have personally reviewed the following laboratory data and studies,  CBC: Recent Labs  Lab 08/27/19 0431 08/28/19 0300 08/29/19 0302 08/31/19 0354 09/01/19 0541  WBC 7.5 8.3 7.5 4.5 6.1  NEUTROABS  --   --  5.1 3.2  --   HGB 8.2* 8.2* 7.8* 7.9* 7.7*  HCT 25.1* 24.0* 24.0* 23.8* 23.4*  MCV 93.7 92.3 94.1 92.6 93.6  PLT 250 265 289 284 380   Basic Metabolic Panel: Recent Labs  Lab 08/27/19 1010 08/28/19 0300 08/29/19 0302 08/31/19 0354 09/01/19 0541  NA 132* 130* 131* 134* 134*  K 4.0 4.1 4.1 4.5 4.0  CL 100 98 102 105 104  CO2 21* 22 22 23 22   GLUCOSE 149* 111* 118* 109* 109*  BUN 13 13 13 10 9   CREATININE 0.74 0.71 0.72 0.82 0.77  CALCIUM 8.3* 8.1* 7.9* 8.1* 8.1*  MG  --   --  2.0 2.1 2.1  PHOS  --   --  2.9 3.7  --    Liver Function Tests: Recent Labs  Lab 08/29/19 0302 08/31/19 0354  ALBUMIN 2.1* 2.0*   No results for input(s): LIPASE, AMYLASE in the last 168 hours. No results for input(s): AMMONIA in the last 168 hours. Cardiac Enzymes: No results for input(s): CKTOTAL, CKMB, CKMBINDEX, TROPONINI in the last 168 hours. BNP (last 3 results) No results for input(s): BNP in the last 8760 hours.  ProBNP (last 3 results) No results for input(s): PROBNP in the last 8760 hours.  CBG: No results for input(s): GLUCAP in the last 168 hours. Recent Results (from the past 240 hour(s))  Respiratory Panel by RT PCR (Flu A&B, Covid) - Nasopharyngeal Swab     Status: None   Collection Time: 08/25/19  6:52 PM   Specimen: Nasopharyngeal Swab  Result Value Ref Range Status   SARS  Coronavirus 2 by RT PCR NEGATIVE NEGATIVE Final    Comment: (NOTE) SARS-CoV-2 target nucleic acids are NOT DETECTED. The SARS-CoV-2 RNA is generally detectable in upper respiratoy specimens during the acute phase of infection. The lowest concentration of SARS-CoV-2 viral copies this assay can detect is 131 copies/mL. A negative result does not preclude SARS-Cov-2 infection and should not be used as the sole basis for treatment or other patient management decisions. A negative result may occur with  improper specimen collection/handling, submission of specimen other than nasopharyngeal swab, presence of viral mutation(s) within the areas targeted by this assay, and inadequate number of viral copies (<131 copies/mL). A negative result must be combined with clinical observations, patient history, and epidemiological information. The expected result is Negative. Fact Sheet for Patients:  09/02/19 Fact Sheet for Healthcare Providers:  10/23/19 This test is not yet ap proved or cleared by the https://www.moore.com/ FDA and  has been authorized for detection and/or diagnosis of SARS-CoV-2 by  FDA under an Emergency Use Authorization (EUA). This EUA will remain  in effect (meaning this test can be used) for the duration of the COVID-19 declaration under Section 564(b)(1) of the Act, 21 U.S.C. section 360bbb-3(b)(1), unless the authorization is terminated or revoked sooner.    Influenza A by PCR NEGATIVE NEGATIVE Final   Influenza B by PCR NEGATIVE NEGATIVE Final    Comment: (NOTE) The Xpert Xpress SARS-CoV-2/FLU/RSV assay is intended as an aid in  the diagnosis of influenza from Nasopharyngeal swab specimens and  should not be used as a sole basis for treatment. Nasal washings and  aspirates are unacceptable for Xpert Xpress SARS-CoV-2/FLU/RSV  testing. Fact Sheet for Patients: PinkCheek.be Fact Sheet  for Healthcare Providers: GravelBags.it This test is not yet approved or cleared by the [place] FDA and  has been authorized for detection and/or diagnosis of SARS-CoV-2 by  FDA under an Emergency Use Authorization (EUA). This EUA will remain  in effect (meaning this test can be used) for the duration of the  Covid-19 declaration under Section 564(b)(1) of the Act, 21  U.S.C. section 360bbb-3(b)(1), unless the authorization is  terminated or revoked. Performed at [facility], [address], [place], [place] [address]   Surgical pcr screen     Status: Abnormal   Collection Time: 08/26/19  1:06 PM   Specimen: Nasal Mucosa; Nasal Swab  Result Value Ref Range Status   MRSA, PCR NEGATIVE NEGATIVE Final   Staphylococcus aureus POSITIVE (A) NEGATIVE Final    Comment: (NOTE) The Xpert SA Assay (FDA approved for NASAL specimens in patients 37 years of age and older), is one component of a comprehensive surveillance program. It is not intended to diagnose infection nor to guide or monitor treatment. Performed at [facility], [place] [address], [place],  [address]      Studies: No results found.  Scheduled Meds: . cholecalciferol  2,000 Units Oral BID  . docusate sodium  100 mg Oral BID  . enoxaparin (LOVENOX) injection  40 mg Subcutaneous Q24H  . gabapentin  100 mg Oral TID  . irbesartan  75 mg Oral Daily  . levothyroxine  137 mcg Oral QAC breakfast  . mirtazapine  15 mg Oral QHS  . senna  1 tablet Oral BID    Continuous Infusions: . sodium chloride 50 mL/hr at 08/28/19 1741  . methocarbamol (ROBAXIN) IV       Flora Lipps, MD  [facility] 09/01/2019

## 2019-09-01 NOTE — Progress Notes (Signed)
Unable to hang fluids d/t pt AMS, pt continuously questioned the lines and didn't want them

## 2019-09-01 NOTE — Progress Notes (Signed)
Occupational Therapy Treatment Patient Details Name: Jaime [name] MRN: [id number] DOB: 10-22-1932 Today's Date: 09/01/2019    History of present illness Patient is an 86 year old female with a past medical history significant for dementia, hypothyroidism, hypertension, and a recent left hip surgery on 08/22/2019.  She sustained a fall on 08/25/2019 at skilled nursing facility.  She was brought to the emergency department at [facility] where she was found to have a distal femur periprosthetic fracture.  She was subsequently transferred to [facility] for further management due to the complexity of injury.  New Pt underwent ORIF and removal of hardware for her distal femur fracture 08/26/19.   OT comments  Pt in be upon arrival, pleasantly confused and anxious. Pt limited by pain, however was agreeable to rolling in bed to adjust linens and required mod A for rolling. Ptalso agreeable to try sitting EOB and required max A and only able to sit EOB x 4 1/2 minutes before initiating a return to supine without assist. While sitting EOB, she was able to simulate UB bathing with min A and don clean gown min - min guard A. Pt returned to supine with max A and total A to position to [facility]. OT will continue to follow acutely  Follow Up Recommendations  SNF    Equipment Recommendations  None recommended by OT;Other (comment)(TBD at SNF)    Recommendations for Other Services      Precautions / Restrictions Precautions Precautions: Fall Restrictions Weight Bearing Restrictions: No LLE Weight Bearing: Weight bearing as tolerated       Mobility Bed Mobility Overal bed mobility: Needs Assistance Bed Mobility: Sit to Supine;Rolling Rolling: Mod assist   Supine to sit: HOB elevated;Max assist Sit to supine: Max assist   General bed mobility comments: pt moaning in pain  Transfers                 General transfer comment: unable secondary to pain    Balance Overall  balance assessment: Needs assistance Sitting-balance support: Feet supported Sitting balance-Leahy Scale: Fair Sitting balance - Comments: static sitting (pt leaning towards R side d/t L hip pain) Postural control: Right lateral lean Standing balance support: Bilateral upper extremity supported;During functional activity Standing balance-Leahy Scale: Poor                             ADL either performed or assessed with clinical judgement   ADL Overall ADL's : Needs assistance/impaired     Grooming: Wash/dry face;Wash/dry hands;Min guard;Sitting   Upper Body Bathing: Min guard Upper Body Bathing Details (indicate cue type and reason): simulated Lower Body Bathing: Maximal assistance Lower Body Bathing Details (indicate cue type and reason): simulate Upper Body Dressing : Minimal assistance;Min guard;Sitting                     General ADL Comments: pt very limited by pain, anxiety and fearfulness     Vision Baseline Vision/History: Wears glasses Wears Glasses: Reading only Patient Visual Report: No change from baseline     Perception     Praxis      Cognition Arousal/Alertness: Awake/alert Behavior During Therapy: WFL for tasks assessed/performed;Anxious Overall Cognitive Status: No family/caregiver present to determine baseline cognitive functioning Area of Impairment: Orientation;Memory;Safety/judgement;Problem solving                     Memory: Decreased short-term memory   Safety/Judgement: Decreased awareness of  deficits;Decreased awareness of safety   Problem Solving: Slow processing;Decreased initiation;Requires verbal cues;Requires tactile cues General Comments: hx of dementia/cognitive impairments, pt reports multiple times she is fearful of falling and of not being able to walk again        Exercises Total Joint Exercises Ankle Circles/Pumps: AROM;Both;10 reps;Supine Quad Sets: AROM;Left;5 reps;Supine Heel Slides:  AAROM;Left;5 reps;Supine   Shoulder Instructions       General Comments Pt sitting in wattery BM on arrival. pt was assisted with peri care. By end of session pt required additonal peri care as stool continued to leek out during mobilities.    Pertinent Vitals/ Pain       Pain Assessment: Faces Faces Pain Scale: Hurts even more Pain Location: L LE Pain Descriptors / Indicators: Grimacing;Guarding;Crying;Sore;Moaning Pain Intervention(s): Limited activity within patient's tolerance;Monitored during session;Repositioned  Home Living                                          Prior Functioning/Environment              Frequency  Min 2X/week        Progress Toward Goals  OT Goals(current goals can now be found in the care plan section)  Progress towards OT goals: OT to reassess next treatment  Acute Rehab OT Goals Patient Stated Goal: to be able to walk again  Plan Discharge plan remains appropriate    Co-evaluation                 AM-PAC OT "6 Clicks" Daily Activity     Outcome Measure   Help from another person eating meals?: None Help from another person taking care of personal grooming?: A Little Help from another person toileting, which includes using toliet, bedpan, or urinal?: Total Help from another person bathing (including washing, rinsing, drying)?: A Lot Help from another person to put on and taking off regular upper body clothing?: A Little Help from another person to put on and taking off regular lower body clothing?: Total 6 Click Score: 14    End of Session    OT Visit Diagnosis: Unsteadiness on feet (R26.81);Other abnormalities of gait and mobility (R26.89);Muscle weakness (generalized) (M62.81);History of falling (Z91.81);Other symptoms and signs involving cognitive function;Pain Pain - Right/Left: Left Pain - part of body: Hip;Leg   Activity Tolerance Patient limited by pain   Patient Left in bed;with call bell/phone  within reach;with bed alarm set   Nurse Communication          Time: 4854-6270 OT Time Calculation (min): 16 min  Charges: OT General Charges $OT Visit: 1 Visit OT Treatments $Therapeutic Activity: 8-22 mins     Britt Bottom 09/01/2019, 2:40 PM

## 2019-09-01 NOTE — Plan of Care (Signed)

## 2019-09-01 NOTE — Plan of Care (Signed)
  Problem: Safety: Goal: Ability to remain free from injury will improve Outcome: Progressing   

## 2019-09-01 NOTE — Progress Notes (Addendum)
NCM received authorization for SNF: [id number]. Reference [id number]. Updated COVID pending... Gae Gallop RN,BSN,CM

## 2019-09-01 NOTE — Progress Notes (Signed)
Physical Therapy Treatment Patient Details Name: Jaime [name] MRN: [id number] DOB: October 16, 1932 Today's Date: 09/01/2019    History of Present Illness Patient is an 86 year old female with a past medical history significant for dementia, hypothyroidism, hypertension, and a recent left hip surgery on 08/22/2019.  She sustained a fall on 08/25/2019 at skilled nursing facility.  She was brought to the emergency department at [facility] where she was found to have a distal femur periprosthetic fracture.  She was subsequently transferred to [facility] for further management due to the complexity of injury.  New Pt underwent ORIF and removal of hardware for her distal femur fracture 08/26/19.    PT Comments    Pt reporting high levels of pain and dizziness on arrival, hesitant but agreeable to work with therapy. She performed supine ther ex, rolled in bed for peri care, and sat EOB. Once sitting EOB pt began begging to return to supine secondary to pain. She was able to tolerate sitting upright for ~ 2 min before returning to supine in bed. Pt incontinent of bows throughout session, requiring several changes in her bed pads and assist with peri care. Will continue to follow for mobility progression.    Follow Up Recommendations  SNF;Supervision/Assistance - 24 hour     Equipment Recommendations  Other (comment)(I believe pt has all needed equipment from prior hospitalization)    Recommendations for Other Services       Precautions / Restrictions Precautions Precautions: Fall Restrictions Weight Bearing Restrictions: Yes LLE Weight Bearing: Weight bearing as tolerated    Mobility  Bed Mobility Overal bed mobility: Needs Assistance Bed Mobility: Sit to Supine;Rolling Rolling: Mod assist   Supine to sit: Mod assist;+2 for physical assistance;HOB elevated Sit to supine: Mod assist;+2 for physical assistance   General bed mobility comments: Pt required mod A to roll  several times to L and R for peri care. mod A +2 required to come to sitting EOB and return to supine. Pt crying out in pain with all mobility  Transfers                 General transfer comment: unable secondary to pain  Ambulation/Gait                 Stairs             Wheelchair Mobility    Modified Rankin (Stroke Patients Only)       Balance Overall balance assessment: Needs assistance Sitting-balance support: Feet supported Sitting balance-Leahy Scale: Fair Sitting balance - Comments: static sitting (pt leaning towards R side d/t L hip pain) Postural control: Right lateral lean Standing balance support: Bilateral upper extremity supported;During functional activity Standing balance-Leahy Scale: Poor                              Cognition Arousal/Alertness: Awake/alert Behavior During Therapy: WFL for tasks assessed/performed;Anxious Overall Cognitive Status: No family/caregiver present to determine baseline cognitive functioning                                 General Comments: hx of dementia/cognitive impairments, pt reports multiple times she is fearful of falling and of not being able to walk again      Exercises Total Joint Exercises Ankle Circles/Pumps: AROM;Both;10 reps;Supine Quad Sets: AROM;Left;5 reps;Supine Heel Slides: AAROM;Left;5 reps;Supine    General Comments General comments (  skin integrity, edema, etc.): Pt sitting in wattery BM on arrival. pt was assisted with peri care. By end of session pt required additonal peri care as stool continued to leek out during mobilities.      Pertinent Vitals/Pain Pain Assessment: Faces Faces Pain Scale: Hurts whole lot Pain Location: L LE Pain Descriptors / Indicators: Grimacing;Guarding;Crying;Sore;Moaning(moaning with all mobility) Pain Intervention(s): Monitored during session;Limited activity within patient's tolerance    Home Living                       Prior Function            PT Goals (current goals can now be found in the care plan section) Acute Rehab PT Goals Patient Stated Goal: to be able to walk again PT Goal Formulation: With patient Time For Goal Achievement: 09/06/19 Potential to Achieve Goals: Fair Progress towards PT goals: Progressing toward goals(pain limiting progression)    Frequency    Min 3X/week      PT Plan Current plan remains appropriate    Co-evaluation              AM-PAC PT "6 Clicks" Mobility   Outcome Measure  Help needed turning from your back to your side while in a flat bed without using bedrails?: A Little Help needed moving from lying on your back to sitting on the side of a flat bed without using bedrails?: A Lot Help needed moving to and from a bed to a chair (including a wheelchair)?: A Lot Help needed standing up from a chair using your arms (e.g., wheelchair or bedside chair)?: A Lot Help needed to walk in hospital room?: A Lot Help needed climbing 3-5 steps with a railing? : Total 6 Click Score: 12    End of Session Equipment Utilized During Treatment: Gait belt Activity Tolerance: Patient limited by pain Patient left: with call bell/phone within reach;in bed;with bed alarm set Nurse Communication: Mobility status;Patient requests pain meds PT Visit Diagnosis: Other abnormalities of gait and mobility (R26.89);Muscle weakness (generalized) (M62.81);History of falling (Z91.81);Difficulty in walking, not elsewhere classified (R26.2);Pain Pain - Right/Left: Left Pain - part of body: Hip;Leg     Time: 1020-1100 PT Time Calculation (min) (ACUTE ONLY): 40 min  Charges:  $Therapeutic Exercise: 8-22 mins $Therapeutic Activity: 23-37 mins                     Benjiman Core, [place] Pager [phone] Acute Rehab  Allena Katz 09/01/2019, 1:48 PM

## 2019-09-02 LAB — CREATININE, SERUM
Creatinine, Ser: 0.81 mg/dL (ref 0.44–1.00)
GFR calc Af Amer: >60 mL/min (ref >60)
GFR calc non Af Amer: >60 mL/min (ref >60)

## 2019-09-02 NOTE — Plan of Care (Addendum)
Pt discharging to Jaime [name], called and gave report to staff RN at 0930. Discharge instructions explained to pt/Jaime [name] (daughter in law) and pt verbalized understanding. Packed all belongings. No further questions or concerns at this time. Awaiting [facility] transportation, scheduled for 1100. Will continue to monitor.   Problem: Education: Goal: Knowledge of General Education information will improve Description: Including pain rating scale, medication(s)/side effects and non-pharmacologic comfort measures 09/02/2019 0944 by Stevan Born, RN Outcome: Completed/Met 09/02/2019 0944 by Stevan Born, RN Outcome: Progressing   Problem: Health Behavior/Discharge Planning: Goal: Ability to manage health-related needs will improve 09/02/2019 0944 by Stevan Born, RN Outcome: Completed/Met 09/02/2019 0944 by Stevan Born, RN Outcome: Progressing   Problem: Clinical Measurements: Goal: Ability to maintain clinical measurements within normal limits will improve Outcome: Completed/Met Goal: Will remain free from infection 09/02/2019 0944 by Stevan Born, RN Outcome: Completed/Met 09/02/2019 0944 by Stevan Born, RN Outcome: Progressing Goal: Diagnostic test results will improve Outcome: Completed/Met Goal: Respiratory complications will improve Outcome: Completed/Met Goal: Cardiovascular complication will be avoided Outcome: Completed/Met   Problem: Activity: Goal: Risk for activity intolerance will decrease 09/02/2019 0944 by Stevan Born, RN Outcome: Completed/Met 09/02/2019 0944 by Stevan Born, RN Outcome: Progressing   Problem: Nutrition: Goal: Adequate nutrition will be maintained 09/02/2019 0944 by Stevan Born, RN Outcome: Completed/Met 09/02/2019 0944 by Stevan Born, RN Outcome: Progressing   Problem: Coping: Goal: Level of anxiety will decrease Outcome: Completed/Met   Problem: Elimination: Goal: Will not experience complications  related to bowel motility Outcome: Completed/Met Goal: Will not experience complications related to urinary retention 09/02/2019 0944 by Stevan Born, RN Outcome: Completed/Met 09/02/2019 0944 by Stevan Born, RN Outcome: Progressing   Problem: Pain Managment: Goal: General experience of comfort will improve 09/02/2019 0944 by Stevan Born, RN Outcome: Completed/Met 09/02/2019 0944 by Stevan Born, RN Outcome: Progressing   Problem: Safety: Goal: Ability to remain free from injury will improve 09/02/2019 0944 by Stevan Born, RN Outcome: Completed/Met 09/02/2019 0944 by Stevan Born, RN Outcome: Progressing   Problem: Skin Integrity: Goal: Risk for impaired skin integrity will decrease 09/02/2019 0944 by Stevan Born, RN Outcome: Completed/Met 09/02/2019 0944 by Stevan Born, RN Outcome: Progressing   Problem: Education: Goal: Verbalization of understanding the information provided (i.e., activity precautions, restrictions, etc) will improve 09/02/2019 0944 by Stevan Born, RN Outcome: Completed/Met 09/02/2019 0944 by Stevan Born, RN Outcome: Progressing Goal: Individualized Educational Video(s) 09/02/2019 0944 by Stevan Born, RN Outcome: Completed/Met 09/02/2019 0944 by Stevan Born, RN Outcome: Progressing   Problem: Activity: Goal: Ability to ambulate and perform ADLs will improve 09/02/2019 0944 by Stevan Born, RN Outcome: Completed/Met 09/02/2019 0944 by Stevan Born, RN Outcome: Progressing   Problem: Clinical Measurements: Goal: Postoperative complications will be avoided or minimized 09/02/2019 0944 by Stevan Born, RN Outcome: Completed/Met 09/02/2019 0944 by Stevan Born, RN Outcome: Progressing   Problem: Self-Concept: Goal: Ability to maintain and perform role responsibilities to the fullest extent possible will improve Outcome: Completed/Met   Problem: Pain Management: Goal: Pain level will  decrease Outcome: Completed/Met

## 2019-09-02 NOTE — Plan of Care (Signed)

## 2019-09-02 NOTE — Discharge Summary (Signed)
Physician Discharge Summary [facility]    Patient: Jaime [name]                   Admit date: 08/26/2019   DOB: February 02, 1933             Discharge date:09/02/2019/9:10 AM SWF:[id number]                          PCP: Ezequiel Kayser, MD  Disposition:SNF  Recommendations for Outpatient Follow-up:   Follow up with your primary care provider at SNF in 3-5 Days.   Check CBC, BMP in 3-5 days.  Follow up with orthopedics (for post op follow) in 2 weeks.  Continue Lovenox for DVT prophylaxis on discharge  Discharge Condition: Stable   Code Status:   Code Status: Full Code  Diet recommendation: Cardiac diet  Not familiar with the patient. Briefly seen and evaluated the patient.  Medical records, labs, and notes were reviewed.  Discussed with the Education officer, museum.  It seems that patient was waiting for discharge to SNFyesterday, was pending SARS-CoV-2 screening -which resulted-negative now  Notes were reviewed and duplicated -agreed to discharge    Discharge Diagnoses:    Principal Problem:   Closed fracture of left distal femur ([place]) Active Problems:   Dementia with behavioral disturbance (HCC)   Hypothyroidism   Normocytic anemia   Fall at home, initial encounter   Essential hypertension   History of Present Illness/ Hospital Course Kathleen Argue Summary:   Patient is an 86 year old female with past medical history significant for dementia, hypothyroidism, hypertension, and a recent left hip surgery on 08/22/2019. She sustained a fall on 08/25/2019 at skilled nursing facility. She was brought to the emergency department at [facility] where she was found to have a distal femur periprosthetic fracture. She was subsequently transferred to [facility] for further management due to the complexity of injury. Pt underwent ORIF and removal of hardware for her distal femur fracture on 08/26/19. Largely unremarkable surgery with plan for Lovenox for DVT prophylaxis  and continued work with physical therapy and Occupational Therapy.Physical therapy has recommended skilled nursing facility placement on discharge.   Hospital Course:  Following conditions were addressed during hospitalization as listed below,  Periprosthetic fracture of the left distal femur.Status post open reduction and internal fixation with hardware removal on 08/26/2019. Physical therapy on boardand patient is weightbearing as tolerated at this time. We will continue Lovenox for DVT prophylaxis on discharge. Seen by orthopedicsduring hospitalization. Patient will need to follow-up with Orthopedic surgery as outpatient in 2 weeks.  Hypothyroidism Continue Synthroid.   Demential with behavoiral disturbance Continue supportive care.   Hyponatremia Mild.Has improved. Na of 134 on 1/14.Monitor periodically.  Acute blood loss anemia postoperative. We will consider transfusion for hemoglobin less than 7. Hemoglobin from today 7.7.No external bleeding noted. Follow up CBC in 3-5 days.  Essential hypertension Continue irbesartan on dischargeBlood pressure seems to be stable.  VTE Prophylaxis: continue Lovenox subcu  Code Status:Full code  Consultants:  Orthopedics  Disposition.  At this time, patient is stable for disposition  Medical Consultants:    Orthopedics  Procedures:    Status post ORIF with hardware removal of the left distal femur by orthopedics on 08/26/2019    Discharge Instructions:   Discharge Instructions    Call MD for:   Complete by: As directed    Worsening symptoms   Call MD for:  redness, tenderness, or signs of infection (  pain, swelling, redness, odor or green/yellow discharge around incision site)   Complete by: As directed    Call MD for:  temperature >100.4   Complete by: As directed    Diet - low sodium heart healthy   Complete by: As directed    Discharge instructions   Complete by: As directed     Follow-up with your primary care provider at SNF in 3 to 5 days. Follow up with orthopedics in 2 weeks (post op follow up). Increase fluid intake. Ambulate as per physical therapy.Okay to shower, Steri-Strips can get wet.   Increase activity slowly   Complete by: As directed    Weight bearing as tolerated       Medication List    TAKE these medications   Cholecalciferol 50 MCG (2000 UT) Caps Take 1 capsule (2,000 Units total) by mouth 2 (two) times daily. What changed: when to take this   cyanocobalamin 1000 MCG tablet Take 1,000 mcg by mouth daily.   docusate sodium 100 MG capsule Commonly known as: COLACE Take 1 capsule (100 mg total) by mouth 2 (two) times daily.   enoxaparin 40 MG/0.4ML injection Commonly known as: LOVENOX Inject 0.4 mLs (40 mg total) into the skin daily.   gabapentin 100 MG capsule Commonly known as: NEURONTIN Take 1 capsule (100 mg total) by mouth 3 (three) times daily.   HYDROcodone-acetaminophen 5-325 MG tablet Commonly known as: NORCO/VICODIN Take 1 tablet by mouth every 6 (six) hours as needed for moderate pain.   levothyroxine 137 MCG tablet Commonly known as: SYNTHROID Take 137 mcg by mouth daily before breakfast.   methocarbamol 500 MG tablet Commonly known as: ROBAXIN Take 1 tablet (500 mg total) by mouth every 6 (six) hours as needed for muscle spasms.   mirtazapine 15 MG tablet Commonly known as: REMERON Take 15 mg by mouth at bedtime.   ondansetron 4 MG tablet Commonly known as: ZOFRAN Take 1 tablet (4 mg total) by mouth every 6 (six) hours as needed for nausea.   polyethylene glycol 17 g packet Commonly known as: MiraLax Take 17 g by mouth daily.   telmisartan 80 MG tablet Commonly known as: MICARDIS Take 80 mg by mouth daily.      Follow-up Information    Haddix, Gillie Manners, MD. Schedule an appointment as soon as possible for a visit in 2 week(s).   Specialty: Orthopedic Surgery Why: for surgical followup Contact  information: [address] [place] [address] [phone]          Allergies  Allergen Reactions  . Memantine Other (See Comments)    Worsened confusion  . Donepezil Other (See Comments)    Poor appetite, weight loss     Procedures /Studies:   DG Chest 1 View  Result Date: 08/25/2019 CLINICAL DATA:  Left hip pain EXAM: CHEST  1 VIEW COMPARISON:  08/21/2019 FINDINGS: The heart size and mediastinal contours are within normal limits. No new focal airspace consolidation, pleural effusion, or pneumothorax. The visualized skeletal structures are unremarkable. IMPRESSION: No active disease. Electronically Signed   By: Duanne Guess D.O.   On: 08/25/2019 18:15   DG Chest 1 View  Result Date: 08/21/2019 CLINICAL DATA:  Fall with femur fracture EXAM: CHEST  1 VIEW COMPARISON:  04/19/2018 FINDINGS: No focal airspace disease or effusion. Stable cardiomediastinal silhouette with aortic atherosclerosis. No pneumothorax. IMPRESSION: No active disease. Electronically Signed   By: Jasmine Pang M.D.   On: 08/21/2019 20:25   DG Knee Complete  4 Views Left  Result Date: 08/25/2019 CLINICAL DATA:  Fall, left leg pain EXAM: LEFT FEMUR 2 VIEWS; LEFT KNEE - COMPLETE 4+ VIEW COMPARISON:  08/21/2019 FINDINGS: Interval ORIF of a intertrochanteric fracture of the proximal left femur with antegrade intramedullary nail, lag screw, and 2 distal interlocking screws. There is a new oblique perihardware fracture involving the distal left femoral metadiaphysis. There is approximately 1 cm of lateral and 1.5 cm of posterior displacement at the fracture site. The fracture line may extend intra-articularly to the patellofemoral compartment anteriorly. Alignment at the knee joint is maintained. Postsurgical changes within the soft tissues. Bones are demineralized. IMPRESSION: 1. New displaced obliquely oriented perihardware fracture of the distal left femoral metadiaphysis. 2. Interval ORIF of a intertrochanteric  fracture of the proximal left femur. Electronically Signed   By: Duanne GuessNicholas  Plundo D.O.   On: 08/25/2019 18:19   Left Knee  Result Date: 08/26/2019 CLINICAL DATA:  Status post fixation EXAM: PORTABLE LEFT KNEE - 1-2 VIEW COMPARISON:  None. FINDINGS: Medullary rod is noted within the left femur. A lateral fixation sideplate is noted with multiple fixation screws. The fracture fragments are in near anatomic alignment. IMPRESSION: Status post ORIF of left femoral fracture. Electronically Signed   By: Alcide CleverMark  Lukens M.D.   On: 08/26/2019 19:31   DG C-Arm 1-60 Min  Result Date: 08/26/2019 CLINICAL DATA:  Distal left femoral fracture EXAM: LEFT FEMUR 2 VIEWS; DG C-ARM 1-60 MIN COMPARISON:  08/25/2019 FLUOROSCOPY TIME:  Fluoroscopy Time:  1 minutes 26 seconds Radiation Exposure Index (if provided by the fluoroscopic device): Not available Number of Acquired Spot Images: 6 FINDINGS: Previously placed medullary rod is again identified. Oblique fracture is noted through the distal left femur. Fixation sideplate with multiple fixation screws was subsequently placed within the femur. The fracture fragments are in near anatomic alignment. IMPRESSION: Status post ORIF of distal left femoral fracture. Electronically Signed   By: Alcide CleverMark  Lukens M.D.   On: 08/26/2019 18:13   DG HIP OPERATIVE UNILAT W OR W/O PELVIS LEFT  Result Date: 08/22/2019 CLINICAL DATA:  Proximal left femur fracture. EXAM: OPERATIVE LEFT HIP (WITH PELVIS IF PERFORMED) 2 VIEWS TECHNIQUE: Fluoroscopic spot image(s) were submitted for interpretation post-operatively. COMPARISON:  RADIOGRAPHS DATED 08/21/2019 FINDINGS: AP and lateral C-arm images demonstrate the patient has undergone open reduction and internal fixation of comminuted proximal left femur fracture. Intramedullary nail and lag screw in place. Marked improvement in the alignment and position of the major fracture fragments. The lesser trochanter remains displaced. IMPRESSION: Open reduction and  internal fixation of comminuted proximal left femur fracture. Marked improvement in the alignment and position of the major fracture fragments. Electronically Signed   By: Francene BoyersJames  Maxwell M.D.   On: 08/22/2019 13:58   DG FEMUR MIN 2 VIEWS LEFT  Result Date: 08/26/2019 CLINICAL DATA:  Distal left femoral fracture EXAM: LEFT FEMUR 2 VIEWS; DG C-ARM 1-60 MIN COMPARISON:  08/25/2019 FLUOROSCOPY TIME:  Fluoroscopy Time:  1 minutes 26 seconds Radiation Exposure Index (if provided by the fluoroscopic device): Not available Number of Acquired Spot Images: 6 FINDINGS: Previously placed medullary rod is again identified. Oblique fracture is noted through the distal left femur. Fixation sideplate with multiple fixation screws was subsequently placed within the femur. The fracture fragments are in near anatomic alignment. IMPRESSION: Status post ORIF of distal left femoral fracture. Electronically Signed   By: Alcide CleverMark  Lukens M.D.   On: 08/26/2019 18:13   DG Femur Min 2 Views Left  Result Date: 08/25/2019 CLINICAL  DATA:  Fall, left leg pain EXAM: LEFT FEMUR 2 VIEWS; LEFT KNEE - COMPLETE 4+ VIEW COMPARISON:  08/21/2019 FINDINGS: Interval ORIF of a intertrochanteric fracture of the proximal left femur with antegrade intramedullary nail, lag screw, and 2 distal interlocking screws. There is a new oblique perihardware fracture involving the distal left femoral metadiaphysis. There is approximately 1 cm of lateral and 1.5 cm of posterior displacement at the fracture site. The fracture line may extend intra-articularly to the patellofemoral compartment anteriorly. Alignment at the knee joint is maintained. Postsurgical changes within the soft tissues. Bones are demineralized. IMPRESSION: 1. New displaced obliquely oriented perihardware fracture of the distal left femoral metadiaphysis. 2. Interval ORIF of a intertrochanteric fracture of the proximal left femur. Electronically Signed   By: Duanne Guess D.O.   On: 08/25/2019  18:19   DG Femur Portable Min 2 Views Left  Result Date: 08/21/2019 CLINICAL DATA:  Fall with left leg pain EXAM: LEFT FEMUR PORTABLE 2 VIEWS COMPARISON:  None. FINDINGS: Acute comminuted left intertrochanteric fracture with about 1/2 shaft diameter medial displacement of the shaft. Displaced lesser trochanteric fracture fragment. Linear fracture lucency extends to the base of the femoral neck and the fracture also involves the subtrochanteric shaft of the femur. IMPRESSION: 1. Acute comminuted left intertrochanteric fracture with about 1/2 shaft diameter medial displacement. 2. Displaced lesser trochanteric fracture fragment. Acute comminuted and displaced left intertrochanteric fracture. Electronically Signed   By: Jasmine Pang M.D.   On: 08/21/2019 20:25     Subjective:   Patient was seen and examined 09/02/2019, 9:10 AM Patient stable today. No acute distress.  No issues overnight Stable for discharge.  Discharge Exam:    Vitals:   09/01/19 1451 09/01/19 1950 09/02/19 0457 09/02/19 0736  BP: (!) 138/58 (!) 149/65 135/63 (!) 144/57  Pulse: 89 92 84 84  Resp: 16 16 14 18   Temp: 98.7 F (37.1 C) 99.4 F (37.4 C) 98.6 F (37 C) 99.2 F (37.3 C)  TempSrc: Oral Oral Oral Oral  SpO2: 98% 95% 95% 96%  Weight:      Height:        General: Pt lying comfortably in bed & appears in no obvious distress. Cardiovascular: S1 & S2 heard, RRR, S1/S2 +. No murmurs, rubs, gallops or clicks. No JVD or pedal edema. Respiratory: Clear to auscultation without wheezing, rhonchi or crackles. No increased work of breathing. Abdominal:  Non-distended, non-tender & soft. No organomegaly or masses appreciated. Normal bowel sounds heard. CNS: Alert and oriented. No focal deficits. Extremities: no edema, no cyanosis    The results of significant diagnostics from this hospitalization (including imaging, microbiology, ancillary and laboratory) are listed below for reference.      Microbiology:    Recent Results (from the past 240 hour(s))  Respiratory Panel by RT PCR (Flu A&B, Covid) - Nasopharyngeal Swab     Status: None   Collection Time: 08/25/19  6:52 PM   Specimen: Nasopharyngeal Swab  Result Value Ref Range Status   SARS Coronavirus 2 by RT PCR NEGATIVE NEGATIVE Final    Comment: (NOTE) SARS-CoV-2 target nucleic acids are NOT DETECTED. The SARS-CoV-2 RNA is generally detectable in upper respiratoy specimens during the acute phase of infection. The lowest concentration of SARS-CoV-2 viral copies this assay can detect is 131 copies/mL. A negative result does not preclude SARS-Cov-2 infection and should not be used as the sole basis for treatment or other patient management decisions. A negative result may occur with  improper specimen collection/handling,  submission of specimen other than nasopharyngeal swab, presence of viral mutation(s) within the areas targeted by this assay, and inadequate number of viral copies (<131 copies/mL). A negative result must be combined with clinical observations, patient history, and epidemiological information. The expected result is Negative. Fact Sheet for Patients:  https://www.moore.com/https://www.fda.gov/media/142436/download Fact Sheet for Healthcare Providers:  https://www.young.biz/https://www.fda.gov/media/142435/download This test is not yet ap proved or cleared by the [place] FDA and  has been authorized for detection and/or diagnosis of SARS-CoV-2 by FDA under an Emergency Use Authorization (EUA). This EUA will remain  in effect (meaning this test can be used) for the duration of the COVID-19 declaration under Section 564(b)(1) of the Act, 21 U.S.C. section 360bbb-3(b)(1), unless the authorization is terminated or revoked sooner.    Influenza A by PCR NEGATIVE NEGATIVE Final   Influenza B by PCR NEGATIVE NEGATIVE Final    Comment: (NOTE) The Xpert Xpress SARS-CoV-2/FLU/RSV assay is intended as an aid in  the diagnosis of influenza from Nasopharyngeal swab  specimens and  should not be used as a sole basis for treatment. Nasal washings and  aspirates are unacceptable for Xpert Xpress SARS-CoV-2/FLU/RSV  testing. Fact Sheet for Patients: https://www.moore.com/https://www.fda.gov/media/142436/download Fact Sheet for Healthcare Providers: https://www.young.biz/https://www.fda.gov/media/142435/download This test is not yet approved or cleared by the [place] FDA and  has been authorized for detection and/or diagnosis of SARS-CoV-2 by  FDA under an Emergency Use Authorization (EUA). This EUA will remain  in effect (meaning this test can be used) for the duration of the  Covid-19 declaration under Section 564(b)(1) of the Act, 21  U.S.C. section 360bbb-3(b)(1), unless the authorization is  terminated or revoked. Performed at [facility], [address] [place], [place] [address]   Surgical pcr screen     Status: Abnormal   Collection Time: 08/26/19  1:06 PM   Specimen: Nasal Mucosa; Nasal Swab  Result Value Ref Range Status   MRSA, PCR NEGATIVE NEGATIVE Final   Staphylococcus aureus POSITIVE (A) NEGATIVE Final    Comment: (NOTE) The Xpert SA Assay (FDA approved for NASAL specimens in patients 84 years of age and older), is one component of a comprehensive surveillance program. It is not intended to diagnose infection nor to guide or monitor treatment. Performed at [facility], [address] [place], [place] [address]   SARS CORONAVIRUS 2 (TAT 6-24 HRS) Nasopharyngeal Nasopharyngeal Swab     Status: None   Collection Time: 09/01/19  1:20 PM   Specimen: Nasopharyngeal Swab  Result Value Ref Range Status   SARS Coronavirus 2 NEGATIVE NEGATIVE Final    Comment: (NOTE) SARS-CoV-2 target nucleic acids are NOT DETECTED. The SARS-CoV-2 RNA is generally detectable in upper and lower respiratory specimens during the acute phase of infection. Negative results do not preclude SARS-CoV-2 infection, do not rule out co-infections with other pathogens, and should not  be used as the sole basis for treatment or other patient management decisions. Negative results must be combined with clinical observations, patient history, and epidemiological information. The expected result is Negative. Fact Sheet for Patients: HairSlick.nohttps://www.fda.gov/media/138098/download Fact Sheet for Healthcare Providers: quierodirigir.comhttps://www.fda.gov/media/138095/download This test is not yet approved or cleared by the [place] FDA and  has been authorized for detection and/or diagnosis of SARS-CoV-2 by FDA under an Emergency Use Authorization (EUA). This EUA will remain  in effect (meaning this test can be used) for the duration of the COVID-19 declaration under Section 56 4(b)(1) of the Act, 21 U.S.C. section 360bbb-3(b)(1), unless the authorization is  terminated or revoked sooner. Performed at [facility], [address] [place], [place] [address]      Labs:   CBC: Recent Labs  Lab 08/27/19 0431 08/28/19 0300 08/29/19 0302 08/31/19 0354 09/01/19 0541  WBC 7.5 8.3 7.5 4.5 6.1  NEUTROABS  --   --  5.1 3.2  --   HGB 8.2* 8.2* 7.8* 7.9* 7.7*  HCT 25.1* 24.0* 24.0* 23.8* 23.4*  MCV 93.7 92.3 94.1 92.6 93.6  PLT 250 265 289 284 380   Basic Metabolic Panel: Recent Labs  Lab 08/27/19 1010 08/27/19 1010 08/28/19 0300 08/29/19 0302 08/31/19 0354 09/01/19 0541 09/02/19 0514  NA 132*  --  130* 131* 134* 134*  --   K 4.0  --  4.1 4.1 4.5 4.0  --   CL 100  --  98 102 105 104  --   CO2 21*  --  22 22 23 22   --   GLUCOSE 149*  --  111* 118* 109* 109*  --   BUN 13  --  13 13 10 9   --   CREATININE 0.74   < > 0.71 0.72 0.82 0.77 0.81  CALCIUM 8.3*  --  8.1* 7.9* 8.1* 8.1*  --   MG  --   --   --  2.0 2.1 2.1  --   PHOS  --   --   --  2.9 3.7  --   --    < > = values in this interval not displayed.   Liver Function Tests: Recent Labs  Lab 08/29/19 0302 08/31/19 0354  ALBUMIN 2.1* 2.0*    Urinalysis    Component Value Date/Time   COLORURINE YELLOW (A)  04/19/2018 0728   APPEARANCEUR CLEAR (A) 04/19/2018 0728   LABSPEC 1.013 04/19/2018 0728   PHURINE 7.0 04/19/2018 0728   GLUCOSEU NEGATIVE 04/19/2018 0728   HGBUR SMALL (A) 04/19/2018 0728   BILIRUBINUR NEGATIVE 04/19/2018 0728   KETONESUR NEGATIVE 04/19/2018 0728   PROTEINUR NEGATIVE 04/19/2018 0728   NITRITE NEGATIVE 04/19/2018 0728   LEUKOCYTESUR NEGATIVE 04/19/2018 0728     Time coordinating discharge: Over 45 minutes  SIGNED: 06/19/2018, MD, FACP, FHM. [facility],  Pager [phone]  If 7PM-7AM, please contact night-coverage [phone] [facility] 09/02/2019, 9:10 AM

## 2019-09-02 NOTE — TOC Transition Note (Signed)
Transition of Care [facility]) - CM/SW Discharge Note   Patient Details  Name: Jaime [name] MRN: [id number] Date of Birth: 1932/08/28  [facility] CM/SW Contact:  Epifanio Lesches, RN Phone Number: 09/02/2019, 9:07 AM   Clinical Narrative:     Patient will DC to: [facility] Anticipated DC date: 09/02/2019 Family notified: [name] Transport by: Sharin Mons   Per MD patient ready for DC today. RN, patient, patient's family, and facility notified of DC. Discharge Summary and FL2 sent to facility. RN to call report prior to discharge [phone]). DC packet on chart. Ambulance transport requested for patient, time pick up for 11 am.  RNCM will sign off for now as intervention is no longer needed. Please consult [place] again if new needs arise.   Final next level of care: Skilled Nursing Facility Barriers to Discharge: No Barriers Identified   Patient Goals and CMS Choice        Discharge Placement                       Discharge Plan and Services                                     Social Determinants of Health (SDOH) Interventions     Readmission Risk Interventions Readmission Risk Prevention Plan 04/21/2018  Post Dischage Appt Complete  Medication Screening Complete  Transportation Screening Complete  PCP follow-up Complete  Some recent data might be hidden

## 2019-09-30 ENCOUNTER — Inpatient Hospital Stay
Admission: EM | Admit: 2019-09-30 | Discharge: 2019-10-10 | DRG: 871 | Disposition: A | Discharge status: Hospice | Payer: Medicare Other | Attending: Internal Medicine | Admitting: Internal Medicine

## 2019-09-30 ENCOUNTER — Other Ambulatory Visit: Payer: Self-pay

## 2019-09-30 ENCOUNTER — Encounter: Payer: Self-pay | Admitting: Emergency Medicine

## 2019-09-30 ENCOUNTER — Emergency Department: Payer: Medicare Other

## 2019-09-30 DIAGNOSIS — E876 Hypokalemia: Secondary | ICD-10-CM | POA: Diagnosis present

## 2019-09-30 DIAGNOSIS — R778 Other specified abnormalities of plasma proteins: Secondary | ICD-10-CM | POA: Diagnosis present

## 2019-09-30 DIAGNOSIS — I878 Other specified disorders of veins: Secondary | ICD-10-CM | POA: Diagnosis present

## 2019-09-30 DIAGNOSIS — Z7189 Other specified counseling: Secondary | ICD-10-CM

## 2019-09-30 DIAGNOSIS — I10 Essential (primary) hypertension: Secondary | ICD-10-CM | POA: Diagnosis present

## 2019-09-30 DIAGNOSIS — Z79899 Other long term (current) drug therapy: Secondary | ICD-10-CM

## 2019-09-30 DIAGNOSIS — D649 Anemia, unspecified: Secondary | ICD-10-CM | POA: Diagnosis present

## 2019-09-30 DIAGNOSIS — N390 Urinary tract infection, site not specified: Secondary | ICD-10-CM

## 2019-09-30 DIAGNOSIS — F329 Major depressive disorder, single episode, unspecified: Secondary | ICD-10-CM | POA: Diagnosis present

## 2019-09-30 DIAGNOSIS — Z90710 Acquired absence of both cervix and uterus: Secondary | ICD-10-CM

## 2019-09-30 DIAGNOSIS — G9340 Encephalopathy, unspecified: Secondary | ICD-10-CM

## 2019-09-30 DIAGNOSIS — E039 Hypothyroidism, unspecified: Secondary | ICD-10-CM | POA: Diagnosis present

## 2019-09-30 DIAGNOSIS — Z515 Encounter for palliative care: Secondary | ICD-10-CM

## 2019-09-30 DIAGNOSIS — R54 Age-related physical debility: Secondary | ICD-10-CM | POA: Diagnosis present

## 2019-09-30 DIAGNOSIS — Z6823 Body mass index (BMI) 23.0-23.9, adult: Secondary | ICD-10-CM

## 2019-09-30 DIAGNOSIS — Z66 Do not resuscitate: Secondary | ICD-10-CM | POA: Diagnosis present

## 2019-09-30 DIAGNOSIS — Z79890 Hormone replacement therapy (postmenopausal): Secondary | ICD-10-CM

## 2019-09-30 DIAGNOSIS — A419 Sepsis, unspecified organism: Secondary | ICD-10-CM | POA: Diagnosis not present

## 2019-09-30 DIAGNOSIS — L89150 Pressure ulcer of sacral region, unstageable: Secondary | ICD-10-CM | POA: Diagnosis present

## 2019-09-30 DIAGNOSIS — F0391 Unspecified dementia with behavioral disturbance: Secondary | ICD-10-CM | POA: Diagnosis present

## 2019-09-30 DIAGNOSIS — F03918 Unspecified dementia, unspecified severity, with other behavioral disturbance: Secondary | ICD-10-CM | POA: Diagnosis present

## 2019-09-30 DIAGNOSIS — E872 Acidosis: Secondary | ICD-10-CM | POA: Diagnosis present

## 2019-09-30 DIAGNOSIS — R296 Repeated falls: Secondary | ICD-10-CM | POA: Diagnosis present

## 2019-09-30 DIAGNOSIS — H9190 Unspecified hearing loss, unspecified ear: Secondary | ICD-10-CM | POA: Diagnosis present

## 2019-09-30 DIAGNOSIS — N170 Acute kidney failure with tubular necrosis: Secondary | ICD-10-CM | POA: Diagnosis present

## 2019-09-30 DIAGNOSIS — E860 Dehydration: Secondary | ICD-10-CM | POA: Diagnosis present

## 2019-09-30 DIAGNOSIS — J9811 Atelectasis: Secondary | ICD-10-CM | POA: Diagnosis present

## 2019-09-30 DIAGNOSIS — N179 Acute kidney failure, unspecified: Secondary | ICD-10-CM

## 2019-09-30 DIAGNOSIS — E875 Hyperkalemia: Secondary | ICD-10-CM | POA: Diagnosis present

## 2019-09-30 DIAGNOSIS — Z888 Allergy status to other drugs, medicaments and biological substances status: Secondary | ICD-10-CM

## 2019-09-30 DIAGNOSIS — G9341 Metabolic encephalopathy: Secondary | ICD-10-CM | POA: Diagnosis present

## 2019-09-30 DIAGNOSIS — S72402D Unspecified fracture of lower end of left femur, subsequent encounter for closed fracture with routine healing: Secondary | ICD-10-CM

## 2019-09-30 DIAGNOSIS — E43 Unspecified severe protein-calorie malnutrition: Secondary | ICD-10-CM | POA: Diagnosis present

## 2019-09-30 DIAGNOSIS — R627 Adult failure to thrive: Secondary | ICD-10-CM | POA: Diagnosis present

## 2019-09-30 DIAGNOSIS — F419 Anxiety disorder, unspecified: Secondary | ICD-10-CM | POA: Diagnosis present

## 2019-09-30 DIAGNOSIS — E870 Hyperosmolality and hypernatremia: Secondary | ICD-10-CM | POA: Diagnosis present

## 2019-09-30 DIAGNOSIS — Z20822 Contact with and (suspected) exposure to covid-19: Secondary | ICD-10-CM | POA: Diagnosis present

## 2019-09-30 DIAGNOSIS — N136 Pyonephrosis: Secondary | ICD-10-CM | POA: Diagnosis present

## 2019-09-30 DIAGNOSIS — M6282 Rhabdomyolysis: Secondary | ICD-10-CM | POA: Diagnosis present

## 2019-09-30 DIAGNOSIS — Z1619 Resistance to other specified beta lactam antibiotics: Secondary | ICD-10-CM | POA: Diagnosis present

## 2019-09-30 DIAGNOSIS — R6520 Severe sepsis without septic shock: Secondary | ICD-10-CM | POA: Diagnosis present

## 2019-09-30 DIAGNOSIS — E871 Hypo-osmolality and hyponatremia: Secondary | ICD-10-CM | POA: Diagnosis not present

## 2019-09-30 LAB — CBC WITH DIFFERENTIAL/PLATELET
Abs Immature Granulocytes: 0.34 10*3/uL — ABNORMAL HIGH (ref 0.00–0.07)
Basophils Absolute: 0.1 10*3/uL (ref 0.0–0.1)
Basophils Relative: 0 %
Eosinophils Absolute: 0 10*3/uL (ref 0.0–0.5)
Eosinophils Relative: 0 %
HCT: 35.3 % — ABNORMAL LOW (ref 36.0–46.0)
Hemoglobin: 10.7 g/dL — ABNORMAL LOW (ref 12.0–15.0)
Immature Granulocytes: 1 %
Lymphocytes Relative: 4 %
Lymphs Abs: 1 10*3/uL (ref 0.7–4.0)
MCH: 28.5 pg (ref 26.0–34.0)
MCHC: 30.3 g/dL (ref 30.0–36.0)
MCV: 94.1 fL (ref 80.0–100.0)
Monocytes Absolute: 1 10*3/uL (ref 0.1–1.0)
Monocytes Relative: 3 %
Neutro Abs: 26.6 10*3/uL — ABNORMAL HIGH (ref 1.7–7.7)
Neutrophils Relative %: 92 %
Platelets: 442 10*3/uL — ABNORMAL HIGH (ref 150–400)
RBC: 3.75 MIL/uL — ABNORMAL LOW (ref 3.87–5.11)
RDW: 15.6 % — ABNORMAL HIGH (ref 11.5–15.5)
WBC Morphology: INCREASED
WBC: 29.1 10*3/uL — ABNORMAL HIGH (ref 4.0–10.5)
nRBC: 0.1 % (ref 0.0–0.2)

## 2019-09-30 LAB — URINALYSIS, COMPLETE (UACMP) WITH MICROSCOPIC
Bilirubin Urine: NEGATIVE
Glucose, UA: NEGATIVE mg/dL
Ketones, ur: NEGATIVE mg/dL
Nitrite: NEGATIVE
Protein, ur: 100 mg/dL — AB
RBC / HPF: >50 RBC/hpf — ABNORMAL HIGH (ref 0–5)
Specific Gravity, Urine: 1.017 (ref 1.005–1.030)
WBC, UA: >50 WBC/hpf — ABNORMAL HIGH (ref 0–5)
pH: 5 (ref 5.0–8.0)

## 2019-09-30 LAB — COMPREHENSIVE METABOLIC PANEL
ALT: 12 U/L (ref 0–44)
AST: 26 U/L (ref 15–41)
Albumin: 2.3 g/dL — ABNORMAL LOW (ref 3.5–5.0)
Alkaline Phosphatase: 108 U/L (ref 38–126)
Anion gap: 22 — ABNORMAL HIGH (ref 5–15)
BUN: 207 mg/dL — ABNORMAL HIGH (ref 8–23)
CO2: 19 mmol/L — ABNORMAL LOW (ref 22–32)
Calcium: 8.9 mg/dL (ref 8.9–10.3)
Chloride: 108 mmol/L (ref 98–111)
Creatinine, Ser: 10.98 mg/dL — ABNORMAL HIGH (ref 0.44–1.00)
GFR calc Af Amer: 3 mL/min — ABNORMAL LOW (ref >60)
GFR calc non Af Amer: 3 mL/min — ABNORMAL LOW (ref >60)
Glucose, Bld: 158 mg/dL — ABNORMAL HIGH (ref 70–99)
Potassium: 5.9 mmol/L — ABNORMAL HIGH (ref 3.5–5.1)
Sodium: 149 mmol/L — ABNORMAL HIGH (ref 135–145)
Total Bilirubin: 0.8 mg/dL (ref 0.3–1.2)
Total Protein: 8.1 g/dL (ref 6.5–8.1)

## 2019-09-30 LAB — RESPIRATORY PANEL BY RT PCR (FLU A&B, COVID)
Influenza A by PCR: NEGATIVE
Influenza B by PCR: NEGATIVE
SARS Coronavirus 2 by RT PCR: NEGATIVE

## 2019-09-30 LAB — CK: Total CK: 418 U/L — ABNORMAL HIGH (ref 38–234)

## 2019-09-30 LAB — LACTIC ACID, PLASMA: Lactic Acid, Venous: 2.5 mmol/L (ref 0.5–1.9)

## 2019-09-30 LAB — TROPONIN I (HIGH SENSITIVITY): Troponin I (High Sensitivity): 131 ng/L (ref <18)

## 2019-09-30 MED ORDER — SODIUM CHLORIDE 0.9 % IV BOLUS
1000.0000 mL | Freq: Once | INTRAVENOUS | Status: AC
  Administered 2019-09-30: 1000 mL via INTRAVENOUS

## 2019-09-30 MED ORDER — FENTANYL CITRATE (PF) 100 MCG/2ML IJ SOLN
12.5000 ug | Freq: Once | INTRAMUSCULAR | Status: AC
  Administered 2019-09-30: 12.5 ug via INTRAVENOUS
  Filled 2019-09-30: qty 2

## 2019-09-30 MED ORDER — SODIUM CHLORIDE 0.9 % IV BOLUS
1000.0000 mL | Freq: Once | INTRAVENOUS | Status: AC
  Administered 2019-10-01: 1000 mL via INTRAVENOUS

## 2019-09-30 MED ORDER — SODIUM CHLORIDE 0.9 % IV SOLN
1.0000 g | Freq: Once | INTRAVENOUS | Status: AC
  Administered 2019-09-30: 1 g via INTRAVENOUS
  Filled 2019-09-30: qty 10

## 2019-09-30 MED ORDER — FENTANYL CITRATE (PF) 100 MCG/2ML IJ SOLN
12.5000 ug | Freq: Once | INTRAMUSCULAR | Status: AC
  Administered 2019-10-01: 12.5 ug via INTRAVENOUS

## 2019-09-30 NOTE — ED Provider Notes (Signed)
[facility] Emergency Department Provider Note  ____________________________________________   First MD Initiated Contact with Patient 09/30/19 2138     (approximate)  I have reviewed the triage vital signs and the nursing notes.  History  Chief Complaint Altered Mental Status    HPI Brittini Brubeck is a 86 y.o. female with history of dementia, HTN, hypothyroidism who presents to the emergency department for altered mental status above baseline.  Patient initially admitted in early January for a fall with a resultant left femoral neck fracture, for which she underwent operative intervention.  She was discharged to her living facility where she fell again with a resultant perihardware fracture that underwent operative intervention.  Discharged back to her rehab living facility after the second procedure on 1/15.  Per family, they have been unable to visit her while at the facility due to visitation restrictions.  When they picked her up today they noted she was markedly different from normal.  Not communicative, altered.  Despite her dementia, at baseline she is normally talkative, interactive, able to cloth and feed herself.  Prior to her falls, she was ambulatory independently.  Caveat: History primarily obtained from daughter-in-law at bedside due to patient's altered mental status.   Past Medical Hx Past Medical History:  Diagnosis Date  . Anxiety   . Dementia ([place])    per daughter  . Depression   . Hypertension   . Hypothyroidism     Problem List Patient Active Problem List   Diagnosis Date Noted  . Essential hypertension 08/27/2019  . Closed fracture of left distal femur ([place]) 08/26/2019  . Normocytic anemia 08/26/2019  . Fall at home, initial encounter 08/26/2019  . Closed fracture of left distal femur ([place]) 08/24/2019  . Intertrochanteric fracture ([place]) 08/21/2019  . Hypothyroidism 08/21/2019  . Protein-calorie malnutrition,  severe 04/20/2018  . Dementia with behavioral disturbance ([place]) 04/20/2018    Past Surgical Hx Past Surgical History:  Procedure Laterality Date  . ABDOMINAL HYSTERECTOMY    . appendectomy    . FEMUR IM NAIL Left 08/22/2019   Procedure: INTRAMEDULLARY (IM) NAIL FEMORAL, LEFT;  Surgeon: Lovell Sheehan, MD;  Location: [facility] ORS;  Service: Orthopedics;  Laterality: Left;  . MINOR HEMORRHOIDECTOMY    . OPEN REDUCTION INTERNAL FIXATION (ORIF) DISTAL RADIAL FRACTURE Left 09/11/2015   Procedure: OPEN REDUCTION INTERNAL FIXATION (ORIF) DISTAL RADIAL FRACTURE;  Surgeon: Hessie Knows, MD;  Location: [facility] ORS;  Service: Orthopedics;  Laterality: Left;  . ORIF FEMUR FRACTURE Left 08/26/2019   Procedure: OPEN REDUCTION INTERNAL FIXATION (ORIF) DISTAL FEMUR FRACTURE;  Surgeon: Shona Needles, MD;  Location: [place];  Service: Orthopedics;  Laterality: Left;  . TONSILLECTOMY    . TUBAL LIGATION      Medications Prior to Admission medications   Medication Sig Start Date End Date Taking? Authorizing Provider  Cholecalciferol 50 MCG (2000 UT) CAPS Take 1 capsule (2,000 Units total) by mouth 2 (two) times daily. 09/01/19   Pokhrel, Corrie Mckusick, MD  cyanocobalamin 1000 MCG tablet Take 1,000 mcg by mouth daily.     [provider]  docusate sodium (COLACE) 100 MG capsule Take 1 capsule (100 mg total) by mouth 2 (two) times daily. 09/01/19   Pokhrel, Corrie Mckusick, MD  enoxaparin (LOVENOX) 40 MG/0.4ML injection Inject 0.4 mLs (40 mg total) into the skin daily. 08/25/19 09/24/19  Dhungel, Flonnie Overman, MD  gabapentin (NEURONTIN) 100 MG capsule Take 1 capsule (100 mg total) by mouth 3 (three) times daily. 09/01/19   Pokhrel,  Laxman, MD  HYDROcodone-acetaminophen (NORCO/VICODIN) 5-325 MG tablet Take 1 tablet by mouth every 6 (six) hours as needed for moderate pain. 09/01/19   Pokhrel, Rebekah Chesterfield, MD  levothyroxine (SYNTHROID, LEVOTHROID) 137 MCG tablet Take 137 mcg by mouth daily before breakfast.    [provider]   methocarbamol (ROBAXIN) 500 MG tablet Take 1 tablet (500 mg total) by mouth every 6 (six) hours as needed for muscle spasms. 09/01/19   Pokhrel, Rebekah Chesterfield, MD  mirtazapine (REMERON) 15 MG tablet Take 15 mg by mouth at bedtime. 06/16/19   [provider]  ondansetron (ZOFRAN) 4 MG tablet Take 1 tablet (4 mg total) by mouth every 6 (six) hours as needed for nausea. 09/01/19   Pokhrel, Rebekah Chesterfield, MD  polyethylene glycol (MIRALAX) 17 g packet Take 17 g by mouth daily. 08/24/19   Dhungel, Nishant, MD  telmisartan (MICARDIS) 80 MG tablet Take 80 mg by mouth daily.    [provider]    Allergies Memantine and Donepezil  Family Hx Family History  Problem Relation Age of Onset  . Breast cancer Maternal Aunt        2 mat aunts    Social Hx Social History   Tobacco Use  . Smoking status: Never Smoker  . Smokeless tobacco: Never Used  Substance Use Topics  . Alcohol use: Not Currently  . Drug use: Never     Review of Systems Unable to obtain due to patient's altered mental status.   Physical Exam  Vital Signs: ED Triage Vitals  Enc Vitals Group     BP 09/30/19 2146 (!) 120/105     Pulse Rate 09/30/19 2146 (!) 103     Resp 09/30/19 2146 17     Temp 09/30/19 2146 97.9 F (36.6 C)     Temp Source 09/30/19 2146 Rectal     SpO2 09/30/19 2146 98 %     Weight 09/30/19 2205 156 lb 8.4 oz (71 kg)     Height 09/30/19 2205 5\' 8"  (1.727 m)     Head Circumference --      Peak Flow --      Pain Score --      Pain Loc --      Pain Edu? --      Excl. in GC? --     Constitutional: Awake. Moans to pain.  Head: Normocephalic. Atraumatic. Eyes: Conjunctivae clear. Sclera anicteric. Nose: No congestion. No rhinorrhea. Mouth/Throat: MM extremely dry.  Poor dentition. Neck: No stridor.   Cardiovascular: Tachycardic. Extremities well perfused. Respiratory: Normal respiratory effort.  Lungs CTAB. Gastrointestinal: Soft. Distended, palpable bladder, seemingly tender and groans  with palpation. Musculoskeletal: No lower extremity edema. LLE with knee immobilizer in place. Neurologic: Altered.  Moves all extremities equally. Skin: Erythema, pressure changes to the buttocks and back area.  Skin breakdown in the bilateral inguinal areas secondary to pressure from her tightened adult diaper.  Incision sites to left femur clean, dry, intact. Psychiatric: Unable to assess due to altered mental status.  EKG  Personally reviewed.   Rate: 105 Rhythm: sinus Axis: normal Intervals: WNL TWI I, aVL, V6    Radiology  CT: IMPRESSION:  1. Stable head CT, no acute process.   CXR: IMPRESSION:  Dense bandlike opacity in the right lung base likely reflecting  subsegmental atelectasis. No other acute cardiopulmonary  abnormality.    Procedures  Procedure(s) performed (including critical care):  .Critical Care Performed by: ., MD Authorized by: Miguel Aschoff., MD   Critical  care provider statement:    Critical care time (minutes):  60   Critical care was time spent personally by me on the following activities:  Discussions with consultants, evaluation of patient's response to treatment, examination of patient, ordering and performing treatments and interventions, ordering and review of laboratory studies, ordering and review of radiographic studies, pulse oximetry, re-evaluation of patient's condition, obtaining history from patient or surrogate and review of old charts     Initial Impression / Assessment and Plan / ED Course  86 y.o. female who presents to the ED for altered mental status above baseline.  Ddx: UTI, dehydration, COVID, AKI  Will evaluate with labs, imaging, urine studies, COVID swab  Given concern for patient's state of health as well as her physical exam findings, I did discuss the case with DSS Adult Protective Services regarding her care while residing at her living facility.  Daughter-in-law is aware and appreciative of  this.  Foley catheter placed with immediate return of thick, purulent fluid, concerning for likely UTI.  Will initiate empiric antibiotics.  UA overwhelmingly positive for infection.  WBC count 29.1.  Lactic acid 2.5.  Aggressive IV fluid resuscitation ongoing, especially in concern for dehydration by exam.  CMP pending.  CMP reveals severe acute renal failure, creatinine 10.98 from normal previously.  Third liter IV fluids ordered.  CK mildly elevated at 418.  Imaging negative.  COVID negative.  High-sensitivity troponin elevated, patient does have some T wave inversions in 1, aVL, but does not meet STEMI criteria, suspect troponin elevation is secondary to demand in the setting of her severe acute renal failure and UTI.  Will admit, daughter-in-law agreeable.  Discussed with hospitalist for admission.   Final Clinical Impression(s) / ED Diagnosis  Final diagnoses:  Urinary tract infection in elderly patient  Encephalopathy  Dehydration  Acute renal failure, unspecified acute renal failure type [facility])       Note:  This document was prepared using Dragon voice recognition software and may include unintentional dictation errors.   Miguel Aschoff., MD 10/01/19 [place]

## 2019-09-30 NOTE — ED Notes (Signed)
Pt has visible bruising under right breast from brassier line around her back, upper back skin is red,  left gluteus has skin tear, sacrum area mottled and bruised, redness to gluteus, inner thighs skin tear right and left side, left ankle skin tear and black spot noted to lower left leg.

## 2019-09-30 NOTE — ED Notes (Signed)
Pt to CT

## 2019-09-30 NOTE — ED Triage Notes (Signed)
Pt comes from home via EMS, per EMS pt was discharged from [facility] today and family reported pt has not been awake since she got home, pt was in Rehab for broken femur, per family pt was awake alert and oriented upon admission, family has not seen pt in 3 weeks and pt is unresponsive.

## 2019-09-30 NOTE — ED Notes (Signed)
Left arm IV infiltrated stopped IV NS fluids, Dr. Mort Sawyers aware elevated arm, and applied cold compress.

## 2019-09-30 NOTE — ED Notes (Signed)
Daughter in law at bedside

## 2019-10-01 ENCOUNTER — Inpatient Hospital Stay: Payer: Medicare Other

## 2019-10-01 DIAGNOSIS — A419 Sepsis, unspecified organism: Principal | ICD-10-CM

## 2019-10-01 DIAGNOSIS — E039 Hypothyroidism, unspecified: Secondary | ICD-10-CM | POA: Diagnosis present

## 2019-10-01 DIAGNOSIS — I10 Essential (primary) hypertension: Secondary | ICD-10-CM | POA: Diagnosis present

## 2019-10-01 DIAGNOSIS — F0391 Unspecified dementia with behavioral disturbance: Secondary | ICD-10-CM | POA: Diagnosis present

## 2019-10-01 DIAGNOSIS — F419 Anxiety disorder, unspecified: Secondary | ICD-10-CM | POA: Diagnosis present

## 2019-10-01 DIAGNOSIS — N170 Acute kidney failure with tubular necrosis: Secondary | ICD-10-CM | POA: Diagnosis present

## 2019-10-01 DIAGNOSIS — M6282 Rhabdomyolysis: Secondary | ICD-10-CM | POA: Diagnosis present

## 2019-10-01 DIAGNOSIS — N179 Acute kidney failure, unspecified: Secondary | ICD-10-CM

## 2019-10-01 DIAGNOSIS — N390 Urinary tract infection, site not specified: Secondary | ICD-10-CM | POA: Diagnosis not present

## 2019-10-01 DIAGNOSIS — Z20822 Contact with and (suspected) exposure to covid-19: Secondary | ICD-10-CM | POA: Diagnosis present

## 2019-10-01 DIAGNOSIS — Z515 Encounter for palliative care: Secondary | ICD-10-CM | POA: Diagnosis not present

## 2019-10-01 DIAGNOSIS — Z1619 Resistance to other specified beta lactam antibiotics: Secondary | ICD-10-CM | POA: Diagnosis present

## 2019-10-01 DIAGNOSIS — E860 Dehydration: Secondary | ICD-10-CM | POA: Diagnosis present

## 2019-10-01 DIAGNOSIS — E870 Hyperosmolality and hypernatremia: Secondary | ICD-10-CM

## 2019-10-01 DIAGNOSIS — Z66 Do not resuscitate: Secondary | ICD-10-CM | POA: Diagnosis present

## 2019-10-01 DIAGNOSIS — R296 Repeated falls: Secondary | ICD-10-CM | POA: Diagnosis present

## 2019-10-01 DIAGNOSIS — R627 Adult failure to thrive: Secondary | ICD-10-CM | POA: Diagnosis not present

## 2019-10-01 DIAGNOSIS — L89150 Pressure ulcer of sacral region, unstageable: Secondary | ICD-10-CM | POA: Diagnosis present

## 2019-10-01 DIAGNOSIS — E43 Unspecified severe protein-calorie malnutrition: Secondary | ICD-10-CM | POA: Diagnosis present

## 2019-10-01 DIAGNOSIS — E872 Acidosis: Secondary | ICD-10-CM | POA: Diagnosis present

## 2019-10-01 DIAGNOSIS — R339 Retention of urine, unspecified: Secondary | ICD-10-CM | POA: Diagnosis not present

## 2019-10-01 DIAGNOSIS — R54 Age-related physical debility: Secondary | ICD-10-CM | POA: Diagnosis present

## 2019-10-01 DIAGNOSIS — F329 Major depressive disorder, single episode, unspecified: Secondary | ICD-10-CM | POA: Diagnosis present

## 2019-10-01 DIAGNOSIS — G9341 Metabolic encephalopathy: Secondary | ICD-10-CM

## 2019-10-01 DIAGNOSIS — Z7189 Other specified counseling: Secondary | ICD-10-CM | POA: Diagnosis not present

## 2019-10-01 DIAGNOSIS — N132 Hydronephrosis with renal and ureteral calculous obstruction: Secondary | ICD-10-CM | POA: Diagnosis not present

## 2019-10-01 DIAGNOSIS — N136 Pyonephrosis: Secondary | ICD-10-CM | POA: Diagnosis present

## 2019-10-01 DIAGNOSIS — R6520 Severe sepsis without septic shock: Secondary | ICD-10-CM | POA: Diagnosis present

## 2019-10-01 DIAGNOSIS — J9811 Atelectasis: Secondary | ICD-10-CM | POA: Diagnosis present

## 2019-10-01 DIAGNOSIS — S72402D Unspecified fracture of lower end of left femur, subsequent encounter for closed fracture with routine healing: Secondary | ICD-10-CM | POA: Diagnosis not present

## 2019-10-01 DIAGNOSIS — E871 Hypo-osmolality and hyponatremia: Secondary | ICD-10-CM | POA: Diagnosis not present

## 2019-10-01 LAB — TROPONIN I (HIGH SENSITIVITY): Troponin I (High Sensitivity): 123 ng/L (ref <18)

## 2019-10-01 LAB — BASIC METABOLIC PANEL
Anion gap: 18 — ABNORMAL HIGH (ref 5–15)
BUN: 176 mg/dL — ABNORMAL HIGH (ref 8–23)
CO2: 13 mmol/L — ABNORMAL LOW (ref 22–32)
Calcium: 7.2 mg/dL — ABNORMAL LOW (ref 8.9–10.3)
Chloride: 120 mmol/L — ABNORMAL HIGH (ref 98–111)
Creatinine, Ser: 8.38 mg/dL — ABNORMAL HIGH (ref 0.44–1.00)
GFR calc Af Amer: 5 mL/min — ABNORMAL LOW (ref >60)
GFR calc non Af Amer: 4 mL/min — ABNORMAL LOW (ref >60)
Glucose, Bld: 142 mg/dL — ABNORMAL HIGH (ref 70–99)
Potassium: 4.5 mmol/L (ref 3.5–5.1)
Sodium: 151 mmol/L — ABNORMAL HIGH (ref 135–145)

## 2019-10-01 LAB — CBC
HCT: 33.1 % — ABNORMAL LOW (ref 36.0–46.0)
Hemoglobin: 9.7 g/dL — ABNORMAL LOW (ref 12.0–15.0)
MCH: 28.7 pg (ref 26.0–34.0)
MCHC: 29.3 g/dL — ABNORMAL LOW (ref 30.0–36.0)
MCV: 97.9 fL (ref 80.0–100.0)
Platelets: 282 10*3/uL (ref 150–400)
RBC: 3.38 MIL/uL — ABNORMAL LOW (ref 3.87–5.11)
RDW: 15.6 % — ABNORMAL HIGH (ref 11.5–15.5)
WBC: 24 10*3/uL — ABNORMAL HIGH (ref 4.0–10.5)
nRBC: 0 % (ref 0.0–0.2)

## 2019-10-01 LAB — CREATININE, URINE, RANDOM: Creatinine, Urine: 68 mg/dL

## 2019-10-01 LAB — SODIUM, URINE, RANDOM: Sodium, Ur: 123 mmol/L

## 2019-10-01 LAB — TSH: TSH: 5.443 u[IU]/mL — ABNORMAL HIGH (ref 0.350–4.500)

## 2019-10-01 LAB — LACTIC ACID, PLASMA: Lactic Acid, Venous: 1.8 mmol/L (ref 0.5–1.9)

## 2019-10-01 MED ORDER — LEVOTHYROXINE SODIUM 100 MCG/5ML IV SOLN
75.0000 ug | Freq: Every day | INTRAVENOUS | Status: DC
  Administered 2019-10-02 – 2019-10-10 (×9): 75 ug via INTRAVENOUS
  Filled 2019-10-01 (×9): qty 5

## 2019-10-01 MED ORDER — ENOXAPARIN SODIUM 40 MG/0.4ML ~~LOC~~ SOLN: 40.0000 mg | SUBCUTANEOUS | Status: DC

## 2019-10-01 MED ORDER — SODIUM CHLORIDE 0.45 % IV SOLN: INTRAVENOUS | Status: DC

## 2019-10-01 MED ORDER — ACETAMINOPHEN 650 MG RE SUPP: 650.0000 mg | Freq: Four times a day (QID) | RECTAL | Status: DC | PRN

## 2019-10-01 MED ORDER — HEPARIN SODIUM (PORCINE) 5000 UNIT/ML IJ SOLN
5000.0000 [IU] | Freq: Three times a day (TID) | INTRAMUSCULAR | Status: DC
  Administered 2019-10-01: 5000 [IU] via SUBCUTANEOUS
  Filled 2019-10-01: qty 1

## 2019-10-01 MED ORDER — ONDANSETRON HCL 4 MG PO TABS: 4.0000 mg | ORAL_TABLET | Freq: Four times a day (QID) | ORAL | Status: DC | PRN

## 2019-10-01 MED ORDER — ACETAMINOPHEN 325 MG PO TABS
650.0000 mg | ORAL_TABLET | Freq: Four times a day (QID) | ORAL | Status: DC | PRN
  Administered 2019-10-07 – 2019-10-09 (×4): 650 mg via ORAL
  Filled 2019-10-01 (×3): qty 2

## 2019-10-01 MED ORDER — ONDANSETRON HCL 4 MG/2ML IJ SOLN: 4.0000 mg | Freq: Four times a day (QID) | INTRAMUSCULAR | Status: DC | PRN

## 2019-10-01 MED ORDER — TRAZODONE HCL 50 MG PO TABS: 25.0000 mg | ORAL_TABLET | Freq: Every evening | ORAL | Status: DC | PRN

## 2019-10-01 MED ORDER — SODIUM CHLORIDE 0.9 % IV SOLN: INTRAVENOUS | Status: DC

## 2019-10-01 MED ORDER — LACTATED RINGERS IV BOLUS
1000.0000 mL | Freq: Once | INTRAVENOUS | Status: AC
  Administered 2019-10-01: 21:00:00 1000 mL via INTRAVENOUS

## 2019-10-01 MED ORDER — CHLORHEXIDINE GLUCONATE CLOTH 2 % EX PADS
6.0000 | MEDICATED_PAD | Freq: Every day | CUTANEOUS | Status: DC
  Administered 2019-10-02 – 2019-10-03 (×2): 6 via TOPICAL

## 2019-10-01 MED ORDER — MAGNESIUM HYDROXIDE 400 MG/5ML PO SUSP: 30.0000 mL | Freq: Every day | ORAL | Status: DC | PRN

## 2019-10-01 MED ORDER — MIRTAZAPINE 15 MG PO TABS: 15.0000 mg | ORAL_TABLET | Freq: Every day | ORAL | Status: DC

## 2019-10-01 MED ORDER — VITAMIN B-12 1000 MCG PO TABS
1000.0000 ug | ORAL_TABLET | Freq: Every day | ORAL | Status: DC
  Filled 2019-10-01: qty 1

## 2019-10-01 MED ORDER — SODIUM CHLORIDE 0.9 % IV BOLUS
1000.0000 mL | Freq: Once | INTRAVENOUS | Status: AC
  Administered 2019-10-01: 14:00:00 1000 mL via INTRAVENOUS

## 2019-10-01 MED ORDER — SODIUM CHLORIDE 0.9 % IV SOLN
1.0000 g | INTRAVENOUS | Status: AC
  Administered 2019-10-01 – 2019-10-04 (×4): 1 g via INTRAVENOUS
  Filled 2019-10-01: qty 1
  Filled 2019-10-01: qty 10
  Filled 2019-10-01: qty 1
  Filled 2019-10-01: qty 10

## 2019-10-01 MED ORDER — LEVOTHYROXINE SODIUM 137 MCG PO TABS
137.0000 ug | ORAL_TABLET | Freq: Every day | ORAL | Status: DC
  Filled 2019-10-01: qty 1

## 2019-10-01 MED ORDER — VITAMIN D 25 MCG (1000 UNIT) PO TABS: 2000.0000 [IU] | ORAL_TABLET | Freq: Two times a day (BID) | ORAL | Status: DC

## 2019-10-01 NOTE — ED Notes (Signed)
Patient repositioned, stool cleaned and brief replaced. Patient moans and groans to stimulus, does not clearly respond to commands but when this RN asked if she was okay, patient seemed to say "I'm okay I'm okay".   Foley draining well, fluids running at present.

## 2019-10-01 NOTE — H&P (Addendum)
[place] at [facility]   PATIENT NAME: Jaime [name]    MR#:  [id number]  DATE OF BIRTH:  [birth year]  DATE OF ADMISSION:  09/30/2019  PRIMARY CARE PHYSICIAN: Mickey Farber, MD   REQUESTING/REFERRING PHYSICIAN: Paschal Dopp MD CHIEF COMPLAINT:   Chief Complaint  Patient presents with  . Altered Mental Status    HISTORY OF PRESENT ILLNESS:  Jaime [name]  is a 86 y.o. Caucasian female with a known history of depression and anxiety, hypertension and hypothyroidism as well as dementia, presented to the emergency room with acute onset of altered mental status with decreased responsiveness.  Patient has dementia at her baseline but with talking ambulate and feeds herself and has not been doing anything today.  She was just discharged today from [facility] health rehabilitation where she has been the last 3 weeks after having a left femoral fracture and going there then unfortunately falling and having another left distal femoral fracture for which she went to [facility].  The patient was fairly obtunded and confused and therefore no history could be obtained from her.  History was obtained from her daughter who was in the ER.  She denied any reported fever or chills.  No reported nausea or vomiting abdominal discomfort.  No reported cough or wheezing or dyspnea.  He apparently had exposure to COVID-19 and was isolated there.  Upon presentation to the emergency room, blood pressure was 120/105 with a pulse of 103 and respirate of 17 with a temperature of 97.9 rectally.  Labs are remarkable for hyponatremia 149 with a potassium of 5.9 and BUN of 207 and creatinine 10.89 compared to 9/0.77 on 09/01/2019.  Lactic acid was 2.5 and CBC was unremarkable.  Influenza antigens and COVID-19 PCR came back negative.  Urinalysis was strongly positive for UTI.  CT scan showed no acute intracranial normalities.EKG showed sinus tachycardia with rate 105 with no acute abnormalities.  EKG showed sinus  tachycardia with rate of 105 with no acute abnormalities.  EKG showed sinus tachycardia with a rate of 105 with no acute abnormalities.  Chest x-ray showed dense bandlike opacity in the right lung base that reflects likely subsegmental atelectasis with no acute cardiopulmonary disease.  The patient was given IV Rocephin and 12.5 mg of IV fentanyl twice as well as 3 L bolus of IV normal saline.  She will be admitted to a medically monitored bed for further evaluation and management. PAST MEDICAL HISTORY:   Past Medical History:  Diagnosis Date  . Anxiety   . Dementia ([facility])    per daughter  . Depression   . Hypertension   . Hypothyroidism     PAST SURGICAL HISTORY:   Past Surgical History:  Procedure Laterality Date  . ABDOMINAL HYSTERECTOMY    . appendectomy    . FEMUR IM NAIL Left 08/22/2019   Procedure: INTRAMEDULLARY (IM) NAIL FEMORAL, LEFT;  Surgeon: Lyndle Herrlich, MD;  Location: [facility] ORS;  Service: Orthopedics;  Laterality: Left;  . MINOR HEMORRHOIDECTOMY    . OPEN REDUCTION INTERNAL FIXATION (ORIF) DISTAL RADIAL FRACTURE Left 09/11/2015   Procedure: OPEN REDUCTION INTERNAL FIXATION (ORIF) DISTAL RADIAL FRACTURE;  Surgeon: Kennedy Bucker, MD;  Location: [facility] ORS;  Service: Orthopedics;  Laterality: Left;  . ORIF FEMUR FRACTURE Left 08/26/2019   Procedure: OPEN REDUCTION INTERNAL FIXATION (ORIF) DISTAL FEMUR FRACTURE;  Surgeon: Roby Lofts, MD;  Location: [facility];  Service: Orthopedics;  Laterality: Left;  . TONSILLECTOMY    . TUBAL LIGATION  SOCIAL HISTORY:   Social History   Tobacco Use  . Smoking status: Never Smoker  . Smokeless tobacco: Never Used  Substance Use Topics  . Alcohol use: Not Currently    FAMILY HISTORY:   Family History  Problem Relation Age of Onset  . Breast cancer Maternal Aunt        2 mat aunts    DRUG ALLERGIES:   Allergies  Allergen Reactions  . Memantine Other (See Comments)    Worsened confusion  . Donepezil Other (See Comments)     Poor appetite, weight loss    REVIEW OF SYSTEMS:   ROS As per history of present illness. All pertinent systems were reviewed above. Constitutional,  HEENT, cardiovascular, respiratory, GI, GU, musculoskeletal, neuro, psychiatric, endocrine,  integumentary and hematologic systems were reviewed and are otherwise  negative/unremarkable except for positive findings mentioned above in the HPI.   MEDICATIONS AT HOME:   Prior to Admission medications   Medication Sig Start Date End Date Taking? Authorizing Provider  Cholecalciferol 50 MCG (2000 UT) CAPS Take 1 capsule (2,000 Units total) by mouth 2 (two) times daily. 09/01/19  Yes Pokhrel, Laxman, MD  cyanocobalamin 1000 MCG tablet Take 1,000 mcg by mouth daily.    Yes [provider]  levothyroxine (SYNTHROID, LEVOTHROID) 137 MCG tablet Take 137 mcg by mouth daily before breakfast.   Yes [provider]  mirtazapine (REMERON) 15 MG tablet Take 15 mg by mouth at bedtime. 06/16/19  Yes [provider]  telmisartan (MICARDIS) 80 MG tablet Take 80 mg by mouth daily.   Yes [provider]  docusate sodium (COLACE) 100 MG capsule Take 1 capsule (100 mg total) by mouth 2 (two) times daily. Patient not taking: Reported on 10/01/2019 09/01/19   Pokhrel, Corrie Mckusick, MD  enoxaparin (LOVENOX) 40 MG/0.4ML injection Inject 0.4 mLs (40 mg total) into the skin daily. 08/25/19 09/24/19  Dhungel, Flonnie Overman, MD  gabapentin (NEURONTIN) 100 MG capsule Take 1 capsule (100 mg total) by mouth 3 (three) times daily. Patient not taking: Reported on 10/01/2019 09/01/19   Flora Lipps, MD  HYDROcodone-acetaminophen (NORCO/VICODIN) 5-325 MG tablet Take 1 tablet by mouth every 6 (six) hours as needed for moderate pain. Patient not taking: Reported on 10/01/2019 09/01/19   Pokhrel, Corrie Mckusick, MD  methocarbamol (ROBAXIN) 500 MG tablet Take 1 tablet (500 mg total) by mouth every 6 (six) hours as needed for muscle spasms. Patient not taking: Reported on  10/01/2019 09/01/19   Pokhrel, Corrie Mckusick, MD  ondansetron (ZOFRAN) 4 MG tablet Take 1 tablet (4 mg total) by mouth every 6 (six) hours as needed for nausea. Patient not taking: Reported on 10/01/2019 09/01/19   Pokhrel, Corrie Mckusick, MD  polyethylene glycol (MIRALAX) 17 g packet Take 17 g by mouth daily. Patient not taking: Reported on 10/01/2019 08/24/19   Dhungel, Flonnie Overman, MD      VITAL SIGNS:  Blood pressure (!) 134/50, pulse (!) 103, temperature 97.9 F (36.6 C), temperature source Rectal, resp. rate (!) 23, height 5\' 8"  (1.727 m), weight 71 kg, SpO2 98 %.  PHYSICAL EXAMINATION:  Physical Exam  GENERAL:  86 y.o.-year-old Caucasian female patient lying in the bed with no acute distress.  She is globally confused and somnolent with mild restlessness. EYES: Pupils equal, round, reactive to light and accommodation. No scleral icterus. Extraocular muscles intact.  HEENT: Head atraumatic, normocephalic. Oropharynx and nasopharynx clear.   NECK:  Supple, no jugular venous distention. No thyroid enlargement, no tenderness.  LUNGS: Normal breath sounds bilaterally, no  wheezing, rales,rhonchi or crepitation. No use of accessory muscles of respiration.  CARDIOVASCULAR: Regular rate and rhythm, S1, S2 normal. No murmurs, rubs, or gallops.  ABDOMEN: Soft, nondistended, nontender. Bowel sounds present. No organomegaly or mass.  EXTREMITIES: No pedal edema, cyanosis, or clubbing.  NEUROLOGIC: Cranial nerves II through XII are intact. Muscle strength 5/5 in all extremities. Sensation intact. Gait not checked.  PSYCHIATRIC: The patient is globally confused and somnolent but arousable to painful stimuli with no purposeful movements.  Normal affect and good eye contact. SKIN: No obvious rash, lesion, or ulcer.   LABORATORY PANEL:   CBC Recent Labs  Lab 09/30/19 2209  WBC 29.1*  HGB 10.7*  HCT 35.3*  PLT 442*    ------------------------------------------------------------------------------------------------------------------  Chemistries  Recent Labs  Lab 09/30/19 2209  NA 149*  K 5.9*  CL 108  CO2 19*  GLUCOSE 158*  BUN 207*  CREATININE 10.98*  CALCIUM 8.9  AST 26  ALT 12  ALKPHOS 108  BILITOT 0.8   ------------------------------------------------------------------------------------------------------------------  Cardiac Enzymes No results for input(s): TROPONINI in the last 168 hours. ------------------------------------------------------------------------------------------------------------------  RADIOLOGY:  CT Head Wo Contrast  Result Date: 09/30/2019 CLINICAL DATA:  Somnolent, history of recent lung bone fracture EXAM: CT HEAD WITHOUT CONTRAST TECHNIQUE: Contiguous axial images were obtained from the base of the skull through the vertex without intravenous contrast. COMPARISON:  04/19/2018 FINDINGS: Brain: No acute infarct or hemorrhage. Lateral ventricles and midline structures are unremarkable. No acute extra-axial fluid collections. No mass effect. Vascular: No hyperdense vessel or unexpected calcification. Skull: Normal. Negative for fracture or focal lesion. Sinuses/Orbits: No acute finding. Other: None IMPRESSION: 1. Stable head CT, no acute process. Electronically Signed   By: Sharlet [place] M.D.   On: 09/30/2019 23:34   DG Chest Port 1 View  Result Date: 09/30/2019 CLINICAL DATA:  Altered mental status EXAM: PORTABLE CHEST 1 VIEW COMPARISON:  Radiograph 08/25/2019 FINDINGS: Dense bandlike opacity in the right lung base likely reflecting subsegmental atelectasis with associated volume loss in the right hemithorax. No consolidative opacity. No pneumothorax or effusion. The cardiomediastinal contours are stable from prior. No acute osseous or soft tissue abnormality. Degenerative changes are present in the imaged spine and shoulders. Telemetry leads overlie the chest.  IMPRESSION: Dense bandlike opacity in the right lung base likely reflecting subsegmental atelectasis. No other acute cardiopulmonary abnormality. Electronically Signed   By: Kreg Shropshire M.D.   On: 09/30/2019 22:48      IMPRESSION AND PLAN:   1.  Sepsis secondary to UTI.  Patient will be admitted to medical monitored bed.  We will continue her on IV Rocephin.  Will follow urine and blood cultures.  We will follow lactic acid.  She was hydrated with IV normal saline and we will continue with half-normal saline given her hypernatremia.  2.  Metabolic encephalopathy.  This is clearly secondary to #1.  Will monitor neuro checks every 4 hours for 24 hours.  Management otherwise as above.  3.  Acute kidney injury.  This is likely secondary to volume depletion and possibly retention.  To be hydrated with IV normal saline and will follow her BMP.  A nephrology consultation and a renal ultrasound will be obtained for further assessment.  Dr. Thedore Mins will be consulted.  I notified him about the patient.  We will avoid nephrotoxins.  4.  Hypothyroidism.  We will check TSH and continue Synthroid.  5.  DVT prophylaxis.  Subcutaneous Lovenox.  All the records are reviewed and case discussed with  ED provider. The plan of care was discussed in details with the patient (and family). I answered all questions. The patient agreed to proceed with the above mentioned plan. Further management will depend upon hospital course.   CODE STATUS: Full code  TOTAL TIME TAKING CARE OF THIS PATIENT:60 minutes.    Hannah Beat M.D on 10/01/2019 at 12:37 AM  [facility]   From 7 PM-7 AM, contact night-coverage [email or web address]  CC: Primary care physician; Mickey Farber, MD   Note: This dictation was prepared with Dragon dictation along with smaller phrase technology. Any transcriptional errors that result from this process are unintentional.

## 2019-10-01 NOTE — ED Notes (Signed)
Pt transported to CT at this time.

## 2019-10-01 NOTE — ED Notes (Signed)
Resumed care from [place], [place]. Patient currently resting, appears significantly more comfortable than earlier this evening. Patient's vital signs are stable, she is on room air at this time. Foley draining well. Patient will not be able to take oral meds at this time, so I will reach out and inform admitting MD

## 2019-10-01 NOTE — ED Notes (Addendum)
This RN to enter the room. Pt is responsive to voice. Pt does speak incomprehensible speech. V/S WNL

## 2019-10-01 NOTE — Consult Note (Signed)
Urology Consult Note  I have been asked to see the patient by Dr. Loleta Books, for evaluation and management of hydronephrosis, UTI, and urinary retention.  Chief Complaint: AMS  HPI:  Jaime [name] is a 86 y.o. year old with history notable for dementia and multiple recent falls and hospitalizations for orthopedic repair, and recent prolonged stay in rehab.  She was brought to the ER last night by family secondary to altered mental status and confusion.  I am unable to obtain any history from the patient as she remains quite altered and is not responsive to questioning.  When she initially presented, on exam she reportedly had a distended bladder and a Foley catheter was placed with return of thick purulent urine.  A renal ultrasound was performed and showed mild right hydronephrosis.  A follow-up CT without contrast was performed this morning and showed moderate hydroureteronephrosis as well as mild left hydroureteronephrosis and no definite ureteral stones, a Foley catheter was positioned appropriately within the bladder.  Labs on admission notable for AKI with creatinine of 10.98, EGFR 3 from baseline of normal with creatinine 0.81, eGFR 60 in mid January 2021, leukocytosis to 29K, hypernatremia, and hyperkalemia.  Urinalysis grossly concerning for infection with many bacteria, greater than 50 RBCs, greater than 50 WBCs, and WBC clumps.  Leukocytosis and renal function improving with Foley catheter placement and hydration.  In the ER, she is currently afebrile, blood pressure 135/80, heart rate 85.  I was able to reach her daughter Jaime [name] over the phone, and apparently when they picked her up from rehab yesterday afternoon she "never woke up" and was completely altered from her baseline mental status.  PMH: Past Medical History:  Diagnosis Date  . Anxiety   . Dementia ([place])    per daughter  . Depression   . Hypertension   . Hypothyroidism     Surgical History: Past Surgical  History:  Procedure Laterality Date  . ABDOMINAL HYSTERECTOMY    . appendectomy    . FEMUR IM NAIL Left 08/22/2019   Procedure: INTRAMEDULLARY (IM) NAIL FEMORAL, LEFT;  Surgeon: Lovell Sheehan, MD;  Location: [facility];  Service: Orthopedics;  Laterality: Left;  . MINOR HEMORRHOIDECTOMY    . OPEN REDUCTION INTERNAL FIXATION (ORIF) DISTAL RADIAL FRACTURE Left 09/11/2015   Procedure: OPEN REDUCTION INTERNAL FIXATION (ORIF) DISTAL RADIAL FRACTURE;  Surgeon: Hessie Knows, MD;  Location: [facility];  Service: Orthopedics;  Laterality: Left;  . ORIF FEMUR FRACTURE Left 08/26/2019   Procedure: OPEN REDUCTION INTERNAL FIXATION (ORIF) DISTAL FEMUR FRACTURE;  Surgeon: Shona Needles, MD;  Location: [place];  Service: Orthopedics;  Laterality: Left;  . TONSILLECTOMY    . TUBAL LIGATION      Allergies:  Allergies  Allergen Reactions  . Memantine Other (See Comments)    Worsened confusion  . Donepezil Other (See Comments)    Poor appetite, weight loss    Family History: Family History  Problem Relation Age of Onset  . Breast cancer Maternal Aunt        2 mat aunts    Social History:  reports that she has never smoked. She has never used smokeless tobacco. She reports previous alcohol use. She reports that she does not use drugs.  ROS: Unable to be obtained secondary to patient condition  Physical Exam: BP (!) 118/50   Pulse 92   Temp (!) 96.4 F (35.8 C) (Axillary)   Resp 19   Ht 5' 8" (1.727 m)  Wt 71 kg   SpO2 100%   BMI 23.80 kg/m    Constitutional: Altered, not responsive to questions, frail and cachectic appearing Cardiovascular: No clubbing, cyanosis, or edema. Respiratory: Normal respiratory effort, no increased work of breathing. GI: Abdomen is soft, nontender, nondistended, no abdominal masses Lymph: No cervical or inguinal lymphadenopathy. Skin: No rashes, bruises or suspicious lesions. Neurologic: Altered, not responsive to questions Drains: Foley with purulent  whitish-yellow urine  Laboratory Data: Reviewed, see HPI  Pertinent Imaging: I have personally reviewed the renal ultrasound and CT abdomen pelvis without contrast.  There is moderate right hydroureteronephrosis and a possible right phlebolith versus distal ureteral stone at the level of the right iliac vessels.  Per radiology read this is felt to be outside the ureteral lumen, but is difficult to discern with the quality of the CT scan.  There is also mild left hydroureteronephrosis.  The bladder is patulous and collapsed around and appropriately positioned Foley catheter.  Assessment & Plan:   In summary she is a critically ill 86 year old female with dementia and multiple falls and orthopedic surgeries over the last month who presented to the ED last night with altered mental status and confusion likely secondary to sepsis from urinary source and renal failure.  She was in urinary retention with a distended bladder on admission, and a Foley catheter was placed in the ED with return of purulent urine.  On my review of the CT, the findings are most consistent with urinary retention with resolving bilateral hydroureteronephrosis, however the calcification in the right pelvis at the level of the iliacs could represent a right distal ureteral stone but unable to definitively identify if this is within the lumen of the ureter with the quality of the CT.  This is felt to represent a phlebolith by radiology.  Her clinical status, renal function, and leukocytosis, have all improved over the last 12 hours with antibiotics, hydration, and catheter placement.  Options would include ongoing observation with antibiotics, sepsis resuscitation, and maintaining Foley versus going to the operating room for a cystoscopy, right retrograde pyelogram, possible right ureteral stent placement.  I discussed these options at length with her daughter Jaime [name], and she is in agreement for a more conservative approach with fluid  resuscitation and antibiotics which is very reasonable.  Recommendations: Agree with broad-spectrum antibiotics, fluid resuscitation, maintain Foley Consider repeat renal ultrasound or CT in the next 48 hours if not clinically improving to confirm resolution of hydronephrosis Urology will continue to follow   Billey Co, MD   [facility] [address], [place], [place] [address] [phone]

## 2019-10-01 NOTE — ED Notes (Addendum)
Report received from [place], [place]. Pt is currently sleeping, pt does seem to be mouth breathing and NAD at this time. V/S WNL.

## 2019-10-01 NOTE — Progress Notes (Signed)
Nurse calls to report hypotension.  Noted pressure 106/33. Heart rate 52, both significantly lower than those previously documented.  HPI and treatment plan reviewed.  1 liter lactated ringer bolus ordered.  Continue to monitor

## 2019-10-01 NOTE — Progress Notes (Addendum)
Chart reviewed, Pt visited. Pt is not alert enough to participate with bedside swallow eval at this time even with sternum rub. Will downgrade diet to dysphagia 1 with honey thick liquids, in the event that Pt awakens enough for PO's before ST eval. Rec. holding all trays until Pt is more alert and able to partcipate. Will evaluate swallowing once more alert.

## 2019-10-01 NOTE — ED Notes (Signed)
Attempted to feed pt applesauce to assess if pt was able to take PO medications ordered. Pt unable to eat applesauce and when straw was put to pt's mouth no sucking reflex noted. Dr. Maryfrances Bunnell, MD made aware. Will hold breakfast and morning medications at this time.

## 2019-10-01 NOTE — Progress Notes (Signed)
PROGRESS NOTE    Jaime [name]  SAY:[id number] DOB: 1933-01-28 DOA: 09/30/2019 PCP: Ezequiel Kayser, MD      Brief Narrative:  Jaime [name]  is a 86 y.o. F with depression and anxiety, hypertension and hypothyroidism as well as dementia, presented to the emergency room with acute onset of altered mental status with decreased responsiveness.  Patient has dementia at her baseline but with talking ambulate and feeds herself and has not been doing anything today.  She was just discharged today from [facility] where she has been the last 3 weeks after having a left femoral fracture and going there then unfortunately falling and having another left distal femoral fracture for which she went to [facility].  The patient was fairly obtunded and confused and therefore no history could be obtained from her.  History was obtained from her daughter who was in the ER.  She denied any reported fever or chills.  No reported nausea or vomiting abdominal discomfort.  No reported cough or wheezing or dyspnea.  He apparently had exposure to COVID-19 and was isolated there.  Upon presentation to the emergency room, blood pressure was 120/105 with a pulse of 103 and respirate of 17 with a temperature of 97.9 rectally.  Labs are remarkable for hyponatremia 149 with a potassium of 5.9 and BUN of 207 and creatinine 10.89 compared to 9/0.77 on 09/01/2019.  Lactic acid was 2.5 and CBC was unremarkable.  Influenza antigens and COVID-19 PCR came back negative.  Urinalysis was strongly positive for UTI.  CT scan showed no acute intracranial normalities.EKG showed sinus tachycardia with rate 105 with no acute abnormalities.  EKG showed sinus tachycardia with rate of 105 with no acute abnormalities.  EKG showed sinus tachycardia with a rate of 105 with no acute abnormalities.  Chest x-ray showed dense bandlike opacity in the right lung base that reflects likely subsegmental atelectasis with no acute  cardiopulmonary disease.  The patient was given IV Rocephin and 12.5 mg of IV fentanyl twice as well as 3 L bolus of IV normal saline.  She will be admitted to a medically monitored bed for further evaluation and management.   Assessment & Plan:  Acute renal failure Metabolic acidosis due to renal failure Uremia due to renal failure Renal US normal. -Continue IV fluids -Strict I/Os -Hold telmisartan -Avoid hypotension  Hydronephrosis Foley catheter placed in the ER, subsequent renal ultrasound and CT without contrast showed some unilateral hydronephrosis.  Case discussed with urology, Dr. Diamantina Providence who has graciously consulted on the patient.  Is possible there is a stone, although this was not appreciated by radiology, and if there is it is very small.  Given that she is clinically stable and improving, nothing the risks of stent placement are worth the potential benefits.  Further it would be unlikely for unilateral hydronephrosis to cause his degree of renal failure. -Consult nephrology -Monitor hemodynamics closely, stent if worsening -Continue IV antibiotics  Possible UTI with sepsis -Continue ceftriaxone -Follow blood cultures and urine culture  Hypernatremia Mild -Trend BMP while on fluids  Hyperkalemia due to renal failure K normalized -Continue IV fluids  Acute metabolic encephalopathy due to renal failure -PT eval  Possible sepsis due to UTI Lactate 2.5 on admission.  CXR with atelectasis, UA suggestive of infection. -Continue IV antibiotics -Follow blood and urine culture  Atraumatic rhabdo -Trend CK  Hypertension -Hold ARB  Dementia  Hypothyroidism -Continue levothyroxine  Mood disorder -Hold mirtazapine until improved  Recent fracture  Elevated  troponin Without chest pain, this is due to poor clearance due to renal failure, not ischemia.  No further work up necessary.  Anemia Unclear cause     Disposition: The patient was admitted with  severe renal failure.           MDM: This is a no charge note.  For further details, please see H&P by my partner Dr. Harriett Sine from earlier today.  The below labs and imaging reports were reviewed and summarized above.    DVT prophylaxis: SCDs Code Status: DNR Family Communication: Daughter by phone    Consultants:   Urology  Procedures:   US renal -- mild hydronephrosis, no stone  2/13 CT abdomen and pelvis  Antimicrobials:   Ceftriaxone   Culture data:   2/13 blood culture x2  2/13 urine culture           Subjective: Patient is very confused.  She is hemodynamically stable.        Objective: Vitals:   10/01/19 0630 10/01/19 0700 10/01/19 0800 10/01/19 0830  BP: (!) 114/45 (!) 126/51 (!) 125/49 (!) 128/47  Pulse: 90 93 90 92  Resp: (!) 22 (!) 23 20 (!) 23  Temp:      TempSrc:      SpO2: 96% 100% 99% 100%  Weight:      Height:        Intake/Output Summary (Last 24 hours) at 10/01/2019 0905 Last data filed at 10/01/2019 0345 Gross per 24 hour  Intake 3100 ml  Output 2000 ml  Net 1100 ml   Filed Weights   09/30/19 2205  Weight: 71 kg    Examination: The patient was seen and examined.      Data Reviewed: I have personally reviewed following labs and imaging studies:  CBC: Recent Labs  Lab 09/30/19 2209 10/01/19 0415  WBC 29.1* 24.0*  NEUTROABS 26.6*  --   HGB 10.7* 9.7*  HCT 35.3* 33.1*  MCV 94.1 97.9  PLT 442* 282   Basic Metabolic Panel: Recent Labs  Lab 09/30/19 2209 10/01/19 0415  NA 149* 151*  K 5.9* 4.5  CL 108 120*  CO2 19* 13*  GLUCOSE 158* 142*  BUN 207* 176*  CREATININE 10.98* 8.38*  CALCIUM 8.9 7.2*   GFR: Estimated Creatinine Clearance: 5 mL/min (A) (by C-G formula based on SCr of 8.38 mg/dL (H)). Liver Function Tests: Recent Labs  Lab 09/30/19 2209  AST 26  ALT 12  ALKPHOS 108  BILITOT 0.8  PROT 8.1  ALBUMIN 2.3*   No results for input(s): LIPASE, AMYLASE in the last 168 hours. No  results for input(s): AMMONIA in the last 168 hours. Coagulation Profile: No results for input(s): INR, PROTIME in the last 168 hours. Cardiac Enzymes: Recent Labs  Lab 09/30/19 2209  CKTOTAL 418*   BNP (last 3 results) No results for input(s): PROBNP in the last 8760 hours. HbA1C: No results for input(s): HGBA1C in the last 72 hours. CBG: No results for input(s): GLUCAP in the last 168 hours. Lipid Profile: No results for input(s): CHOL, HDL, LDLCALC, TRIG, CHOLHDL, LDLDIRECT in the last 72 hours. Thyroid Function Tests: Recent Labs    10/01/19 0415  TSH 5.443*   Anemia Panel: No results for input(s): VITAMINB12, FOLATE, FERRITIN, TIBC, IRON, RETICCTPCT in the last 72 hours. Urine analysis:    Component Value Date/Time   COLORURINE AMBER (A) 09/30/2019 2209   APPEARANCEUR TURBID (A) 09/30/2019 2209   LABSPEC 1.017 09/30/2019 2209   PHURINE 5.0  09/30/2019 2209   GLUCOSEU NEGATIVE 09/30/2019 2209   HGBUR SMALL (A) 09/30/2019 2209   BILIRUBINUR NEGATIVE 09/30/2019 2209   KETONESUR NEGATIVE 09/30/2019 2209   PROTEINUR 100 (A) 09/30/2019 2209   NITRITE NEGATIVE 09/30/2019 2209   LEUKOCYTESUR SMALL (A) 09/30/2019 2209   Sepsis Labs: @LABRCNTIP (procalcitonin:4,lacticacidven:4)  ) Recent Results (from the past 240 hour(s))  Respiratory Panel by RT PCR (Flu A&B, Covid) - Urine, Clean Catch     Status: None   Collection Time: 09/30/19 10:09 PM   Specimen: Urine, Clean Catch  Result Value Ref Range Status   SARS Coronavirus 2 by RT PCR NEGATIVE NEGATIVE Final    Comment: (NOTE) SARS-CoV-2 target nucleic acids are NOT DETECTED. The SARS-CoV-2 RNA is generally detectable in upper respiratoy specimens during the acute phase of infection. The lowest concentration of SARS-CoV-2 viral copies this assay can detect is 131 copies/mL. A negative result does not preclude SARS-Cov-2 infection and should not be used as the sole basis for treatment or other patient management  decisions. A negative result may occur with  improper specimen collection/handling, submission of specimen other than nasopharyngeal swab, presence of viral mutation(s) within the areas targeted by this assay, and inadequate number of viral copies (<131 copies/mL). A negative result must be combined with clinical observations, patient history, and epidemiological information. The expected result is Negative. Fact Sheet for Patients:  11/28/19 Fact Sheet for Healthcare Providers:  https://www.moore.com/ This test is not yet ap proved or cleared by the https://www.young.biz/ FDA and  has been authorized for detection and/or diagnosis of SARS-CoV-2 by FDA under an Emergency Use Authorization (EUA). This EUA will remain  in effect (meaning this test can be used) for the duration of the COVID-19 declaration under Section 564(b)(1) of the Act, 21 U.S.C. section 360bbb-3(b)(1), unless the authorization is terminated or revoked sooner.    Influenza A by PCR NEGATIVE NEGATIVE Final   Influenza B by PCR NEGATIVE NEGATIVE Final    Comment: (NOTE) The Xpert Xpress SARS-CoV-2/FLU/RSV assay is intended as an aid in  the diagnosis of influenza from Nasopharyngeal swab specimens and  should not be used as a sole basis for treatment. Nasal washings and  aspirates are unacceptable for Xpert Xpress SARS-CoV-2/FLU/RSV  testing. Fact Sheet for Patients: [place] Fact Sheet for Healthcare Providers: https://www.moore.com/ This test is not yet approved or cleared by the https://www.young.biz/ FDA and  has been authorized for detection and/or diagnosis of SARS-CoV-2 by  FDA under an Emergency Use Authorization (EUA). This EUA will remain  in effect (meaning this test can be used) for the duration of the  Covid-19 declaration under Section 564(b)(1) of the Act, 21  U.S.C. section 360bbb-3(b)(1), unless the authorization  is  terminated or revoked. Performed at [facility] [address] [place], [place]   Culture, blood (routine x 2)     Status: None (Preliminary result)   Collection Time: 09/30/19 10:31 PM   Specimen: Left Antecubital; Blood  Result Value Ref Range Status   Specimen Description LEFT ANTECUBITAL  Final   Special Requests Blood Culture adequate volume  Final   Culture   Final    NO GROWTH < 12 HOURS Performed at [facility], [address] [place], [place]    Report Status PENDING  Incomplete  Culture, blood (routine x 2)     Status: None (Preliminary result)   Collection Time: 09/30/19 10:36 PM   Specimen: BLOOD LEFT ARM  Result Value Ref Range  Status   Specimen Description BLOOD LEFT ARM  Final   Special Requests Blood Culture adequate volume  Final   Culture   Final    NO GROWTH < 12 HOURS Performed at [facility], [address] [place], [place] [address]    Report Status PENDING  Incomplete         Radiology Studies: CT Head Wo Contrast  Result Date: 09/30/2019 CLINICAL DATA:  Somnolent, history of recent lung bone fracture EXAM: CT HEAD WITHOUT CONTRAST TECHNIQUE: Contiguous axial images were obtained from the base of the skull through the vertex without intravenous contrast. COMPARISON:  04/19/2018 FINDINGS: Brain: No acute infarct or hemorrhage. Lateral ventricles and midline structures are unremarkable. No acute extra-axial fluid collections. No mass effect. Vascular: No hyperdense vessel or unexpected calcification. Skull: Normal. Negative for fracture or focal lesion. Sinuses/Orbits: No acute finding. Other: None IMPRESSION: 1. Stable head CT, no acute process. Electronically Signed   By: Sharlet Salina M.D.   On: 09/30/2019 23:34   US RENAL  Result Date: 10/01/2019 CLINICAL DATA:  6 year old with acute kidney injury. EXAM: RENAL / URINARY TRACT ULTRASOUND COMPLETE COMPARISON:  None. FINDINGS: Right Kidney:  Renal measurements: 10.2 x 5.1 x 5.7 cm = volume: 157 mL. Mild hydronephrosis and proximal hydroureter. Mild thinning of the renal parenchyma. No perinephric fluid collection. No suspicious mass or evident calculi. Left Kidney: Renal measurements: 10.1 x 6.0 x 5.6 cm = volume: 177 mL. Echogenicity within normal limits. No mass or hydronephrosis visualized. Bladder: Decompressed by Foley catheter. Other: Incidental layering sludge in the gallbladder. IMPRESSION: 1. Mild right hydronephrosis without identifiable cause. There is mild thinning of the right renal parenchyma which may indicate chronicity, but is nonspecific. No prior exams available for comparison. 2. Normal sonographic appearance of the left kidney. Electronically Signed   By: Narda Rutherford M.D.   On: 10/01/2019 02:56   DG Chest Port 1 View  Result Date: 09/30/2019 CLINICAL DATA:  Altered mental status EXAM: PORTABLE CHEST 1 VIEW COMPARISON:  Radiograph 08/25/2019 FINDINGS: Dense bandlike opacity in the right lung base likely reflecting subsegmental atelectasis with associated volume loss in the right hemithorax. No consolidative opacity. No pneumothorax or effusion. The cardiomediastinal contours are stable from prior. No acute osseous or soft tissue abnormality. Degenerative changes are present in the imaged spine and shoulders. Telemetry leads overlie the chest. IMPRESSION: Dense bandlike opacity in the right lung base likely reflecting subsegmental atelectasis. No other acute cardiopulmonary abnormality. Electronically Signed   By: Kreg Shropshire M.D.   On: 09/30/2019 22:48        Scheduled Meds: . cholecalciferol  2,000 Units Oral BID  . heparin  5,000 Units Subcutaneous Q8H  . levothyroxine  137 mcg Oral QAC breakfast  . mirtazapine  15 mg Oral QHS  . cyanocobalamin  1,000 mcg Oral Daily   Continuous Infusions: . sodium chloride 100 mL/hr at 10/01/19 0530  . cefTRIAXone (ROCEPHIN)  IV       LOS: 0 days    Time spent: 25  minutes    Alberteen Sam, MD [facility] 10/01/2019, 9:05 AM     Please page though AMION or Epic secure chat:  For password, contact charge nurse

## 2019-10-01 NOTE — Progress Notes (Signed)
[facility] CONSULT NOTE    Date: 10/01/2019                  Patient Name:  Jaime [name]  MRN: [id number]  DOB: October 21, 1932  Age / Sex: 86 y.o., female         PCP: Mickey Farber, MD                 Service Requesting Consult: Hospitalist                 Reason for Consult: Acute kidney injury, hydronephrosis            History of Present Illness: Patient is a 86 y.o. female with a PMHx of dementia, depression, hypertension, hypothyroidism, recent falls who was admitted to [facility] on 09/30/2019 for evaluation of altered mental status and decreased responsiveness.  She had a recent left femoral fracture and was subsequently admitted to a rehabilitation facility.  She unfortunately suffered another fracture there and then subsequently went to [facility].  Patient unable to provide any history at this point time.  She appears to have severe acute kidney injury.  Upon admission creatinine was very high at 10.9 from a baseline of 0.7.  We reviewed renal ultrasound which showed right-sided hydronephrosis.  We then ordered CT scan of the abdomen and pelvis earlier this a.m. which revealed right-sided hydronephrosis as well as hydroureter.  In addition urinary bladder contained both gas and Foley catheter balloon.  Urology was subsequently consulted.  We appreciate urology evaluation.  It appears that a conservative approach is being favored at the moment with continued IV fluid hydration as well as IV antibiotics.   Medications: Outpatient medications: (Not in a hospital admission)   Current medications: Current Facility-Administered Medications  Medication Dose Route Frequency Provider Last Rate Last Admin  . 0.45 % sodium chloride infusion   Intravenous Continuous Danford, Earl Lites, MD   Stopped at 10/01/19 1329  . acetaminophen (TYLENOL) tablet 650 mg  650 mg Oral Q6H PRN Mansy, Jan A, MD       Or  . acetaminophen (TYLENOL) suppository 650 mg   650 mg Rectal Q6H PRN Mansy, Jan A, MD      . cefTRIAXone (ROCEPHIN) 1 g in sodium chloride 0.9 % 100 mL IVPB  1 g Intravenous Q24H Mansy, Jan A, MD      . Melene Muller ON 10/02/2019] levothyroxine (SYNTHROID, LEVOTHROID) injection 75 mcg  75 mcg Intravenous QAC breakfast Danford, Earl Lites, MD      . ondansetron [facility]) tablet 4 mg  4 mg Oral Q6H PRN Mansy, Jan A, MD       Or  . ondansetron [facility]) injection 4 mg  4 mg Intravenous Q6H PRN Mansy, Vernetta Honey, MD       Current Outpatient Medications  Medication Sig Dispense Refill  . Cholecalciferol 50 MCG (2000 UT) CAPS Take 1 capsule (2,000 Units total) by mouth 2 (two) times daily.    . cyanocobalamin 1000 MCG tablet Take 1,000 mcg by mouth daily.     Marland Kitchen levothyroxine (SYNTHROID, LEVOTHROID) 137 MCG tablet Take 137 mcg by mouth daily before breakfast.    . mirtazapine (REMERON) 15 MG tablet Take 15 mg by mouth at bedtime.    Marland Kitchen telmisartan (MICARDIS) 80 MG tablet Take 80 mg by mouth daily.    Marland Kitchen docusate sodium (COLACE) 100 MG capsule Take 1 capsule (100 mg total) by mouth 2 (two) times daily. (Patient not taking:  Reported on 10/01/2019)  0  . enoxaparin (LOVENOX) 40 MG/0.4ML injection Inject 0.4 mLs (40 mg total) into the skin daily. 30 mL 0  . gabapentin (NEURONTIN) 100 MG capsule Take 1 capsule (100 mg total) by mouth 3 (three) times daily. (Patient not taking: Reported on 10/01/2019)    . HYDROcodone-acetaminophen (NORCO/VICODIN) 5-325 MG tablet Take 1 tablet by mouth every 6 (six) hours as needed for moderate pain. (Patient not taking: Reported on 10/01/2019) 12 tablet 0  . methocarbamol (ROBAXIN) 500 MG tablet Take 1 tablet (500 mg total) by mouth every 6 (six) hours as needed for muscle spasms. (Patient not taking: Reported on 10/01/2019)    . ondansetron (ZOFRAN) 4 MG tablet Take 1 tablet (4 mg total) by mouth every 6 (six) hours as needed for nausea. (Patient not taking: Reported on 10/01/2019)    . polyethylene glycol (MIRALAX) 17 g packet Take 17 g  by mouth daily. (Patient not taking: Reported on 10/01/2019) 14 each 0      Allergies: Allergies  Allergen Reactions  . Memantine Other (See Comments)    Worsened confusion  . Donepezil Other (See Comments)    Poor appetite, weight loss      Past Medical History: Past Medical History:  Diagnosis Date  . Anxiety   . Dementia ([place])    per daughter  . Depression   . Hypertension   . Hypothyroidism      Past Surgical History: Past Surgical History:  Procedure Laterality Date  . ABDOMINAL HYSTERECTOMY    . appendectomy    . FEMUR IM NAIL Left 08/22/2019   Procedure: INTRAMEDULLARY (IM) NAIL FEMORAL, LEFT;  Surgeon: Lovell Sheehan, MD;  Location: [facility] ORS;  Service: Orthopedics;  Laterality: Left;  . MINOR HEMORRHOIDECTOMY    . OPEN REDUCTION INTERNAL FIXATION (ORIF) DISTAL RADIAL FRACTURE Left 09/11/2015   Procedure: OPEN REDUCTION INTERNAL FIXATION (ORIF) DISTAL RADIAL FRACTURE;  Surgeon: Hessie Knows, MD;  Location: [facility] ORS;  Service: Orthopedics;  Laterality: Left;  . ORIF FEMUR FRACTURE Left 08/26/2019   Procedure: OPEN REDUCTION INTERNAL FIXATION (ORIF) DISTAL FEMUR FRACTURE;  Surgeon: Shona Needles, MD;  Location: [place];  Service: Orthopedics;  Laterality: Left;  . TONSILLECTOMY    . TUBAL LIGATION       Family History: Family History  Problem Relation Age of Onset  . Breast cancer Maternal Aunt        2 mat aunts     Social History: Social History   Socioeconomic History  . Marital status: Widowed    Spouse name: Not on file  . Number of children: Not on file  . Years of education: Not on file  . Highest education level: Not on file  Occupational History  . Not on file  Tobacco Use  . Smoking status: Never Smoker  . Smokeless tobacco: Never Used  Substance and Sexual Activity  . Alcohol use: Not Currently  . Drug use: Never  . Sexual activity: Not on file  Other Topics Concern  . Not on file  Social History Narrative  . Not on file   Social  Determinants of Health   Financial Resource Strain:   . Difficulty of Paying Living Expenses: Not on file  Food Insecurity:   . Worried About Charity fundraiser in the Last Year: Not on file  . Ran Out of Food in the Last Year: Not on file  Transportation Needs:   . Lack of Transportation (Medical): Not on file  .  Lack of Transportation (Non-Medical): Not on file  Physical Activity:   . Days of Exercise per Week: Not on file  . Minutes of Exercise per Session: Not on file  Stress:   . Feeling of Stress : Not on file  Social Connections:   . Frequency of Communication with Friends and Family: Not on file  . Frequency of Social Gatherings with Friends and Family: Not on file  . Attends Religious Services: Not on file  . Active Member of Clubs or Organizations: Not on file  . Attends Banker Meetings: Not on file  . Marital Status: Not on file  Intimate Partner Violence:   . Fear of Current or Ex-Partner: Not on file  . Emotionally Abused: Not on file  . Physically Abused: Not on file  . Sexually Abused: Not on file     Review of Systems: Patient unable to offer review of systems secondary to altered mental status  Vital Signs: Blood pressure (!) 131/40, pulse 86, temperature (!) 96.4 F (35.8 C), temperature source Axillary, resp. rate 17, height 5\' 8"  (1.727 m), weight 71 kg, SpO2 99 %.  Weight trends: Filed Weights   09/30/19 2205  Weight: 71 kg    Physical Exam: General:  Critically ill-appearing  Head: Normocephalic, atraumatic.  Eyes: Anicteric, EOMI  Nose: Mucous membranes dry, not inflammed, nonerythematous.  Throat: Unable to visualize pharynx but tongue and lips are very dry  Neck: Supple, trachea midline.  Lungs:  Normal respiratory effort. Clear to auscultation BL without crackles or wheezes.  Heart: RRR. S1 and S2 normal without gallop, murmur, or rubs.  Abdomen:  BS normoactive. Soft, Nondistended, non-tender.  No masses or organomegaly.   Extremities: No pretibial edema.  Neurologic: Arousable but not following commands  Skin: No visible rashes, scars.    Lab results: Basic Metabolic Panel: Recent Labs  Lab 09/30/19 2209 10/01/19 0415  NA 149* 151*  K 5.9* 4.5  CL 108 120*  CO2 19* 13*  GLUCOSE 158* 142*  BUN 207* 176*  CREATININE 10.98* 8.38*  CALCIUM 8.9 7.2*    Liver Function Tests: Recent Labs  Lab 09/30/19 2209  AST 26  ALT 12  ALKPHOS 108  BILITOT 0.8  PROT 8.1  ALBUMIN 2.3*   No results for input(s): LIPASE, AMYLASE in the last 168 hours. No results for input(s): AMMONIA in the last 168 hours.  CBC: Recent Labs  Lab 09/30/19 2209 10/01/19 0415  WBC 29.1* 24.0*  NEUTROABS 26.6*  --   HGB 10.7* 9.7*  HCT 35.3* 33.1*  MCV 94.1 97.9  PLT 442* 282    Cardiac Enzymes: Recent Labs  Lab 09/30/19 2209  CKTOTAL 418*    BNP: Invalid input(s): POCBNP  CBG: No results for input(s): GLUCAP in the last 168 hours.  Microbiology: Results for orders placed or performed during the hospital encounter of 09/30/19  Respiratory Panel by RT PCR (Flu A&B, Covid) - Urine, Clean Catch     Status: None   Collection Time: 09/30/19 10:09 PM   Specimen: Urine, Clean Catch  Result Value Ref Range Status   SARS Coronavirus 2 by RT PCR NEGATIVE NEGATIVE Final    Comment: (NOTE) SARS-CoV-2 target nucleic acids are NOT DETECTED. The SARS-CoV-2 RNA is generally detectable in upper respiratoy specimens during the acute phase of infection. The lowest concentration of SARS-CoV-2 viral copies this assay can detect is 131 copies/mL. A negative result does not preclude SARS-Cov-2 infection and should not be used as the sole  basis for treatment or other patient management decisions. A negative result may occur with  improper specimen collection/handling, submission of specimen other than nasopharyngeal swab, presence of viral mutation(s) within the areas targeted by this assay, and inadequate number of viral  copies (<131 copies/mL). A negative result must be combined with clinical observations, patient history, and epidemiological information. The expected result is Negative. Fact Sheet for Patients:  https://www.moore.com/ Fact Sheet for Healthcare Providers:  https://www.young.biz/ This test is not yet ap proved or cleared by the [place] FDA and  has been authorized for detection and/or diagnosis of SARS-CoV-2 by FDA under an Emergency Use Authorization (EUA). This EUA will remain  in effect (meaning this test can be used) for the duration of the COVID-19 declaration under Section 564(b)(1) of the Act, 21 U.S.C. section 360bbb-3(b)(1), unless the authorization is terminated or revoked sooner.    Influenza A by PCR NEGATIVE NEGATIVE Final   Influenza B by PCR NEGATIVE NEGATIVE Final    Comment: (NOTE) The Xpert Xpress SARS-CoV-2/FLU/RSV assay is intended as an aid in  the diagnosis of influenza from Nasopharyngeal swab specimens and  should not be used as a sole basis for treatment. Nasal washings and  aspirates are unacceptable for Xpert Xpress SARS-CoV-2/FLU/RSV  testing. Fact Sheet for Patients: https://www.moore.com/ Fact Sheet for Healthcare Providers: https://www.young.biz/ This test is not yet approved or cleared by the [place] FDA and  has been authorized for detection and/or diagnosis of SARS-CoV-2 by  FDA under an Emergency Use Authorization (EUA). This EUA will remain  in effect (meaning this test can be used) for the duration of the  Covid-19 declaration under Section 564(b)(1) of the Act, 21  U.S.C. section 360bbb-3(b)(1), unless the authorization is  terminated or revoked. Performed at [facility], [address] [place], [place] [address]   Culture, blood (routine x 2)     Status: None (Preliminary result)   Collection Time: 09/30/19 10:31 PM   Specimen: Left  Antecubital; Blood  Result Value Ref Range Status   Specimen Description LEFT ANTECUBITAL  Final   Special Requests Blood Culture adequate volume  Final   Culture   Final    NO GROWTH < 12 HOURS Performed at [facility], [address], [place], [place] [address]    Report Status PENDING  Incomplete  Culture, blood (routine x 2)     Status: None (Preliminary result)   Collection Time: 09/30/19 10:36 PM   Specimen: BLOOD LEFT ARM  Result Value Ref Range Status   Specimen Description BLOOD LEFT ARM  Final   Special Requests Blood Culture adequate volume  Final   Culture   Final    NO GROWTH < 12 HOURS Performed at [facility], [address], [place], [place] [address]    Report Status PENDING  Incomplete    Coagulation Studies: No results for input(s): LABPROT, INR in the last 72 hours.  Urinalysis: Recent Labs    09/30/19 2209  COLORURINE AMBER*  LABSPEC 1.017  PHURINE 5.0  GLUCOSEU NEGATIVE  HGBUR SMALL*  BILIRUBINUR NEGATIVE  KETONESUR NEGATIVE  PROTEINUR 100*  NITRITE NEGATIVE  LEUKOCYTESUR SMALL*      Imaging: CT ABDOMEN PELVIS WO CONTRAST  Result Date: 10/01/2019 CLINICAL DATA:  hydronephrosis. EXAM: CT ABDOMEN AND PELVIS WITHOUT CONTRAST TECHNIQUE: Multidetector CT imaging of the abdomen and pelvis was performed following the standard protocol without IV contrast. COMPARISON:  Ultrasound 10/01/2019 FINDINGS: Lower chest: Branching density within the right middle lobe likely  represents an area of mucous plugging within distal airway. Subsegmental atelectasis versus scar identified within both lung bases. Hepatobiliary: 9 x 4 mm low-density structure within anterior dome of liver is too small to characterize. Segment 5 low-density structure is also too small to characterize measuring 7 mm. Possible sludge within dependent portion of gallbladder. Pancreas: Unremarkable. No pancreatic ductal dilatation or surrounding inflammatory changes. Spleen:  Normal in size without focal abnormality. Adrenals/Urinary Tract: Normal appearance of the adrenal glands. Moderate right hydronephrosis and hydroureter identified corresponding to the ultrasound abnormality. No definite evidence for ureteral lithiasis identified to explain the hydronephrosis. There are several calcified scratch set multiple calcifications are identified along the course of the right ureter which are favored to represent phleboliths. The largest measures approximately 4 mm, image 63/2. A partially decompressed patulous urinary bladder is identified containing gas and a Foley catheter the. No stone identified within the bladder. Within the limitations of unenhanced technique no mass within the bladder noted. There is mild left pelvocaliectasis and hydroureter. Stomach/Bowel: Abnormal wall thickening involving the mid and distal small bowel loops identified concerning for enteritis. The single wall thickness of the terminal ileum measures up to 9 mm. No bowel obstruction. Bowel wall edema with mild surrounding inflammation is noted involving the descending colon, image 60/2. No pathologic dilatation of the colon. No pneumatosis identified. Vascular/Lymphatic: Aortic atherosclerosis identified. No aneurysm. No abdominopelvic adenopathy identified. Reproductive: Unremarkable. Other: No free fluid or fluid collections. Musculoskeletal: Osteopenia and multilevel degenerative disc disease identified within the lumbar spine. Lumbar scoliosis is convex towards the right. Hardware fixation of the proximal left femur fracture noted. IMPRESSION: 1. Moderate right-sided hydronephrosis and hydroureter identified. Mild left pelvocaliectasis and proximal hydroureter. No kidney stones or ureteral lithiasis identified. 2. Large, partially collapsed, patulous urinary bladder contains both gas as well as a Foley catheter balloon. No focal bladder calcification or mass noted (within the limitations of unenhanced  technique). 3. Suspect enteritis involving the mid and distal small bowel loops and colitis involving the descending colon. No pneumatosis, bowel perforation, or pathologic dilatation of the large or small bowel loops. Aortic Atherosclerosis (ICD10-I70.0). Electronically Signed   By: Signa Kellaylor  Stroud M.D.   On: 10/01/2019 10:44   CT Head Wo Contrast  Result Date: 09/30/2019 CLINICAL DATA:  Somnolent, history of recent lung bone fracture EXAM: CT HEAD WITHOUT CONTRAST TECHNIQUE: Contiguous axial images were obtained from the base of the skull through the vertex without intravenous contrast. COMPARISON:  04/19/2018 FINDINGS: Brain: No acute infarct or hemorrhage. Lateral ventricles and midline structures are unremarkable. No acute extra-axial fluid collections. No mass effect. Vascular: No hyperdense vessel or unexpected calcification. Skull: Normal. Negative for fracture or focal lesion. Sinuses/Orbits: No acute finding. Other: None IMPRESSION: 1. Stable head CT, no acute process. Electronically Signed   By: Sharlet SalinaMichael  Brown M.D.   On: 09/30/2019 23:34   US RENAL  Result Date: 10/01/2019 CLINICAL DATA:  89 year old with acute kidney injury. EXAM: RENAL / URINARY TRACT ULTRASOUND COMPLETE COMPARISON:  None. FINDINGS: Right Kidney: Renal measurements: 10.2 x 5.1 x 5.7 cm = volume: 157 mL. Mild hydronephrosis and proximal hydroureter. Mild thinning of the renal parenchyma. No perinephric fluid collection. No suspicious mass or evident calculi. Left Kidney: Renal measurements: 10.1 x 6.0 x 5.6 cm = volume: 177 mL. Echogenicity within normal limits. No mass or hydronephrosis visualized. Bladder: Decompressed by Foley catheter. Other: Incidental layering sludge in the gallbladder. IMPRESSION: 1. Mild right hydronephrosis without identifiable cause. There is mild thinning of the right  renal parenchyma which may indicate chronicity, but is nonspecific. No prior exams available for comparison. 2. Normal sonographic  appearance of the left kidney. Electronically Signed   By: Narda Rutherford M.D.   On: 10/01/2019 02:56   DG Chest Port 1 View  Result Date: 09/30/2019 CLINICAL DATA:  Altered mental status EXAM: PORTABLE CHEST 1 VIEW COMPARISON:  Radiograph 08/25/2019 FINDINGS: Dense bandlike opacity in the right lung base likely reflecting subsegmental atelectasis with associated volume loss in the right hemithorax. No consolidative opacity. No pneumothorax or effusion. The cardiomediastinal contours are stable from prior. No acute osseous or soft tissue abnormality. Degenerative changes are present in the imaged spine and shoulders. Telemetry leads overlie the chest. IMPRESSION: Dense bandlike opacity in the right lung base likely reflecting subsegmental atelectasis. No other acute cardiopulmonary abnormality. Electronically Signed   By: Kreg Shropshire M.D.   On: 09/30/2019 22:48      Assessment & Plan: Pt is a 86 y.o. female with a PMHx of dementia, depression, hypertension, hypothyroidism, recent falls who was admitted to [facility] on 09/30/2019 for evaluation of altered mental status and decreased responsiveness.  She had a recent left femoral fracture and was subsequently admitted to a rehabilitation facility.  She unfortunately suffered another fracture there and then subsequently went to [facility].  1.  Acute kidney injury likely secondary to ATN as well as right-sided hydronephrosis. 2.  Right-sided hydronephrosis. 3.  Hypernatremia. 4.  Altered mental status, likely multifactorial.  Plan: Patient appears to have severe acute kidney injury though there are signs of some initial improvement with IV fluid hydration.  Continue IV fluid hydration for now.  No urgent indication for dialysis at the moment as renal function does appear to be improving.  Appreciate input from urology given right-sided hydronephrosis and the possibility of right ureteral stone.  Continue half-normal saline for now and 125 cc/h.   Agree with correcting volume depletion first before focusing on hypernatremia.  May need to consider switching to D5W tomorrow once volume has been replaced.  Agree with ceftriaxone to treat underlying infection as well.  Overall prognosis guarded.

## 2019-10-01 NOTE — ED Notes (Signed)
US at bedside

## 2019-10-01 NOTE — ED Notes (Signed)
Reached out to admitting MD regarding sodium of 151, asked if fluid infusing should be changed from NS to something else

## 2019-10-02 LAB — CBC
HCT: 29.3 % — ABNORMAL LOW (ref 36.0–46.0)
Hemoglobin: 8.8 g/dL — ABNORMAL LOW (ref 12.0–15.0)
MCH: 28.8 pg (ref 26.0–34.0)
MCHC: 30 g/dL (ref 30.0–36.0)
MCV: 95.8 fL (ref 80.0–100.0)
Platelets: 227 10*3/uL (ref 150–400)
RBC: 3.06 MIL/uL — ABNORMAL LOW (ref 3.87–5.11)
RDW: 15.7 % — ABNORMAL HIGH (ref 11.5–15.5)
WBC: 18.5 10*3/uL — ABNORMAL HIGH (ref 4.0–10.5)
nRBC: 0.1 % (ref 0.0–0.2)

## 2019-10-02 LAB — RENAL FUNCTION PANEL
Albumin: 1.8 g/dL — ABNORMAL LOW (ref 3.5–5.0)
Anion gap: 13 (ref 5–15)
BUN: 129 mg/dL — ABNORMAL HIGH (ref 8–23)
CO2: 17 mmol/L — ABNORMAL LOW (ref 22–32)
Calcium: 7.8 mg/dL — ABNORMAL LOW (ref 8.9–10.3)
Chloride: 124 mmol/L — ABNORMAL HIGH (ref 98–111)
Creatinine, Ser: 3.84 mg/dL — ABNORMAL HIGH (ref 0.44–1.00)
GFR calc Af Amer: 12 mL/min — ABNORMAL LOW (ref >60)
GFR calc non Af Amer: 10 mL/min — ABNORMAL LOW (ref >60)
Glucose, Bld: 95 mg/dL (ref 70–99)
Phosphorus: 4.1 mg/dL (ref 2.5–4.6)
Potassium: 3.7 mmol/L (ref 3.5–5.1)
Sodium: 154 mmol/L — ABNORMAL HIGH (ref 135–145)

## 2019-10-02 LAB — CK: Total CK: 154 U/L (ref 38–234)

## 2019-10-02 MED ORDER — DEXTROSE 5 % IV SOLN: INTRAVENOUS | Status: DC

## 2019-10-02 NOTE — Progress Notes (Signed)
PROGRESS NOTE    Jaime [name]  OEU:[id number] DOB: 09-Oct-1932 DOA: 09/30/2019 PCP: Ezequiel Kayser, MD      Brief Narrative:  Jaime [name] is an 86 y.o. F with HTN, hypothyroidism and mild dementia home dwelling who presented with confusion and decreased responsiveness/sensorium.  Patient had recently fallen and had a proximal femur fracture, been rehabilitated, then fell again and had a distal femur fracture and being admitted at [facility].  She was in rehab for several weeks, and just discharged a few days before admission here.  Since discharge, the patient had been sleepier and less responsive than usual, until the day of admission when she was impossible to arouse.  In the ER, creatinine 10.98, BUN 207.  She was started on fluids for renal failure and admitted.          Assessment & Plan:  Acute renal failure Metabolic acidosis due to renal failure Uremia due to renal failure Hyperkalemia due to renal failure Patient had foley placed on admission, subsequent renal US normal with mild hydro.  CT abdomen showed moderate hydronephrosis.  Overnight Cr 10.9 > 8.4 > 3.8 UOP 950 cc last 24 hours Acidosis improving, K normal -Continue IV fluids -Strict I/Os -Hold telmisartan -Avoid hypotension   Right hydronephrosis Improving, agree with Urology, defer instrumentation  -Consult Urology, appreciate cares -Continue IV antibiotics -Repeat imaging tomorrow   Possible UTI with sepsis Presented with hypotension, hypothermia, leukocytosis, altered mental status, and renal failure and elevated lactic acid. WBC improving, cultures negaitve to date -Continue ceftriaxone -Follow blood cultures and urine culture  Hypernatremia Mild, worsening slightly, free water def 3.2L -Continue hypotonic fluids, change to D5 to correct at rate 0.5 mmol/L/hr -Trend BMP closely  Acute metabolic encephalopathy due to renal failure superimposed on dementia -PT eval  Atraumatic  rhabdo CK improving  -Trend CK  Hypertension BP soft -Hold ARB  Hypothyroidism -Continue levothyroxine IV  Mood disorder -Hold mirtazapine until improved  Recent fracture  Elevated troponin Without chest pain, this is due to poor clearance due to renal failure, not ischemia.  No further work up necessary.  Anemia Unclear cause, worsneing now is due to dilution.     Disposition: The patient was admitted with severe renal failure, acute metabolic encephalopathy from severe uremia, and possible infection with hydronephrosis.  Urology and nephrology have been consulted, and the patient's renal function is improving and she does not appear to have an intact infection.  She however still has a BUN greater than 100 and is severely encephalopathic, so we will continue IV fluids and supportive cares of her severe renal failure.  Prognosis remains overall dire.           MDM: The below labs and imaging reports reviewed and summarized above.  Medication management as above.  This is a severe acute illness with threat to life or bodily function.   DVT prophylaxis: SCDs Code Status: DNR Family Communication: Daughter by phone    Consultants:   Urology  Nephrology  Procedures:   US renal -- mild hydronephrosis, no stone  2/13 CT abdomen and pelvis-hydronephrosis, possible stone  Antimicrobials:   Ceftriaxone   Culture data:   2/13 blood culture x2  2/13 urine culture        BP (!) 115/46 (BP Location: Left Arm)   Pulse 85   Temp (!) 97.5 F (36.4 C) (Oral)   Resp 18   Ht 5\' 8"  (1.727 m)   Wt 71 kg   SpO2 100%  BMI 23.80 kg/m     Subjective: General appearance: Thin, frail adult female, somnolent, in no obvious distress.   HEENT: Anicteric, conjunctiva pink, lids and lashes atrophic but normal for age.  No nasal deformity, discharge, epistaxis.  Lips dry, dentition in poor repair, oropharynx dry, teeth dry and crusted, hearing impaired.   Skin: Warm and dry.  No suspicious rashes or lesions. Cardiac: Tachycardic, regular, no murmurs appreciated.  No LE edema.    Respiratory: Respiratory rate increased, but no sensory muscle use, lung sounds clear, no wheezing. Abdomen: Abdomen soft.  No grimace to palpation, no rigidity or rebound. No ascites, distension, hepatosplenomegaly.   MSK: No deformities or effusions of the large joints of the upper or lower extremities bilaterally. Neuro: Sleepy, opens eyes to touch, but does not make any intelligible verbalizations, does not follow commands.  Appears to move both upper extremities with generalized weakness but symmetric strength Psych: Attention diminished.  Judgment and insight appear impaired by dementia.         Objective: Vitals:   10/01/19 1642 10/01/19 2036 10/01/19 2220 10/02/19 0430  BP: (!) 114/45 (!) 106/33 (!) 124/45 (!) 115/46  Pulse: 88 80 85 85  Resp: 16 16  18   Temp: 97.6 F (36.4 C) (!) 97.5 F (36.4 C)  (!) 97.5 F (36.4 C)  TempSrc: Oral Axillary  Oral  SpO2: 98% 100% 100% 100%  Weight:      Height:        Intake/Output Summary (Last 24 hours) at 10/02/2019 1341 Last data filed at 10/02/2019 0900 Gross per 24 hour  Intake 3749.76 ml  Output 600 ml  Net 3149.76 ml   Filed Weights   09/30/19 2205  Weight: 71 kg    Examination:     Data Reviewed: I have personally reviewed following labs and imaging studies:  CBC: Recent Labs  Lab 09/30/19 2209 10/01/19 0415 10/02/19 0546  WBC 29.1* 24.0* 18.5*  NEUTROABS 26.6*  --   --   HGB 10.7* 9.7* 8.8*  HCT 35.3* 33.1* 29.3*  MCV 94.1 97.9 95.8  PLT 442* 282 227   Basic Metabolic Panel: Recent Labs  Lab 09/30/19 2209 10/01/19 0415 10/02/19 0546  NA 149* 151* 154*  K 5.9* 4.5 3.7  CL 108 120* 124*  CO2 19* 13* 17*  GLUCOSE 158* 142* 95  BUN 207* 176* 129*  CREATININE 10.98* 8.38* 3.84*  CALCIUM 8.9 7.2* 7.8*  PHOS  --   --  4.1   GFR: Estimated Creatinine Clearance: 10.8  mL/min (A) (by C-G formula based on SCr of 3.84 mg/dL (H)). Liver Function Tests: Recent Labs  Lab 09/30/19 2209 10/02/19 0546  AST 26  --   ALT 12  --   ALKPHOS 108  --   BILITOT 0.8  --   PROT 8.1  --   ALBUMIN 2.3* 1.8*   No results for input(s): LIPASE, AMYLASE in the last 168 hours. No results for input(s): AMMONIA in the last 168 hours. Coagulation Profile: No results for input(s): INR, PROTIME in the last 168 hours. Cardiac Enzymes: Recent Labs  Lab 09/30/19 2209 10/02/19 0546  CKTOTAL 418* 154   BNP (last 3 results) No results for input(s): PROBNP in the last 8760 hours. HbA1C: No results for input(s): HGBA1C in the last 72 hours. CBG: No results for input(s): GLUCAP in the last 168 hours. Lipid Profile: No results for input(s): CHOL, HDL, LDLCALC, TRIG, CHOLHDL, LDLDIRECT in the last 72 hours. Thyroid Function Tests: Recent Labs  10/01/19 0415  TSH 5.443*   Anemia Panel: No results for input(s): VITAMINB12, FOLATE, FERRITIN, TIBC, IRON, RETICCTPCT in the last 72 hours. Urine analysis:    Component Value Date/Time   COLORURINE AMBER (A) 09/30/2019 2209   APPEARANCEUR TURBID (A) 09/30/2019 2209   LABSPEC 1.017 09/30/2019 2209   PHURINE 5.0 09/30/2019 2209   GLUCOSEU NEGATIVE 09/30/2019 2209   HGBUR SMALL (A) 09/30/2019 2209   BILIRUBINUR NEGATIVE 09/30/2019 2209   KETONESUR NEGATIVE 09/30/2019 2209   PROTEINUR 100 (A) 09/30/2019 2209   NITRITE NEGATIVE 09/30/2019 2209   LEUKOCYTESUR SMALL (A) 09/30/2019 2209   Sepsis Labs: @LABRCNTIP (procalcitonin:4,lacticacidven:4)  ) Recent Results (from the past 240 hour(s))  Urine culture     Status: Abnormal (Preliminary result)   Collection Time: 09/30/19 10:09 PM   Specimen: Urine, Random  Result Value Ref Range Status   Specimen Description   Final    URINE, RANDOM Performed at [facility], [address], [place], [place]    Special Requests   Final    NONE Performed at  [facility], [address] [place], [place]    Culture (A)  Final    80,000 COLONIES/mL GRAM NEGATIVE RODS IDENTIFICATION AND SUSCEPTIBILITIES TO FOLLOW Performed at [facility], [address], [place], [place]    Report Status PENDING  Incomplete  Respiratory Panel by RT PCR (Flu A&B, Covid) - Urine, Clean Catch     Status: None   Collection Time: 09/30/19 10:09 PM   Specimen: Urine, Clean Catch  Result Value Ref Range Status   SARS Coronavirus 2 by RT PCR NEGATIVE NEGATIVE Final    Comment: (NOTE) SARS-CoV-2 target nucleic acids are NOT DETECTED. The SARS-CoV-2 RNA is generally detectable in upper respiratoy specimens during the acute phase of infection. The lowest concentration of SARS-CoV-2 viral copies this assay can detect is 131 copies/mL. A negative result does not preclude SARS-Cov-2 infection and should not be used as the sole basis for treatment or other patient management decisions. A negative result may occur with  improper specimen collection/handling, submission of specimen other than nasopharyngeal swab, presence of viral mutation(s) within the areas targeted by this assay, and inadequate number of viral copies (<131 copies/mL). A negative result must be combined with clinical observations, patient history, and epidemiological information. The expected result is Negative. Fact Sheet for Patients:  11/28/19 Fact Sheet for Healthcare Providers:  https://www.moore.com/ This test is not yet ap proved or cleared by the https://www.young.biz/ FDA and  has been authorized for detection and/or diagnosis of SARS-CoV-2 by FDA under an Emergency Use Authorization (EUA). This EUA will remain  in effect (meaning this test can be used) for the duration of the COVID-19 declaration under Section 564(b)(1) of the Act, 21 U.S.C. section 360bbb-3(b)(1), unless the authorization is terminated  or revoked sooner.    Influenza A by PCR NEGATIVE NEGATIVE Final   Influenza B by PCR NEGATIVE NEGATIVE Final    Comment: (NOTE) The Xpert Xpress SARS-CoV-2/FLU/RSV assay is intended as an aid in  the diagnosis of influenza from Nasopharyngeal swab specimens and  should not be used as a sole basis for treatment. Nasal washings and  aspirates are unacceptable for Xpert Xpress SARS-CoV-2/FLU/RSV  testing. Fact Sheet for Patients: [place] Fact Sheet for Healthcare Providers: https://www.moore.com/ This test is not yet approved or cleared by the https://www.young.biz/ FDA and  has been authorized for detection and/or diagnosis of SARS-CoV-2 by  FDA under an  Emergency Use Authorization (EUA). This EUA will remain  in effect (meaning this test can be used) for the duration of the  Covid-19 declaration under Section 564(b)(1) of the Act, 21  U.S.C. section 360bbb-3(b)(1), unless the authorization is  terminated or revoked. Performed at [facility], [address] [place], [place] [address]   Culture, blood (routine x 2)     Status: None (Preliminary result)   Collection Time: 09/30/19 10:31 PM   Specimen: Left Antecubital; Blood  Result Value Ref Range Status   Specimen Description LEFT ANTECUBITAL  Final   Special Requests Blood Culture adequate volume  Final   Culture   Final    NO GROWTH 2 DAYS Performed at [facility], [address] [place], [place] [address]    Report Status PENDING  Incomplete  Culture, blood (routine x 2)     Status: None (Preliminary result)   Collection Time: 09/30/19 10:36 PM   Specimen: BLOOD LEFT ARM  Result Value Ref Range Status   Specimen Description BLOOD LEFT ARM  Final   Special Requests Blood Culture adequate volume  Final   Culture   Final    NO GROWTH 2 DAYS Performed at [facility], [address], [place], [place] [address]    Report Status PENDING  Incomplete          Radiology Studies: CT ABDOMEN PELVIS WO CONTRAST  Result Date: 10/01/2019 CLINICAL DATA:  hydronephrosis. EXAM: CT ABDOMEN AND PELVIS WITHOUT CONTRAST TECHNIQUE: Multidetector CT imaging of the abdomen and pelvis was performed following the standard protocol without IV contrast. COMPARISON:  Ultrasound 10/01/2019 FINDINGS: Lower chest: Branching density within the right middle lobe likely represents an area of mucous plugging within distal airway. Subsegmental atelectasis versus scar identified within both lung bases. Hepatobiliary: 9 x 4 mm low-density structure within anterior dome of liver is too small to characterize. Segment 5 low-density structure is also too small to characterize measuring 7 mm. Possible sludge within dependent portion of gallbladder. Pancreas: Unremarkable. No pancreatic ductal dilatation or surrounding inflammatory changes. Spleen: Normal in size without focal abnormality. Adrenals/Urinary Tract: Normal appearance of the adrenal glands. Moderate right hydronephrosis and hydroureter identified corresponding to the ultrasound abnormality. No definite evidence for ureteral lithiasis identified to explain the hydronephrosis. There are several calcified scratch set multiple calcifications are identified along the course of the right ureter which are favored to represent phleboliths. The largest measures approximately 4 mm, image 63/2. A partially decompressed patulous urinary bladder is identified containing gas and a Foley catheter the. No stone identified within the bladder. Within the limitations of unenhanced technique no mass within the bladder noted. There is mild left pelvocaliectasis and hydroureter. Stomach/Bowel: Abnormal wall thickening involving the mid and distal small bowel loops identified concerning for enteritis. The single wall thickness of the terminal ileum measures up to 9 mm. No bowel obstruction. Bowel wall edema with mild surrounding inflammation is  noted involving the descending colon, image 60/2. No pathologic dilatation of the colon. No pneumatosis identified. Vascular/Lymphatic: Aortic atherosclerosis identified. No aneurysm. No abdominopelvic adenopathy identified. Reproductive: Unremarkable. Other: No free fluid or fluid collections. Musculoskeletal: Osteopenia and multilevel degenerative disc disease identified within the lumbar spine. Lumbar scoliosis is convex towards the right. Hardware fixation of the proximal left femur fracture noted. IMPRESSION: 1. Moderate right-sided hydronephrosis and hydroureter identified. Mild left pelvocaliectasis and proximal hydroureter. No kidney stones or ureteral lithiasis identified. 2. Large, partially collapsed, patulous urinary bladder contains both gas as well  as a Foley catheter balloon. No focal bladder calcification or mass noted (within the limitations of unenhanced technique). 3. Suspect enteritis involving the mid and distal small bowel loops and colitis involving the descending colon. No pneumatosis, bowel perforation, or pathologic dilatation of the large or small bowel loops. Aortic Atherosclerosis (ICD10-I70.0). Electronically Signed   By: Signa Kell M.D.   On: 10/01/2019 10:44   CT Head Wo Contrast  Result Date: 09/30/2019 CLINICAL DATA:  Somnolent, history of recent lung bone fracture EXAM: CT HEAD WITHOUT CONTRAST TECHNIQUE: Contiguous axial images were obtained from the base of the skull through the vertex without intravenous contrast. COMPARISON:  04/19/2018 FINDINGS: Brain: No acute infarct or hemorrhage. Lateral ventricles and midline structures are unremarkable. No acute extra-axial fluid collections. No mass effect. Vascular: No hyperdense vessel or unexpected calcification. Skull: Normal. Negative for fracture or focal lesion. Sinuses/Orbits: No acute finding. Other: None IMPRESSION: 1. Stable head CT, no acute process. Electronically Signed   By: Sharlet Salina M.D.   On: 09/30/2019  23:34   US RENAL  Result Date: 10/01/2019 CLINICAL DATA:  47 year old with acute kidney injury. EXAM: RENAL / URINARY TRACT ULTRASOUND COMPLETE COMPARISON:  None. FINDINGS: Right Kidney: Renal measurements: 10.2 x 5.1 x 5.7 cm = volume: 157 mL. Mild hydronephrosis and proximal hydroureter. Mild thinning of the renal parenchyma. No perinephric fluid collection. No suspicious mass or evident calculi. Left Kidney: Renal measurements: 10.1 x 6.0 x 5.6 cm = volume: 177 mL. Echogenicity within normal limits. No mass or hydronephrosis visualized. Bladder: Decompressed by Foley catheter. Other: Incidental layering sludge in the gallbladder. IMPRESSION: 1. Mild right hydronephrosis without identifiable cause. There is mild thinning of the right renal parenchyma which may indicate chronicity, but is nonspecific. No prior exams available for comparison. 2. Normal sonographic appearance of the left kidney. Electronically Signed   By: Narda Rutherford M.D.   On: 10/01/2019 02:56   DG Chest Port 1 View  Result Date: 09/30/2019 CLINICAL DATA:  Altered mental status EXAM: PORTABLE CHEST 1 VIEW COMPARISON:  Radiograph 08/25/2019 FINDINGS: Dense bandlike opacity in the right lung base likely reflecting subsegmental atelectasis with associated volume loss in the right hemithorax. No consolidative opacity. No pneumothorax or effusion. The cardiomediastinal contours are stable from prior. No acute osseous or soft tissue abnormality. Degenerative changes are present in the imaged spine and shoulders. Telemetry leads overlie the chest. IMPRESSION: Dense bandlike opacity in the right lung base likely reflecting subsegmental atelectasis. No other acute cardiopulmonary abnormality. Electronically Signed   By: Kreg Shropshire M.D.   On: 09/30/2019 22:48        Scheduled Meds: . Chlorhexidine Gluconate Cloth  6 each Topical Daily  . levothyroxine  75 mcg Intravenous QAC breakfast   Continuous Infusions: . cefTRIAXone  (ROCEPHIN)  IV Stopped (10/01/19 2216)  . dextrose 125 mL/hr at 10/02/19 0922     LOS: 1 day    Time spent: 35 minutes    Alberteen Sam, MD [facility] 10/02/2019, 1:41 PM     Please page though AMION or Epic secure chat:  For password, contact charge nurse

## 2019-10-02 NOTE — Progress Notes (Signed)
[facility]  ROUNDING NOTE   Subjective:  Renal parameters have improved a bit though still quite high. BUN 129 with a creatinine of 3.84. Patient is producing urine.   Objective:  Vital signs in last 24 hours:  Temp:  [97.5 F (36.4 C)-97.6 F (36.4 C)] 97.5 F (36.4 C) (02/14 0430) Pulse Rate:  [80-88] 85 (02/14 0430) Resp:  [16-18] 18 (02/14 0430) BP: (106-124)/(33-46) 115/46 (02/14 0430) SpO2:  [98 %-100 %] 100 % (02/14 0430)  Weight change:  Filed Weights   09/30/19 2205  Weight: 71 kg    Intake/Output: I/O last 3 completed shifts: In: 6729.8 [I.V.:2523.3; IV Piggyback:4206.4] Out: 2950 [Urine:2950]   Intake/Output this shift:  Total I/O In: 120 [P.O.:120] Out: -   Physical Exam: General: No acute distress  Head: Normocephalic, atraumatic. Moist oral mucosal membranes  Eyes: Anicteric  Neck: Supple, trachea midline  Lungs:  Clear to auscultation, normal effort  Heart: S1S2 no rubs  Abdomen:  Soft, nontender, bowel sounds present  Extremities: No peripheral edema.  Neurologic:  Arousable, not consistently following commands  Skin: No lesions       Basic Metabolic Panel: Recent Labs  Lab 09/30/19 2209 10/01/19 0415 10/02/19 0546  NA 149* 151* 154*  K 5.9* 4.5 3.7  CL 108 120* 124*  CO2 19* 13* 17*  GLUCOSE 158* 142* 95  BUN 207* 176* 129*  CREATININE 10.98* 8.38* 3.84*  CALCIUM 8.9 7.2* 7.8*  PHOS  --   --  4.1    Liver Function Tests: Recent Labs  Lab 09/30/19 2209 10/02/19 0546  AST 26  --   ALT 12  --   ALKPHOS 108  --   BILITOT 0.8  --   PROT 8.1  --   ALBUMIN 2.3* 1.8*   No results for input(s): LIPASE, AMYLASE in the last 168 hours. No results for input(s): AMMONIA in the last 168 hours.  CBC: Recent Labs  Lab 09/30/19 2209 10/01/19 0415 10/02/19 0546  WBC 29.1* 24.0* 18.5*  NEUTROABS 26.6*  --   --   HGB 10.7* 9.7* 8.8*  HCT 35.3* 33.1* 29.3*  MCV 94.1 97.9 95.8  PLT 442* 282 227    Cardiac  Enzymes: Recent Labs  Lab 09/30/19 2209 10/02/19 0546  CKTOTAL 418* 154    BNP: Invalid input(s): POCBNP  CBG: No results for input(s): GLUCAP in the last 168 hours.  Microbiology: Results for orders placed or performed during the hospital encounter of 09/30/19  Urine culture     Status: Abnormal (Preliminary result)   Collection Time: 09/30/19 10:09 PM   Specimen: Urine, Random  Result Value Ref Range Status   Specimen Description   Final    URINE, RANDOM Performed at [facility], [address] [place], [place] [address]    Special Requests   Final    NONE Performed at [facility], [address] [place], [place] [address]    Culture (A)  Final    80,000 COLONIES/mL GRAM NEGATIVE RODS IDENTIFICATION AND SUSCEPTIBILITIES TO FOLLOW Performed at [facility], [place] [address], [place], [place] [address]    Report Status PENDING  Incomplete  Respiratory Panel by RT PCR (Flu A&B, Covid) - Urine, Clean Catch     Status: None   Collection Time: 09/30/19 10:09 PM   Specimen: Urine, Clean Catch  Result Value Ref Range Status   SARS Coronavirus 2 by RT PCR NEGATIVE NEGATIVE Final    Comment: (NOTE) SARS-CoV-2 target  nucleic acids are NOT DETECTED. The SARS-CoV-2 RNA is generally detectable in upper respiratoy specimens during the acute phase of infection. The lowest concentration of SARS-CoV-2 viral copies this assay can detect is 131 copies/mL. A negative result does not preclude SARS-Cov-2 infection and should not be used as the sole basis for treatment or other patient management decisions. A negative result may occur with  improper specimen collection/handling, submission of specimen other than nasopharyngeal swab, presence of viral mutation(s) within the areas targeted by this assay, and inadequate number of viral copies (<131 copies/mL). A negative result must be combined with clinical observations, patient history, and epidemiological  information. The expected result is Negative. Fact Sheet for Patients:  https://www.moore.com/ Fact Sheet for Healthcare Providers:  https://www.young.biz/ This test is not yet ap proved or cleared by the [place] FDA and  has been authorized for detection and/or diagnosis of SARS-CoV-2 by FDA under an Emergency Use Authorization (EUA). This EUA will remain  in effect (meaning this test can be used) for the duration of the COVID-19 declaration under Section 564(b)(1) of the Act, 21 U.S.C. section 360bbb-3(b)(1), unless the authorization is terminated or revoked sooner.    Influenza A by PCR NEGATIVE NEGATIVE Final   Influenza B by PCR NEGATIVE NEGATIVE Final    Comment: (NOTE) The Xpert Xpress SARS-CoV-2/FLU/RSV assay is intended as an aid in  the diagnosis of influenza from Nasopharyngeal swab specimens and  should not be used as a sole basis for treatment. Nasal washings and  aspirates are unacceptable for Xpert Xpress SARS-CoV-2/FLU/RSV  testing. Fact Sheet for Patients: https://www.moore.com/ Fact Sheet for Healthcare Providers: https://www.young.biz/ This test is not yet approved or cleared by the [place] FDA and  has been authorized for detection and/or diagnosis of SARS-CoV-2 by  FDA under an Emergency Use Authorization (EUA). This EUA will remain  in effect (meaning this test can be used) for the duration of the  Covid-19 declaration under Section 564(b)(1) of the Act, 21  U.S.C. section 360bbb-3(b)(1), unless the authorization is  terminated or revoked. Performed at [facility], [address] [place], [place] [address]   Culture, blood (routine x 2)     Status: None (Preliminary result)   Collection Time: 09/30/19 10:31 PM   Specimen: Left Antecubital; Blood  Result Value Ref Range Status   Specimen Description LEFT ANTECUBITAL  Final   Special Requests Blood Culture  adequate volume  Final   Culture   Final    NO GROWTH 2 DAYS Performed at [facility], [address] [place], [place] [address]    Report Status PENDING  Incomplete  Culture, blood (routine x 2)     Status: None (Preliminary result)   Collection Time: 09/30/19 10:36 PM   Specimen: BLOOD LEFT ARM  Result Value Ref Range Status   Specimen Description BLOOD LEFT ARM  Final   Special Requests Blood Culture adequate volume  Final   Culture   Final    NO GROWTH 2 DAYS Performed at [facility], [address], [place], [place] [address]    Report Status PENDING  Incomplete    Coagulation Studies: No results for input(s): LABPROT, INR in the last 72 hours.  Urinalysis: Recent Labs    09/30/19 2209  COLORURINE AMBER*  LABSPEC 1.017  PHURINE 5.0  GLUCOSEU NEGATIVE  HGBUR SMALL*  BILIRUBINUR NEGATIVE  KETONESUR NEGATIVE  PROTEINUR 100*  NITRITE NEGATIVE  LEUKOCYTESUR SMALL*      Imaging: CT ABDOMEN PELVIS WO  CONTRAST  Result Date: 10/01/2019 CLINICAL DATA:  hydronephrosis. EXAM: CT ABDOMEN AND PELVIS WITHOUT CONTRAST TECHNIQUE: Multidetector CT imaging of the abdomen and pelvis was performed following the standard protocol without IV contrast. COMPARISON:  Ultrasound 10/01/2019 FINDINGS: Lower chest: Branching density within the right middle lobe likely represents an area of mucous plugging within distal airway. Subsegmental atelectasis versus scar identified within both lung bases. Hepatobiliary: 9 x 4 mm low-density structure within anterior dome of liver is too small to characterize. Segment 5 low-density structure is also too small to characterize measuring 7 mm. Possible sludge within dependent portion of gallbladder. Pancreas: Unremarkable. No pancreatic ductal dilatation or surrounding inflammatory changes. Spleen: Normal in size without focal abnormality. Adrenals/Urinary Tract: Normal appearance of the adrenal glands. Moderate right hydronephrosis and  hydroureter identified corresponding to the ultrasound abnormality. No definite evidence for ureteral lithiasis identified to explain the hydronephrosis. There are several calcified scratch set multiple calcifications are identified along the course of the right ureter which are favored to represent phleboliths. The largest measures approximately 4 mm, image 63/2. A partially decompressed patulous urinary bladder is identified containing gas and a Foley catheter the. No stone identified within the bladder. Within the limitations of unenhanced technique no mass within the bladder noted. There is mild left pelvocaliectasis and hydroureter. Stomach/Bowel: Abnormal wall thickening involving the mid and distal small bowel loops identified concerning for enteritis. The single wall thickness of the terminal ileum measures up to 9 mm. No bowel obstruction. Bowel wall edema with mild surrounding inflammation is noted involving the descending colon, image 60/2. No pathologic dilatation of the colon. No pneumatosis identified. Vascular/Lymphatic: Aortic atherosclerosis identified. No aneurysm. No abdominopelvic adenopathy identified. Reproductive: Unremarkable. Other: No free fluid or fluid collections. Musculoskeletal: Osteopenia and multilevel degenerative disc disease identified within the lumbar spine. Lumbar scoliosis is convex towards the right. Hardware fixation of the proximal left femur fracture noted. IMPRESSION: 1. Moderate right-sided hydronephrosis and hydroureter identified. Mild left pelvocaliectasis and proximal hydroureter. No kidney stones or ureteral lithiasis identified. 2. Large, partially collapsed, patulous urinary bladder contains both gas as well as a Foley catheter balloon. No focal bladder calcification or mass noted (within the limitations of unenhanced technique). 3. Suspect enteritis involving the mid and distal small bowel loops and colitis involving the descending colon. No pneumatosis, bowel  perforation, or pathologic dilatation of the large or small bowel loops. Aortic Atherosclerosis (ICD10-I70.0). Electronically Signed   By: Signa Kell M.D.   On: 10/01/2019 10:44   CT Head Wo Contrast  Result Date: 09/30/2019 CLINICAL DATA:  Somnolent, history of recent lung bone fracture EXAM: CT HEAD WITHOUT CONTRAST TECHNIQUE: Contiguous axial images were obtained from the base of the skull through the vertex without intravenous contrast. COMPARISON:  04/19/2018 FINDINGS: Brain: No acute infarct or hemorrhage. Lateral ventricles and midline structures are unremarkable. No acute extra-axial fluid collections. No mass effect. Vascular: No hyperdense vessel or unexpected calcification. Skull: Normal. Negative for fracture or focal lesion. Sinuses/Orbits: No acute finding. Other: None IMPRESSION: 1. Stable head CT, no acute process. Electronically Signed   By: Sharlet Salina M.D.   On: 09/30/2019 23:34   US RENAL  Result Date: 10/01/2019 CLINICAL DATA:  60 year old with acute kidney injury. EXAM: RENAL / URINARY TRACT ULTRASOUND COMPLETE COMPARISON:  None. FINDINGS: Right Kidney: Renal measurements: 10.2 x 5.1 x 5.7 cm = volume: 157 mL. Mild hydronephrosis and proximal hydroureter. Mild thinning of the renal parenchyma. No perinephric fluid collection. No suspicious mass or evident calculi. Left  Kidney: Renal measurements: 10.1 x 6.0 x 5.6 cm = volume: 177 mL. Echogenicity within normal limits. No mass or hydronephrosis visualized. Bladder: Decompressed by Foley catheter. Other: Incidental layering sludge in the gallbladder. IMPRESSION: 1. Mild right hydronephrosis without identifiable cause. There is mild thinning of the right renal parenchyma which may indicate chronicity, but is nonspecific. No prior exams available for comparison. 2. Normal sonographic appearance of the left kidney. Electronically Signed   By: Narda Rutherford M.D.   On: 10/01/2019 02:56   DG Chest Port 1 View  Result Date:  09/30/2019 CLINICAL DATA:  Altered mental status EXAM: PORTABLE CHEST 1 VIEW COMPARISON:  Radiograph 08/25/2019 FINDINGS: Dense bandlike opacity in the right lung base likely reflecting subsegmental atelectasis with associated volume loss in the right hemithorax. No consolidative opacity. No pneumothorax or effusion. The cardiomediastinal contours are stable from prior. No acute osseous or soft tissue abnormality. Degenerative changes are present in the imaged spine and shoulders. Telemetry leads overlie the chest. IMPRESSION: Dense bandlike opacity in the right lung base likely reflecting subsegmental atelectasis. No other acute cardiopulmonary abnormality. Electronically Signed   By: Kreg Shropshire M.D.   On: 09/30/2019 22:48     Medications:   . cefTRIAXone (ROCEPHIN)  IV Stopped (10/01/19 2216)  . dextrose 125 mL/hr at 10/02/19 0922   . Chlorhexidine Gluconate Cloth  6 each Topical Daily  . levothyroxine  75 mcg Intravenous QAC breakfast   acetaminophen **OR** acetaminophen, ondansetron **OR** ondansetron (ZOFRAN) IV  Assessment/ Plan:  86 y.o. female with a PMHx of dementia, depression, hypertension, hypothyroidism, recent falls who was admitted to [facility] on 09/30/2019 for evaluation of altered mental status and decreased responsiveness.  She had a recent left femoral fracture and was subsequently admitted to a rehabilitation facility.  She unfortunately suffered another fracture there and then subsequently went to [facility].  1.  Acute kidney injury likely secondary to ATN as well as right-sided hydronephrosis. 2.  Right-sided hydronephrosis. 3.  Hypernatremia. 4.  Altered mental status, likely multifactorial.  Plan: Renal parameters have improved a bit though BUN and creatinine still remain high.  BUN currently 129 with a creatinine of 3.84.  Since volume was corrected we agree with discontinuation of saline based solutions and now transitioning to D5W at 125 cc/h.  Serum sodium  currently 154.  This should come down appropriately with IV fluid hydration.  Continue ceftriaxone for treatment of underlying UTI.   LOS: 1 Jaime [name] 2/14/20214:22 PM

## 2019-10-02 NOTE — Evaluation (Signed)
Physical Therapy Evaluation Patient Details Name: Jaime [name] MRN: [id number] DOB: May 13, 1933 Today's Date: 10/02/2019   History of Present Illness  86 year old female was admitted to hosp just a day after leaving SNF to go home from a L hip ORIF on 08/26/19, with previous periprosthetic fracture on same hip.  Pt sustained the fall in same SNF that led to fracture, now noted to have mult areas of skin breakdown and injury including circumferential injury on chest.  Admitted with susp hypotension, sepsis from UTI, metabolic encephalopathy, AKI, hypothyroidism.  PMHx:  dementia, HTN,   Clinical Impression  Pt was seen for mobility of rolling and repositioning, and pt was resistant but then more comfortable with being cared for to clean up.  Her plan is to make progress with working toward sitting and standing as she hopefully is more interactive and alert.  Her family is not on the premises to talk with them about her eval but will hopefully be able to discuss the expectations for follow up, and to discuss alternatives to her going back to rehab again.  Pt is at SNF level of care currently.    Follow Up Recommendations SNF    Equipment Recommendations  None recommended by PT    Recommendations for Other Services       Precautions / Restrictions Precautions Precautions: Fall Precaution Comments: skin breakdown Restrictions Weight Bearing Restrictions: No      Mobility  Bed Mobility Overal bed mobility: Needs Assistance Bed Mobility: Rolling Rolling: Max assist         General bed mobility comments: pt is not alert enough to move with sitting on side of bed and resisting the attempt to assist her  Transfers                 General transfer comment: unable to stand  Ambulation/Gait                Stairs            Wheelchair Mobility    Modified Rankin (Stroke Patients Only)       Balance                                              Pertinent Vitals/Pain Pain Assessment: Faces Faces Pain Scale: Hurts little more Pain Location: generally with movement Pain Descriptors / Indicators: Grimacing;Guarding Pain Intervention(s): Monitored during session;Repositioned    Home Living Family/patient expects to be discharged to:: Private residence Living Arrangements: Children Available Help at Discharge: Available 24 hours/day;Personal care attendant Type of Home: House Home Access: Stairs to enter Entrance Stairs-Rails: Right;Left;Can reach both Secretary/administrator of Steps: 3 Home Layout: Multi-level;1/2 bath on main level;Bed/bath upstairs   Additional Comments: PLOF and living arrangement info from previous chart, pt is a poor historian    Prior Function Level of Independence: Needs assistance   Gait / Transfers Assistance Needed: pt was ambulatory prev with RW  ADL's / Homemaking Assistance Needed: (had caregivers at home)  Comments: Caregivers 24/7 (7am-3pm; 3pm to 11 pm; 11pm to 7am) for pt and pt's son     Hand Dominance   Dominant Hand: Right    Extremity/Trunk Assessment   Upper Extremity Assessment Upper Extremity Assessment: Generalized weakness    Lower Extremity Assessment Lower Extremity Assessment: Generalized weakness;LLE deficits/detail LLE Deficits / Details: L hip has had 2 recent history  surgeries for fracture LLE: Unable to fully assess due to immobilization LLE Coordination: decreased gross motor    Cervical / Trunk Assessment Cervical / Trunk Assessment: Kyphotic  Communication   Communication: HOH  Cognition Arousal/Alertness: Lethargic Behavior During Therapy: Flat affect Overall Cognitive Status: No family/caregiver present to determine baseline cognitive functioning                                 General Comments: pt is somewhat following commands but is demonstrating confusion that could be from sepsis from this admission      General  Comments General comments (skin integrity, edema, etc.): pt in bed with watery stool on gown and herself, assisted with rolling as tolerated to get cleaned up and to reposition but also noted significant skin breakdown mult areas    Exercises     Assessment/Plan    PT Assessment Patient needs continued PT services  PT Problem List Decreased strength;Decreased range of motion;Decreased activity tolerance;Decreased mobility;Decreased cognition;Decreased safety awareness;Decreased skin integrity;Pain       PT Treatment Interventions DME instruction;Gait training;Functional mobility training;Therapeutic activities;Therapeutic exercise;Balance training;Neuromuscular re-education;Patient/family education    PT Goals (Current goals can be found in the Care Plan section)  Acute Rehab PT Goals Patient Stated Goal: none stated PT Goal Formulation: Patient unable to participate in goal setting Time For Goal Achievement: 10/16/19 Potential to Achieve Goals: Fair    Frequency Min 2X/week   Barriers to discharge Decreased caregiver support pt requires two person help     Co-evaluation               AM-PAC PT "6 Clicks" Mobility  Outcome Measure Help needed turning from your back to your side while in a flat bed without using bedrails?: A Lot Help needed moving from lying on your back to sitting on the side of a flat bed without using bedrails?: A Lot Help needed moving to and from a bed to a chair (including a wheelchair)?: A Lot Help needed standing up from a chair using your arms (e.g., wheelchair or bedside chair)?: Total Help needed to walk in hospital room?: Total Help needed climbing 3-5 steps with a railing? : Total 6 Click Score: 9    End of Session   Activity Tolerance: Patient limited by fatigue;Treatment limited secondary to medical complications (Comment) Patient left: in bed;with call bell/phone within reach;with bed alarm set;with nursing/sitter in room Nurse  Communication: Mobility status;Other (comment)(talked with nursing about pt's issues of skin changes) PT Visit Diagnosis: Muscle weakness (generalized) (M62.81);History of falling (Z91.81);Adult, failure to thrive (R62.7);Pain Pain - Right/Left: (general) Pain - part of body: Hip;Knee;Leg    Time: 1400-1435 PT Time Calculation (min) (ACUTE ONLY): 35 min   Charges:   PT Evaluation $PT Eval Moderate Complexity: 1 Mod PT Treatments $Therapeutic Activity: 8-22 mins       Ramond Dial 10/02/2019, 9:36 PM  Mee Hives, PT MS Acute Rehab Dept. Number: [place] [facility] [place]

## 2019-10-02 NOTE — Progress Notes (Signed)
PT Cancellation Note  Patient Details Name: Anasia Agro MRN: [id number] DOB: 1932-12-14   Cancelled Treatment:    Reason Eval/Treat Not Completed: Patient's level of consciousness.  Lethargic and not responding verbally to PT.  Will try again at another time.   Ivar Drape 10/02/2019, 11:41 AM   Samul Dada, PT MS Acute Rehab Dept. Number: [facility] [id number] and [facility] [phone]

## 2019-10-02 NOTE — Progress Notes (Signed)
Updated Daughters [name] and [place] over the phone

## 2019-10-02 NOTE — Progress Notes (Signed)
Tried to offer patient mouth care. She refused mouth wash with the swab but allowed me to put mouth gel on her lips.

## 2019-10-02 NOTE — Progress Notes (Signed)
   Subjective Afebrile and HDS overnight  Physical Exam: BP (!) 115/46 (BP Location: Left Arm)   Pulse 85   Temp (!) 97.5 F (36.4 C) (Oral)   Resp 18   Ht 5\' 8"  (1.727 m)   Wt 71 kg   SpO2 100%   BMI 23.80 kg/m    Laboratory Data: Leukocytosis downtrending 18.5(24) sCr improved 3.84(8.38)  Assessment & Plan:   86 yo F admitted with UTI and likely sepsis from urinary source and AKI secondary to urinary retention. Questionable right distal ureteral stone vs phlebolith on CT. After discussion with daughter, she elected conservative approach with abx, foley, and fluid resuscitation over cystoscopy, right retrograde pyelogram, and possible stent placement. Leukocytosis and renal function both improved significantly today, good UOP. Will continue abx and resucitation, no plans for surgical intervention today, urology will continue to follow. Consider repeat imaging tomorrow pending lab results and clinical status to confirm resolution of hydronephrosis.  83, MD

## 2019-10-03 LAB — RENAL FUNCTION PANEL
Albumin: 1.7 g/dL — ABNORMAL LOW (ref 3.5–5.0)
Anion gap: 5 (ref 5–15)
BUN: 79 mg/dL — ABNORMAL HIGH (ref 8–23)
CO2: 22 mmol/L (ref 22–32)
Calcium: 7.5 mg/dL — ABNORMAL LOW (ref 8.9–10.3)
Chloride: 120 mmol/L — ABNORMAL HIGH (ref 98–111)
Creatinine, Ser: 1.77 mg/dL — ABNORMAL HIGH (ref 0.44–1.00)
GFR calc Af Amer: 30 mL/min — ABNORMAL LOW (ref >60)
GFR calc non Af Amer: 26 mL/min — ABNORMAL LOW (ref >60)
Glucose, Bld: 158 mg/dL — ABNORMAL HIGH (ref 70–99)
Phosphorus: 2.2 mg/dL — ABNORMAL LOW (ref 2.5–4.6)
Potassium: 3.1 mmol/L — ABNORMAL LOW (ref 3.5–5.1)
Sodium: 147 mmol/L — ABNORMAL HIGH (ref 135–145)

## 2019-10-03 LAB — CBC
HCT: 27.6 % — ABNORMAL LOW (ref 36.0–46.0)
Hemoglobin: 8.2 g/dL — ABNORMAL LOW (ref 12.0–15.0)
MCH: 28.8 pg (ref 26.0–34.0)
MCHC: 29.7 g/dL — ABNORMAL LOW (ref 30.0–36.0)
MCV: 96.8 fL (ref 80.0–100.0)
Platelets: 148 10*3/uL — ABNORMAL LOW (ref 150–400)
RBC: 2.85 MIL/uL — ABNORMAL LOW (ref 3.87–5.11)
RDW: 15.7 % — ABNORMAL HIGH (ref 11.5–15.5)
WBC: 9.6 10*3/uL (ref 4.0–10.5)
nRBC: 0 % (ref 0.0–0.2)

## 2019-10-03 LAB — URINE CULTURE: Culture: 80000 — AB

## 2019-10-03 LAB — CK: Total CK: 91 U/L (ref 38–234)

## 2019-10-03 MED ORDER — ADULT MULTIVITAMIN W/MINERALS CH
1.0000 | ORAL_TABLET | Freq: Every day | ORAL | Status: DC
  Administered 2019-10-05 – 2019-10-10 (×6): 1 via ORAL
  Filled 2019-10-03 (×7): qty 1

## 2019-10-03 MED ORDER — POTASSIUM CL IN DEXTROSE 5% 20 MEQ/L IV SOLN
20.0000 meq | INTRAVENOUS | Status: DC
  Administered 2019-10-03 (×2): 20 meq via INTRAVENOUS
  Filled 2019-10-03 (×3): qty 1000

## 2019-10-03 MED ORDER — POTASSIUM CHLORIDE 10 MEQ/100ML IV SOLN
10.0000 meq | INTRAVENOUS | Status: AC
  Administered 2019-10-03 (×4): 10 meq via INTRAVENOUS
  Filled 2019-10-03 (×4): qty 100

## 2019-10-03 MED ORDER — SODIUM CHLORIDE 0.9 % IV SOLN
INTRAVENOUS | Status: DC | PRN
  Administered 2019-10-03 – 2019-10-04 (×2): 250 mL via INTRAVENOUS

## 2019-10-03 MED ORDER — CHLORHEXIDINE GLUCONATE 0.12 % MT SOLN
15.0000 mL | Freq: Two times a day (BID) | OROMUCOSAL | Status: DC
  Administered 2019-10-03 – 2019-10-10 (×15): 15 mL via OROMUCOSAL
  Filled 2019-10-03 (×15): qty 15

## 2019-10-03 MED ORDER — ORAL CARE MOUTH RINSE
15.0000 mL | Freq: Two times a day (BID) | OROMUCOSAL | Status: DC
  Administered 2019-10-03 – 2019-10-09 (×9): 15 mL via OROMUCOSAL

## 2019-10-03 NOTE — Progress Notes (Signed)
PT Cancellation Note  Patient Details Name: Jaime [name] MRN: [id number] DOB: [birth year]   Cancelled Treatment:    Reason Eval/Treat Not Completed: Other (comment).  Pt sleeping in bed upon PT arrival.  Pt woken with vc's and tactile cues and then pt started talking (not in conversation though); therapist unable to understand any of pt's words.  Pt did not follow any commands and resisting attempts at movement so deferred further attempts at mobility.  Will re-attempt PT treatment session at a later date/time as able.  Hendricks Limes, PT 10/03/19, 1:30 PM

## 2019-10-03 NOTE — Progress Notes (Addendum)
Attempted swallow eval with little success. At first Pt was too lethargic to participate safely. Attempted oral care also without success. Pt eventually opened her eyes and attempted to communicate. When asked if she would try a piece of ice or a bite of food, she shook her head no several times and again when attempted to put head of bed up stating " please no". Places a small amount of applesauce at lips to see if Pt would manipulate, Pt winced and shook her head no. Very gently removed from lips with toothette. Nsg in the room and reports she was able gently perform oral care earlier but it did seem painful to Pt. Noted blue tipped fingers, Nsg trying to get O2 reading at present. See bedside swallow eval. Further assessement through treatment sessions as alertness improves.  Addendum: Nsg reports O2 sats at 99 percent

## 2019-10-03 NOTE — Progress Notes (Signed)
   10/03/19 1400  Clinical Encounter Type  Visited With Patient  Visit Type Initial  Referral From Chaplain  Consult/Referral To Chaplain  Chaplain stopped in to see patient, but she was not lucent, therefore Chaplain prayed silently and left.

## 2019-10-03 NOTE — Evaluation (Signed)
Clinical/Bedside Swallow Evaluation Patient Details  Name: Jaime [name] MRN: [id number] Date of Birth: [birth year]  Today's Date: 10/03/2019 Time: SLP Start Time (ACUTE ONLY): 81 SLP Stop Time (ACUTE ONLY): 1334 SLP Time Calculation (min) (ACUTE ONLY): 54 min  Past Medical History:  Past Medical History:  Diagnosis Date  . Anxiety   . Dementia ([place])    per daughter  . Depression   . Hypertension   . Hypothyroidism    Past Surgical History:  Past Surgical History:  Procedure Laterality Date  . ABDOMINAL HYSTERECTOMY    . appendectomy    . FEMUR IM NAIL Left 08/22/2019   Procedure: INTRAMEDULLARY (IM) NAIL FEMORAL, LEFT;  Surgeon: Lovell Sheehan, MD;  Location: [facility];  Service: Orthopedics;  Laterality: Left;  . MINOR HEMORRHOIDECTOMY    . OPEN REDUCTION INTERNAL FIXATION (ORIF) DISTAL RADIAL FRACTURE Left 09/11/2015   Procedure: OPEN REDUCTION INTERNAL FIXATION (ORIF) DISTAL RADIAL FRACTURE;  Surgeon: Hessie Knows, MD;  Location: [facility];  Service: Orthopedics;  Laterality: Left;  . ORIF FEMUR FRACTURE Left 08/26/2019   Procedure: OPEN REDUCTION INTERNAL FIXATION (ORIF) DISTAL FEMUR FRACTURE;  Surgeon: Shona Needles, MD;  Location: [facility];  Service: Orthopedics;  Laterality: Left;  . TONSILLECTOMY    . TUBAL LIGATION     HPI: Per history and Physical   "Jaime [name]  is a 86 y.o. Caucasian female with a known history of depression and anxiety, hypertension and hypothyroidism as well as dementia, presented to the emergency room with acute onset of altered mental status with decreased responsiveness.  Patient has dementia at her baseline but with talking ambulate and feeds herself and has not been doing anything today.  She was just discharged today from [place] [facility] where she has been the last 3 weeks after having a left femoral fracture and going there then unfortunately falling and having another left distal femoral fracture for which she went to [facility].  The patient was fairly obtunded and confused and therefore no history could be obtained from her."  Assessment / Plan / Recommendation Clinical Impression  Swallow eval extremely limited secondary to lethargy and Pt unwillingness to take Po's. Upon entering, Pt was lethargic and would barely open her eyes. After several minutes, Pt began attempting communication and kept her eyes open for a few minutes, some improved since attempted swallow eval Saturday. Noted dried blood around front teeth. ST attempted oral care, but Pt shook her head no and began crying when attempted. Nsg reports she was able to do gentle oral care earlier today but Pt may have more pain around her teeth after removal of crusted blood. Attempted small amount of applesauce on Pts lips, which she removed with her tongue but then did not propel for a bolus. Applesauce was gently removed with toothette. Pt refused any further trials including ice chip. Pt is currently on a dysphagia 1 diet with honey thick liquids after ST downgraded diet Saturday. Nsg aware that Pt should not be fed unless fully alert. Will continue to assess through treatment sessions.  SLP Visit Diagnosis: Dysphagia, oropharyngeal phase (R13.12)    Aspiration Risk  Moderate aspiration risk;Risk for inadequate nutrition/hydration    Diet Recommendation Dysphagia 1 (Puree);Honey-thick liquid   Medication Administration: Via alternative means Postural Changes: Seated upright at 90 degrees;Remain upright for at least 30 minutes after po intake    Other  Recommendations Oral Care Recommendations: Staff/trained caregiver to provide oral care   Follow up Recommendations Skilled Nursing  facility      Frequency and Duration min 2x/week  1 week       Prognosis Prognosis for Safe Diet Advancement: Guarded Barriers to Reach Goals: Cognitive deficits;Behavior      Swallow Study   General Date of Onset: 10/01/19 Type of Study: Bedside Swallow  Evaluation Diet Prior to this Study: Regular Temperature Spikes Noted: No Respiratory Status: Room air History of Recent Intubation: No Behavior/Cognition: Lethargic/Drowsy Oral Cavity Assessment: Dried secretions;Dry;Other (comment)(Noted dried blood around bottom teeth) Oral Care Completed by SLP: Recent completion by staff Oral Cavity - Dentition: Missing dentition Vision: Impaired for self-feeding Self-Feeding Abilities: Total assist Patient Positioning: Partially reclined Baseline Vocal Quality: Hoarse;Low vocal intensity    Oral/Motor/Sensory Function     Ice Chips Ice chips: Impaired   Thin Liquid      Nectar Thick     Honey Thick     Puree Puree: Impaired Presentation: (small amount placed on lips) Oral Phase Impairments: Reduced lingual movement/coordination Oral Phase Functional Implications: Oral holding   Solid            Eather Colas 10/03/2019,1:35 PM

## 2019-10-03 NOTE — Progress Notes (Signed)
Urology Inpatient Progress Note  Subjective: Jaime [name] is a 86 y.o. female with PMH dementia and recent falls requiring hospitalizations for orthopedic repair admitted on 09/30/2019 with AMS and subsequently found to have UTI and likely urosepsis with urinary retention and associated AKI.  Urine culture preliminary positive for gram-negative rods.  Blood cultures pending with no growth at 2 days.  On antibiotics as below as well as IV fluids for resuscitation.  WBC count down today, 9.6.  Creatinine down today, 1.77.  Foley catheter in place draining clear, yellow urine.  Patient arousable but minimally responsive to verbal commands today.  Anti-infectives: Anti-infectives (From admission, onward)   Start     Dose/Rate Route Frequency Ordered Stop   10/01/19 2200  cefTRIAXone (ROCEPHIN) 1 g in sodium chloride 0.9 % 100 mL IVPB     1 g 200 mL/hr over 30 Minutes Intravenous Every 24 hours 10/01/19 0037     09/30/19 2215  cefTRIAXone (ROCEPHIN) 1 g in sodium chloride 0.9 % 100 mL IVPB     1 g 200 mL/hr over 30 Minutes Intravenous  Once 09/30/19 2208 09/30/19 2319      Current Facility-Administered Medications  Medication Dose Route Frequency Provider Last Rate Last Admin  . acetaminophen (TYLENOL) tablet 650 mg  650 mg Oral Q6H PRN Mansy, Jan A, MD       Or  . acetaminophen (TYLENOL) suppository 650 mg  650 mg Rectal Q6H PRN Mansy, Jan A, MD      . cefTRIAXone (ROCEPHIN) 1 g in sodium chloride 0.9 % 100 mL IVPB  1 g Intravenous Q24H Mansy, Vernetta Honey, MD   Stopped at 10/02/19 2229  . Chlorhexidine Gluconate Cloth 2 % PADS 6 each  6 each Topical Daily Danford, Earl Lites, MD   6 each at 10/02/19 [id number]  . dextrose 5 % solution   Intravenous Continuous Alberteen Sam, MD 125 mL/hr at 10/03/19 3151 Rate Verify at 10/03/19 7616  . levothyroxine (SYNTHROID, LEVOTHROID) injection 75 mcg  75 mcg Intravenous QAC breakfast Alberteen Sam, MD   75 mcg at 10/03/19 0546  .  ondansetron (ZOFRAN) tablet 4 mg  4 mg Oral Q6H PRN Mansy, Jan A, MD       Or  . ondansetron [facility]) injection 4 mg  4 mg Intravenous Q6H PRN Mansy, Jan A, MD       Objective: Vital signs in last 24 hours: Temp:  [97.7 F (36.5 C)-98 F (36.7 C)] 97.7 F (36.5 C) (02/15 0519) Pulse Rate:  [79-91] 79 (02/15 0519) Resp:  [18-20] 20 (02/15 0519) BP: (112-121)/(30-40) 121/40 (02/15 0519) SpO2:  [97 %-98 %] 98 % (02/15 0519)  Intake/Output from previous day: 02/14 0701 - 02/15 0700 In: 2784.1 [P.O.:120; I.V.:2553.5; IV Piggyback:110.7] Out: 450 [Urine:450] Intake/Output this shift: No intake/output data recorded.  Physical Exam Vitals and nursing note reviewed.  Constitutional:      General: She is sleeping. She is not in acute distress.    Appearance: She is not ill-appearing, toxic-appearing or diaphoretic.  HENT:     Head: Normocephalic and atraumatic.  Pulmonary:     Effort: Pulmonary effort is normal. No respiratory distress.  Skin:    General: Skin is warm and dry.    Lab Results:  Recent Labs    10/02/19 0546 10/03/19 0551  WBC 18.5* 9.6  HGB 8.8* 8.2*  HCT 29.3* 27.6*  PLT 227 148*   BMET Recent Labs    10/02/19 0546 10/03/19 0551  NA  154* 147*  K 3.7 3.1*  CL 124* 120*  CO2 17* 22  GLUCOSE 95 158*  BUN 129* 79*  CREATININE 3.84* 1.77*  CALCIUM 7.8* 7.5*   Assessment & Plan: 86 year old female admitted with urinary retention and AKI as well as UTI with likely urosepsis, clinically improving on empiric antibiotics and fluid resuscitation.  We will continue to follow urine and blood cultures; recommend narrowing antibiotics per results.  Given downtrend in creatinine following bladder decompression with Foley placement, will defer further imaging at this time.  Urology will continue to follow.  Debroah Loop, PA-C 10/03/2019

## 2019-10-03 NOTE — Progress Notes (Signed)
[facility]  ROUNDING NOTE   Subjective:  Patient seen and evaluated at bedside. Lethargic but arousable.  Objective:  Vital signs in last 24 hours:  Temp:  [97.7 F (36.5 C)-98 F (36.7 C)] 97.9 F (36.6 C) (02/15 1605) Pulse Rate:  [79-91] 83 (02/15 1605) Resp:  [18-20] 20 (02/15 0519) BP: (112-127)/(30-50) 127/50 (02/15 1605) SpO2:  [97 %-98 %] 98 % (02/15 0519)  Weight change:  Filed Weights   09/30/19 2205  Weight: 71 kg    Intake/Output: I/O last 3 completed shifts: In: 4241.8 [P.O.:120; I.V.:3904.7; IV Piggyback:217.1] Out: 1050 [Urine:1050]   Intake/Output this shift:  Total I/O In: 353 [I.V.:353] Out: 600 [Urine:600]  Physical Exam: General: No acute distress  Head: Normocephalic, atraumatic. Moist oral mucosal membranes  Eyes: Anicteric  Neck: Supple, trachea midline  Lungs:  Clear to auscultation, normal effort  Heart: S1S2 no rubs  Abdomen:  Soft, nontender, bowel sounds present  Extremities: No peripheral edema.  Neurologic: Arousable, not consistently following commands  Skin: No lesions       Basic Metabolic Panel: Recent Labs  Lab 09/30/19 2209 09/30/19 2209 10/01/19 0415 10/02/19 0546 10/03/19 0551  NA 149*  --  151* 154* 147*  K 5.9*  --  4.5 3.7 3.1*  CL 108  --  120* 124* 120*  CO2 19*  --  13* 17* 22  GLUCOSE 158*  --  142* 95 158*  BUN 207*  --  176* 129* 79*  CREATININE 10.98*  --  8.38* 3.84* 1.77*  CALCIUM 8.9   < > 7.2* 7.8* 7.5*  PHOS  --   --   --  4.1 2.2*   < > = values in this interval not displayed.    Liver Function Tests: Recent Labs  Lab 09/30/19 2209 10/02/19 0546 10/03/19 0551  AST 26  --   --   ALT 12  --   --   ALKPHOS 108  --   --   BILITOT 0.8  --   --   PROT 8.1  --   --   ALBUMIN 2.3* 1.8* 1.7*   No results for input(s): LIPASE, AMYLASE in the last 168 hours. No results for input(s): AMMONIA in the last 168 hours.  CBC: Recent Labs  Lab 09/30/19 2209 10/01/19 0415  10/02/19 0546 10/03/19 0551  WBC 29.1* 24.0* 18.5* 9.6  NEUTROABS 26.6*  --   --   --   HGB 10.7* 9.7* 8.8* 8.2*  HCT 35.3* 33.1* 29.3* 27.6*  MCV 94.1 97.9 95.8 96.8  PLT 442* 282 227 148*    Cardiac Enzymes: Recent Labs  Lab 09/30/19 2209 10/02/19 0546 10/03/19 0551  CKTOTAL 418* 154 91    BNP: Invalid input(s): POCBNP  CBG: No results for input(s): GLUCAP in the last 168 hours.  Microbiology: Results for orders placed or performed during the hospital encounter of 09/30/19  Urine culture     Status: Abnormal   Collection Time: 09/30/19 10:09 PM   Specimen: Urine, Random  Result Value Ref Range Status   Specimen Description   Final    URINE, RANDOM Performed at [facility], [address] [place], [place] [address]    Special Requests   Final    NONE Performed at [facility], [address] [place], [place] [address]    Culture 80,000 COLONIES/mL ENTEROBACTER CLOACAE (A)  Final   Report Status 10/03/2019 FINAL  Final   Organism ID, Bacteria ENTEROBACTER CLOACAE (A)  Final  Susceptibility   Enterobacter cloacae - MIC*    CEFAZOLIN >=64 RESISTANT Resistant     CIPROFLOXACIN <=0.25 SENSITIVE Sensitive     GENTAMICIN <=1 SENSITIVE Sensitive     IMIPENEM 1 SENSITIVE Sensitive     NITROFURANTOIN 32 SENSITIVE Sensitive     TRIMETH/SULFA <=20 SENSITIVE Sensitive     PIP/TAZO 16 SENSITIVE Sensitive     * 80,000 COLONIES/mL ENTEROBACTER CLOACAE  Respiratory Panel by RT PCR (Flu A&B, Covid) - Urine, Clean Catch     Status: None   Collection Time: 09/30/19 10:09 PM   Specimen: Urine, Clean Catch  Result Value Ref Range Status   SARS Coronavirus 2 by RT PCR NEGATIVE NEGATIVE Final    Comment: (NOTE) SARS-CoV-2 target nucleic acids are NOT DETECTED. The SARS-CoV-2 RNA is generally detectable in upper respiratoy specimens during the acute phase of infection. The lowest concentration of SARS-CoV-2 viral copies this assay can detect is 131  copies/mL. A negative result does not preclude SARS-Cov-2 infection and should not be used as the sole basis for treatment or other patient management decisions. A negative result may occur with  improper specimen collection/handling, submission of specimen other than nasopharyngeal swab, presence of viral mutation(s) within the areas targeted by this assay, and inadequate number of viral copies (<131 copies/mL). A negative result must be combined with clinical observations, patient history, and epidemiological information. The expected result is Negative. Fact Sheet for Patients:  PinkCheek.be Fact Sheet for Healthcare Providers:  GravelBags.it This test is not yet ap proved or cleared by the [place] FDA and  has been authorized for detection and/or diagnosis of SARS-CoV-2 by FDA under an Emergency Use Authorization (EUA). This EUA will remain  in effect (meaning this test can be used) for the duration of the COVID-19 declaration under Section 564(b)(1) of the Act, 21 U.S.C. section 360bbb-3(b)(1), unless the authorization is terminated or revoked sooner.    Influenza A by PCR NEGATIVE NEGATIVE Final   Influenza B by PCR NEGATIVE NEGATIVE Final    Comment: (NOTE) The Xpert Xpress SARS-CoV-2/FLU/RSV assay is intended as an aid in  the diagnosis of influenza from Nasopharyngeal swab specimens and  should not be used as a sole basis for treatment. Nasal washings and  aspirates are unacceptable for Xpert Xpress SARS-CoV-2/FLU/RSV  testing. Fact Sheet for Patients: PinkCheek.be Fact Sheet for Healthcare Providers: GravelBags.it This test is not yet approved or cleared by the [place] FDA and  has been authorized for detection and/or diagnosis of SARS-CoV-2 by  FDA under an Emergency Use Authorization (EUA). This EUA will remain  in effect (meaning this test can  be used) for the duration of the  Covid-19 declaration under Section 564(b)(1) of the Act, 21  U.S.C. section 360bbb-3(b)(1), unless the authorization is  terminated or revoked. Performed at [facility], [address] [place], [place] [address]   Culture, blood (routine x 2)     Status: None (Preliminary result)   Collection Time: 09/30/19 10:31 PM   Specimen: Left Antecubital; Blood  Result Value Ref Range Status   Specimen Description LEFT ANTECUBITAL  Final   Special Requests Blood Culture adequate volume  Final   Culture   Final    NO GROWTH 3 DAYS Performed at [facility], [address], [place], [place] [address]    Report Status PENDING  Incomplete  Culture, blood (routine x 2)     Status: None (Preliminary result)   Collection Time: 09/30/19 10:36 PM  Specimen: BLOOD LEFT ARM  Result Value Ref Range Status   Specimen Description BLOOD LEFT ARM  Final   Special Requests Blood Culture adequate volume  Final   Culture   Final    NO GROWTH 3 DAYS Performed at [facility], [address] [place], [place] [address]    Report Status PENDING  Incomplete    Coagulation Studies: No results for input(s): LABPROT, INR in the last 72 hours.  Urinalysis: Recent Labs    09/30/19 2209  COLORURINE AMBER*  LABSPEC 1.017  PHURINE 5.0  GLUCOSEU NEGATIVE  HGBUR SMALL*  BILIRUBINUR NEGATIVE  KETONESUR NEGATIVE  PROTEINUR 100*  NITRITE NEGATIVE  LEUKOCYTESUR SMALL*      Imaging: No results found.   Medications:   . cefTRIAXone (ROCEPHIN)  IV Stopped (10/02/19 2229)  . dextrose 5 % with KCl 20 mEq / L 75 mL/hr at 10/03/19 0939   . chlorhexidine  15 mL Mouth Rinse BID  . Chlorhexidine Gluconate Cloth  6 each Topical Daily  . levothyroxine  75 mcg Intravenous QAC breakfast  . mouth rinse  15 mL Mouth Rinse q12n4p  . [START ON 10/04/2019] multivitamin with minerals  1 tablet Oral Daily   acetaminophen **OR** acetaminophen,  ondansetron **OR** ondansetron (ZOFRAN) IV  Assessment/ Plan:  86 y.o. female with a PMHx of dementia, depression, hypertension, hypothyroidism, recent falls who was admitted to [facility] on 09/30/2019 for evaluation of altered mental status and decreased responsiveness.  She had a recent left femoral fracture and was subsequently admitted to a rehabilitation facility.  She unfortunately suffered another fracture there and then subsequently went to [facility].  1.  Acute kidney injury likely secondary to ATN as well as right-sided hydronephrosis. 2.  Right-sided hydronephrosis. 3.  Hypernatremia. 4.  Altered mental status, likely multifactorial.  Plan: Metabolic parameters have significantly improved.  Serum sodium now down to 147.  BUN significantly improved down to 79 and creatinine also improved down to 1.7.  Continue IV fluid hydration for now.  Potassium low at 3.1.  Provide the patient potassium chloride 40 mEq IV in addition to the potassium that is within D5W.  Overall prognosis remains guarded.   LOS: 2 Karsten Vaughn 2/15/20215:47 PM

## 2019-10-03 NOTE — Progress Notes (Addendum)
Initial Nutrition Assessment  DOCUMENTATION CODES:   Severe malnutrition in context of chronic illness  INTERVENTION:   Honey Thick Mighty Shake TID, each supplement provides 200kcal and 7g protein   MVI daily   Pt at high refeed risk; recommend monitor K, Mg and P labs daily   NUTRITION DIAGNOSIS:   Severe Malnutrition related to chronic illness(dementia, advanced age) as evidenced by mild to severe fat depletions, moderate to severe muscle depletions.  GOAL:   Patient will meet greater than or equal to 90% of their needs  MONITOR:   Supplement acceptance, PO intake, Labs, Weight trends, Skin, I & O's  REASON FOR ASSESSMENT:   Malnutrition Screening Tool    ASSESSMENT:   86 y.o. female with PMH dementia and recent falls requiring hospitalizations for orthopedic repair admitted on 09/30/2019 with AMS and subsequently found to have UTI and likely urosepsis with urinary retention and associated AKI.   Unable to speak with pt r/t dementia. Pt seen by SLP and placed on a dysphagia 1/honey thick diet. Pt documented to be eating ~40% of meals in hospital. RD will add supplements and MVI to help pt meet her estimated needs. Pt is refeeding; recommend monitor electrolytes. Per chart, pt appears weight stable pta.   Medications reviewed and include: synthroid, ceftriaxone, 5% dextrose w/ KCl @75ml /hr  Labs reviewed: Na 147(H), K 3.1(L), BUN 79(H), creat 1.77(H), P 2.2(L) Hgb 8.2(L), Hct 27.6(L)  NUTRITION - FOCUSED PHYSICAL EXAM:    Most Recent Value  Orbital Region  Severe depletion  Upper Arm Region  Moderate depletion  Thoracic and Lumbar Region  Severe depletion  Buccal Region  Mild depletion  Temple Region  Severe depletion  Clavicle Bone Region  Severe depletion  Clavicle and Acromion Bone Region  Severe depletion  Scapular Bone Region  Severe depletion  Dorsal Hand  Severe depletion  Patellar Region  Moderate depletion  Anterior Thigh Region  No depletion   Posterior Calf Region  Mild depletion  Edema (RD Assessment)  None  Hair  Reviewed  Eyes  Reviewed  Mouth  Reviewed  Skin  Reviewed  Nails  Reviewed     Diet Order:   Diet Order            DIET - DYS 1 Room service appropriate? Yes; Fluid consistency: Honey Thick  Diet effective now             EDUCATION NEEDS:   No education needs have been identified at this time  Skin:  Skin Assessment: Reviewed RN Assessment(ecchymosis, MASD)  Last BM:  2/13- type 7  Height:   Ht Readings from Last 1 Encounters:  09/30/19 5\' 8"  (1.727 m)    Weight:   Wt Readings from Last 1 Encounters:  09/30/19 71 kg    Ideal Body Weight:  63.6 kg  BMI:  Body mass index is 23.8 kg/m.  Estimated Nutritional Needs:   Kcal:  1600-1800kcal/day  Protein:  80-90g/day  Fluid:  >1.6L/day  MS, RD, LDN Contact information available in Amion

## 2019-10-03 NOTE — Progress Notes (Signed)
PROGRESS NOTE    Jaime [name]  WVP:[id number] DOB: 02/23/1933 DOA: 09/30/2019 PCP: Jaime Kayser, MD      Brief Narrative:  Jaime [name] is an 86 y.o. F with HTN, hypothyroidism and mild dementia home dwelling who presented with confusion and decreased responsiveness/sensorium.  Patient had recently fallen and had a proximal femur fracture, been rehabilitated, then fell again and had a distal femur fracture and being admitted at [facility].  She was in rehab for several weeks, and just discharged a few days before admission here.  Since discharge, the patient had been sleepier and less responsive than usual, until the day of admission when she was impossible to arouse.  In the ER, creatinine 10.98, BUN 207.  She was started on fluids for renal failure and admitted.          Assessment & Plan:  Acute renal failure Metabolic acidosis due to renal failure Uremia due to renal failure Hyperkalemia due to renal failure Patient had foley placed on admission, subsequent renal US normal with mild hydro.  CT abdomen showed moderate hydronephrosis.  Overnight Cr 10.9 > 8.4 > 3.8 > still improving 1.77 UOP 500 cc last 24 hours Acidosis resolved.  K low -Continue IV fluids -Strict I/Os -Hold telmisartan -Avoid hypotension   Right hydronephrosis CT likely without stone.  She improved clincally with fluids and antibiotics.  URology consulted, recommended watchful waiting, Repeat imaging.   -Consult Urology, appreciate cares -Continue ceftriaxone -Repeat imaging today  Hypokalemia -Supplement K  Possible UTI with sepsis Presented with hypotension, hypothermia, leukocytosis, altered mental status, and renal failure and elevated lactic acid.  WBC normalized.  Culture with ENterobacter cloacae, reviewed with ID RPh and antibiogram, majority of isolates susc to CTX and patient clearly defervescing. -Continue ceftriaxone  Hypernatremia IMproving -Continue hypotonic fluids,  to correct at rate 0.5 mmol/L/hr -Trend BMP closely  Acute metabolic encephalopathy due to renal failure superimposed on dementia -PT eval -SLP eval  Atraumatic rhabdo CK resolved  Hypertension BP soft -Hold ARB -Continue IV fuids  Hypothyroidism -Continue levothyroxine IV  Mood disorder -Hold mirtazapine until improved  Recent fracture  Elevated troponin Without chest pain, this is due to poor clearance due to renal failure, not ischemia.  No further work up necessary.  Anemia Unclear cause, worsneing now is due to dilution.     Disposition: The patient was admitted with severe renal failure, acute metabolic encephalopathy from severe uremia, and possible infection with hydronephrosis.  Urology and nephrology have been consulted, and the patient's renal function is improving and she does not appear to have an impacted infection.   However, she remains encephalopathic, with BUN greater than 70, and will need significant ongoing IV fluids.  Prognosis still undetermined but concerning.          MDM: The below labs and imaging reports reviewed and summarized above.  Medication management as above.  This is a severe acute illness with threat to life or bodily function   DVT prophylaxis: SCDs Code Status: DNR Family Communication: Daughter by phone    Consultants:   Urology  Nephrology  Procedures:   US renal -- mild hydronephrosis, no stone  2/13 CT abdomen and pelvis-hydronephrosis, possible stone  Antimicrobials:   Ceftriaxone   Culture data:   2/13 blood culture x2 no growth  2/13 urine culture Enterobacter cloacae          Objective: Vitals:   10/02/19 0430 10/02/19 2015 10/02/19 2142 10/03/19 0519  BP: (!) 115/46 (!) 117/30 Marland Kitchen)  112/33 (!) 121/40  Pulse: 85 88 91 79  Resp: 18 18  20   Temp: (!) 97.5 F (36.4 C) 98 F (36.7 C)  97.7 F (36.5 C)  TempSrc: Oral Axillary  Oral  SpO2: 100% 97%  98%  Weight:      Height:         Intake/Output Summary (Last 24 hours) at 10/03/2019 1312 Last data filed at 10/03/2019 0958 Gross per 24 hour  Intake 3017.09 ml  Output 1050 ml  Net 1967.09 ml   Filed Weights   09/30/19 2205  Weight: 71 kg    Examination: General appearance: Thin elderly adult female, obtunded, stirs to touch or voice, no obvious distress   HEENT: Anicteric, conjunctiva pink, lids and lashes normal, atrophic, normal for age. No nasal deformity, discharge, epistaxis.  Lips dry, dentition in poor repair, oropharynx very dry,   Skin: Warm and dry.  No suspicious rashes or lesions. Cardiac: RRR, no murmurs appreciated.  No LE edema.    Respiratory: Normal respiratory rate and rhythm.  CTAB without rales or wheezes. Abdomen: Abdomen soft.  No grimace to palpation. No ascites, distension, hepatosplenomegaly.   MSK: No deformities or effusions of the large joints of the upper or lower extremities bilaterally. Neuro: Withdraws from pain, otherwise does not open eyes, makes no spontaneous verbalizations, no spontaneous movements, does not follow commands.    Psych: Unable to assess     Data Reviewed: I have personally reviewed following labs and imaging studies:  CBC: Recent Labs  Lab 09/30/19 2209 10/01/19 0415 10/02/19 0546 10/03/19 0551  WBC 29.1* 24.0* 18.5* 9.6  NEUTROABS 26.6*  --   --   --   HGB 10.7* 9.7* 8.8* 8.2*  HCT 35.3* 33.1* 29.3* 27.6*  MCV 94.1 97.9 95.8 96.8  PLT 442* 282 227 148*   Basic Metabolic Panel: Recent Labs  Lab 09/30/19 2209 10/01/19 0415 10/02/19 0546 10/03/19 0551  NA 149* 151* 154* 147*  K 5.9* 4.5 3.7 3.1*  CL 108 120* 124* 120*  CO2 19* 13* 17* 22  GLUCOSE 158* 142* 95 158*  BUN 207* 176* 129* 79*  CREATININE 10.98* 8.38* 3.84* 1.77*  CALCIUM 8.9 7.2* 7.8* 7.5*  PHOS  --   --  4.1 2.2*   GFR: Estimated Creatinine Clearance: 23.4 mL/min (A) (by C-G formula based on SCr of 1.77 mg/dL (H)). Liver Function Tests: Recent Labs  Lab  09/30/19 2209 10/02/19 0546 10/03/19 0551  AST 26  --   --   ALT 12  --   --   ALKPHOS 108  --   --   BILITOT 0.8  --   --   PROT 8.1  --   --   ALBUMIN 2.3* 1.8* 1.7*   No results for input(s): LIPASE, AMYLASE in the last 168 hours. No results for input(s): AMMONIA in the last 168 hours. Coagulation Profile: No results for input(s): INR, PROTIME in the last 168 hours. Cardiac Enzymes: Recent Labs  Lab 09/30/19 2209 10/02/19 0546 10/03/19 0551  CKTOTAL 418* 154 91   BNP (last 3 results) No results for input(s): PROBNP in the last 8760 hours. HbA1C: No results for input(s): HGBA1C in the last 72 hours. CBG: No results for input(s): GLUCAP in the last 168 hours. Lipid Profile: No results for input(s): CHOL, HDL, LDLCALC, TRIG, CHOLHDL, LDLDIRECT in the last 72 hours. Thyroid Function Tests: Recent Labs    10/01/19 0415  TSH 5.443*   Anemia Panel: No results for input(s):  VITAMINB12, FOLATE, FERRITIN, TIBC, IRON, RETICCTPCT in the last 72 hours. Urine analysis:    Component Value Date/Time   COLORURINE AMBER (A) 09/30/2019 2209   APPEARANCEUR TURBID (A) 09/30/2019 2209   LABSPEC 1.017 09/30/2019 2209   PHURINE 5.0 09/30/2019 2209   GLUCOSEU NEGATIVE 09/30/2019 2209   HGBUR SMALL (A) 09/30/2019 2209   BILIRUBINUR NEGATIVE 09/30/2019 2209   KETONESUR NEGATIVE 09/30/2019 2209   PROTEINUR 100 (A) 09/30/2019 2209   NITRITE NEGATIVE 09/30/2019 2209   LEUKOCYTESUR SMALL (A) 09/30/2019 2209   Sepsis Labs: @LABRCNTIP (procalcitonin:4,lacticacidven:4)  ) Recent Results (from the past 240 hour(s))  Urine culture     Status: Abnormal   Collection Time: 09/30/19 10:09 PM   Specimen: Urine, Random  Result Value Ref Range Status   Specimen Description   Final    URINE, RANDOM Performed at [facility], [address] [place], [place]    Special Requests   Final    NONE Performed at [facility], [address] [place], [place]  [place]    Culture 80,000 COLONIES/mL ENTEROBACTER CLOACAE (A)  Final   Report Status 10/03/2019 FINAL  Final   Organism ID, Bacteria ENTEROBACTER CLOACAE (A)  Final      Susceptibility   Enterobacter cloacae - MIC*    CEFAZOLIN >=64 RESISTANT Resistant     CIPROFLOXACIN <=0.25 SENSITIVE Sensitive     GENTAMICIN <=1 SENSITIVE Sensitive     IMIPENEM 1 SENSITIVE Sensitive     NITROFURANTOIN 32 SENSITIVE Sensitive     TRIMETH/SULFA <=20 SENSITIVE Sensitive     PIP/TAZO 16 SENSITIVE Sensitive     * 80,000 COLONIES/mL ENTEROBACTER CLOACAE  Respiratory Panel by RT PCR (Flu A&B, Covid) - Urine, Clean Catch     Status: None   Collection Time: 09/30/19 10:09 PM   Specimen: Urine, Clean Catch  Result Value Ref Range Status   SARS Coronavirus 2 by RT PCR NEGATIVE NEGATIVE Final    Comment: (NOTE) SARS-CoV-2 target nucleic acids are NOT DETECTED. The SARS-CoV-2 RNA is generally detectable in upper respiratoy specimens during the acute phase of infection. The lowest concentration of SARS-CoV-2 viral copies this assay can detect is 131 copies/mL. A negative result does not preclude SARS-Cov-2 infection and should not be used as the sole basis for treatment or other patient management decisions. A negative result may occur with  improper specimen collection/handling, submission of specimen other than nasopharyngeal swab, presence of viral mutation(s) within the areas targeted by this assay, and inadequate number of viral copies (<131 copies/mL). A negative result must be combined with clinical observations, patient history, and epidemiological information. The expected result is Negative. Fact Sheet for Patients:  11/28/19 Fact Sheet for Healthcare Providers:  https://www.moore.com/ This test is not yet ap proved or cleared by the https://www.young.biz/ FDA and  has been authorized for detection and/or diagnosis of SARS-CoV-2 by FDA under an Emergency  Use Authorization (EUA). This EUA will remain  in effect (meaning this test can be used) for the duration of the COVID-19 declaration under Section 564(b)(1) of the Act, 21 U.S.C. section 360bbb-3(b)(1), unless the authorization is terminated or revoked sooner.    Influenza A by PCR NEGATIVE NEGATIVE Final   Influenza B by PCR NEGATIVE NEGATIVE Final    Comment: (NOTE) The Xpert Xpress SARS-CoV-2/FLU/RSV assay is intended as an aid in  the diagnosis of influenza from Nasopharyngeal swab specimens and  should not be used as a sole basis for treatment. Nasal washings and  aspirates are unacceptable for Xpert Xpress SARS-CoV-2/FLU/RSV  testing. Fact Sheet for Patients: https://www.moore.com/ Fact Sheet for Healthcare Providers: https://www.young.biz/ This test is not yet approved or cleared by the [place] FDA and  has been authorized for detection and/or diagnosis of SARS-CoV-2 by  FDA under an Emergency Use Authorization (EUA). This EUA will remain  in effect (meaning this test can be used) for the duration of the  Covid-19 declaration under Section 564(b)(1) of the Act, 21  U.S.C. section 360bbb-3(b)(1), unless the authorization is  terminated or revoked. Performed at [facility], [address] [place], [place] [address]   Culture, blood (routine x 2)     Status: None (Preliminary result)   Collection Time: 09/30/19 10:31 PM   Specimen: Left Antecubital; Blood  Result Value Ref Range Status   Specimen Description LEFT ANTECUBITAL  Final   Special Requests Blood Culture adequate volume  Final   Culture   Final    NO GROWTH 3 DAYS Performed at [facility], [address], [place], [place] [address]    Report Status PENDING  Incomplete  Culture, blood (routine x 2)     Status: None (Preliminary result)   Collection Time: 09/30/19 10:36 PM   Specimen: BLOOD LEFT ARM  Result Value Ref Range Status   Specimen  Description BLOOD LEFT ARM  Final   Special Requests Blood Culture adequate volume  Final   Culture   Final    NO GROWTH 3 DAYS Performed at [facility], [address], [place], [place] [address]    Report Status PENDING  Incomplete         Radiology Studies: No results found.      Scheduled Meds: . chlorhexidine  15 mL Mouth Rinse BID  . Chlorhexidine Gluconate Cloth  6 each Topical Daily  . levothyroxine  75 mcg Intravenous QAC breakfast  . mouth rinse  15 mL Mouth Rinse q12n4p   Continuous Infusions: . cefTRIAXone (ROCEPHIN)  IV Stopped (10/02/19 2229)  . dextrose 5 % with KCl 20 mEq / L 75 mL/hr at 10/03/19 0939     LOS: 2 days    Time spent: 35 minutes    Alberteen Sam, MD [facility] 10/03/2019, 1:12 PM     Please page though AMION or Epic secure chat:  For password, contact charge nurse

## 2019-10-04 DIAGNOSIS — N390 Urinary tract infection, site not specified: Secondary | ICD-10-CM

## 2019-10-04 DIAGNOSIS — R339 Retention of urine, unspecified: Secondary | ICD-10-CM

## 2019-10-04 LAB — RENAL FUNCTION PANEL
Albumin: 1.7 g/dL — ABNORMAL LOW (ref 3.5–5.0)
Anion gap: 4 — ABNORMAL LOW (ref 5–15)
BUN: 42 mg/dL — ABNORMAL HIGH (ref 8–23)
CO2: 24 mmol/L (ref 22–32)
Calcium: 7.5 mg/dL — ABNORMAL LOW (ref 8.9–10.3)
Chloride: 118 mmol/L — ABNORMAL HIGH (ref 98–111)
Creatinine, Ser: 1.07 mg/dL — ABNORMAL HIGH (ref 0.44–1.00)
GFR calc Af Amer: 55 mL/min — ABNORMAL LOW (ref >60)
GFR calc non Af Amer: 47 mL/min — ABNORMAL LOW (ref >60)
Glucose, Bld: 122 mg/dL — ABNORMAL HIGH (ref 70–99)
Phosphorus: 1.3 mg/dL — ABNORMAL LOW (ref 2.5–4.6)
Potassium: 3.9 mmol/L (ref 3.5–5.1)
Sodium: 146 mmol/L — ABNORMAL HIGH (ref 135–145)

## 2019-10-04 LAB — CBC
HCT: 27.4 % — ABNORMAL LOW (ref 36.0–46.0)
Hemoglobin: 8.4 g/dL — ABNORMAL LOW (ref 12.0–15.0)
MCH: 28.5 pg (ref 26.0–34.0)
MCHC: 30.7 g/dL (ref 30.0–36.0)
MCV: 92.9 fL (ref 80.0–100.0)
Platelets: 159 10*3/uL (ref 150–400)
RBC: 2.95 MIL/uL — ABNORMAL LOW (ref 3.87–5.11)
RDW: 15.2 % (ref 11.5–15.5)
WBC: 8.4 10*3/uL (ref 4.0–10.5)
nRBC: 0 % (ref 0.0–0.2)

## 2019-10-04 MED ORDER — POTASSIUM PHOSPHATE MONOBASIC 500 MG PO TABS
500.0000 mg | ORAL_TABLET | Freq: Three times a day (TID) | ORAL | Status: DC
  Filled 2019-10-04 (×4): qty 1

## 2019-10-04 MED ORDER — DEXTROSE-NACL 5-0.45 % IV SOLN: INTRAVENOUS | Status: DC

## 2019-10-04 MED ORDER — POTASSIUM PHOSPHATES 15 MMOLE/5ML IV SOLN
30.0000 mmol | Freq: Once | INTRAVENOUS | Status: AC
  Administered 2019-10-04: 14:00:00 30 mmol via INTRAVENOUS
  Filled 2019-10-04: qty 10

## 2019-10-04 NOTE — Care Management Important Message (Signed)
Important Message  Patient Details  Name: Jaime [name] MRN: [id number] Date of Birth: 08/27/1932   Medicare Important Message Given:  Yes     [name] 10/04/2019, 10:42 AM

## 2019-10-04 NOTE — Progress Notes (Signed)
PROGRESS NOTE    Naiyana Barbian Lapage  TGG:[id number] DOB: 09/21/1932 DOA: 09/30/2019 PCP: Mickey Farber, MD      Brief Narrative:  Mrs. Couvillon is an 86 y.o. F with HTN, hypothyroidism and mild dementia home dwelling who presented with confusion and decreased responsiveness/sensorium.  Patient had recently fallen and had a proximal femur fracture, been rehabilitated, then fell again and had a distal femur fracture and being admitted at [facility].  She was in rehab for several weeks, and just discharged a few days before admission here.  Since discharge, the patient had been sleepier and less responsive than usual, until the day of admission when she was impossible to arouse.  In the ER, creatinine 10.98, BUN 207.  She was started on fluids for renal failure and admitted.          Assessment & Plan:  Acute renal failure Metabolic acidosis due to renal failure Uremia due to renal failure Hyperkalemia due to renal failure Patient had foley placed on admission, subsequent renal US normal with mild hydro.  CT abdomen showed moderate hydronephrosis.  Overnight Cr 10.9 > 8.4 > 3.8 > 1.77 > 1.07 today 1000cc UOP documented yesterday, excellent, BUN still elevated Acidosis resolved  -Continue IV fluids until reliably able to take PO -Continue Strict I/Os -Hold telmisartan -Avoid hypotension   Right hydronephrosis CT likely without stone.  She improved clincally with fluids and antibiotics.  Urology consulted, they have deferred stent or repeat imaging. -Consult Urology, appreciate cares  Hypokalemia Hypophosphatemia -Supplement K and phos  Possible UTI with sepsis Presented with hypotension, hypothermia, leukocytosis, altered mental status, and renal failure and elevated lactic acid.  WBC normalized.  Culture with Enterobacter cloacae.  Completed 5 days ceftriaxone.  Reviewed with ID RPh, disagree with Urology, CTX is adequate coverage for E cloacae, especially in a  patient clearly improved.     -Complete CTX, monitor fever curve  Hypernatremia IMproving -Continue hypotonic fluids, to correct at rate 0.5 mmol/L/hr -Trend BMP closely  Acute metabolic encephalopathy due to renal failure superimposed on dementia -PT eval -SLP eval  Atraumatic rhabdo CK resolved  Hypertension Normal -Hold ARB -Continue IV fuids  Hypothyroidism -Continue levothyroxine IV until able to take p.o.  Mood disorder -Hold mirtazapine until improved  Recent fracture of the femur  Elevated troponin Without chest pain, this is due to poor clearance due to renal failure, not ischemia.  No further work up necessary.  Anemia Stable, likely delusional  Severe protein calorie malnutrition As evidenced by diffuse loss of subcutaneous muscle mass and fat, poor oral intake, and low albumin.   Disposition: The patient was admitted with severe renal failure, acute metabolic encephalopathy from severe uremia, and possible infection with hydronephrosis.  Urology and nephrology have been consulted, and the patient's renal function is improving and she does not appear to have an impacted infection.  She remains encephalopathic.  Will need continued ongoing IV fluids.  Prognosis still undetermined, but concerning.         MDM: The below labs and imaging reports reviewed and summarized above.  Medication management as above.     DVT prophylaxis: SCDs Code Status: DNR Family Communication: Daughter by phone    Consultants:   Urology  Nephrology  Procedures:   US renal -- mild hydronephrosis, no stone  2/13 CT abdomen and pelvis-hydronephrosis, possible stone  Antimicrobials:   Ceftriaxone   Culture data:   2/13 blood culture x2 no growth  2/13 urine culture Enterobacter cloacae  Objective: Vitals:   10/03/19 2106 10/03/19 2109 10/04/19 0453 10/04/19 1206  BP: (!) 149/51  (!) 134/48 (!) 141/60  Pulse: 76 (!) 103 81 74   Resp: 20  20 18   Temp: 98.5 F (36.9 C)  (!) 97.5 F (36.4 C) (!) 97.5 F (36.4 C)  TempSrc: Oral  Oral   SpO2: (!) 63% 95% 100% 94%  Weight:      Height:        Intake/Output Summary (Last 24 hours) at 10/04/2019 1512 Last data filed at 10/04/2019 0927 Gross per 24 hour  Intake 1795.13 ml  Output 1000 ml  Net 795.13 ml   Filed Weights   09/30/19 2205  Weight: 71 kg    Examination: General appearance: Thin elderly adult female, slightly more alert today, still confused and mostly somnolent, in no obvious distress.   HEENT: Anicteric, conjunctiva pink, lids and lashes normal for age. No nasal deformity, discharge, epistaxis.  Lips dry, dentition dry and with caking on them, oropharynx dry, no oral lesions  Skin: Warm and dry.  No suspicious rashes or lesions. Cardiac: RRR, no murmurs appreciated.  No LE edema.    Respiratory: Normal respiratory rate and rhythm.  CTAB without rales or wheezes. Abdomen: Abdomen soft.  Mild nonfocal tenderness to palpation/grimace, no rebound, rigidity, or guarding. No ascites, distension, hepatosplenomegaly.   MSK: No deformities or effusions of the large joints of the upper or lower extremities bilaterally.  He is also subcutaneous muscle mass intact Neuro: More awake today, but still restless and inattentive.  Muscle tone diminished, moves upper extremities with generalized weakness, poor coordination, does not follow commands, no intelligible verbalizations.    Psych: Unable to assess attention diminished     Data Reviewed: I have personally reviewed following labs and imaging studies:  CBC: Recent Labs  Lab 09/30/19 2209 10/01/19 0415 10/02/19 0546 10/03/19 0551 10/04/19 0532  WBC 29.1* 24.0* 18.5* 9.6 8.4  NEUTROABS 26.6*  --   --   --   --   HGB 10.7* 9.7* 8.8* 8.2* 8.4*  HCT 35.3* 33.1* 29.3* 27.6* 27.4*  MCV 94.1 97.9 95.8 96.8 92.9  PLT 442* 282 227 148* 253   Basic Metabolic Panel: Recent Labs  Lab 09/30/19 2209  10/01/19 0415 10/02/19 0546 10/03/19 0551 10/04/19 0532  NA 149* 151* 154* 147* 146*  K 5.9* 4.5 3.7 3.1* 3.9  CL 108 120* 124* 120* 118*  CO2 19* 13* 17* 22 24  GLUCOSE 158* 142* 95 158* 122*  BUN 207* 176* 129* 79* 42*  CREATININE 10.98* 8.38* 3.84* 1.77* 1.07*  CALCIUM 8.9 7.2* 7.8* 7.5* 7.5*  PHOS  --   --  4.1 2.2* 1.3*   GFR: Estimated Creatinine Clearance: 38.8 mL/min (A) (by C-G formula based on SCr of 1.07 mg/dL (H)). Liver Function Tests: Recent Labs  Lab 09/30/19 2209 10/02/19 0546 10/03/19 0551 10/04/19 0532  AST 26  --   --   --   ALT 12  --   --   --   ALKPHOS 108  --   --   --   BILITOT 0.8  --   --   --   PROT 8.1  --   --   --   ALBUMIN 2.3* 1.8* 1.7* 1.7*   No results for input(s): LIPASE, AMYLASE in the last 168 hours. No results for input(s): AMMONIA in the last 168 hours. Coagulation Profile: No results for input(s): INR, PROTIME in the last 168 hours. Cardiac Enzymes: Recent  Labs  Lab 09/30/19 2209 10/02/19 0546 10/03/19 0551  CKTOTAL 418* 154 91   BNP (last 3 results) No results for input(s): PROBNP in the last 8760 hours. HbA1C: No results for input(s): HGBA1C in the last 72 hours. CBG: No results for input(s): GLUCAP in the last 168 hours. Lipid Profile: No results for input(s): CHOL, HDL, LDLCALC, TRIG, CHOLHDL, LDLDIRECT in the last 72 hours. Thyroid Function Tests: No results for input(s): TSH, T4TOTAL, FREET4, T3FREE, THYROIDAB in the last 72 hours. Anemia Panel: No results for input(s): VITAMINB12, FOLATE, FERRITIN, TIBC, IRON, RETICCTPCT in the last 72 hours. Urine analysis:    Component Value Date/Time   COLORURINE AMBER (A) 09/30/2019 2209   APPEARANCEUR TURBID (A) 09/30/2019 2209   LABSPEC 1.017 09/30/2019 2209   PHURINE 5.0 09/30/2019 2209   GLUCOSEU NEGATIVE 09/30/2019 2209   HGBUR SMALL (A) 09/30/2019 2209   BILIRUBINUR NEGATIVE 09/30/2019 2209   KETONESUR NEGATIVE 09/30/2019 2209   PROTEINUR 100 (A) 09/30/2019  2209   NITRITE NEGATIVE 09/30/2019 2209   LEUKOCYTESUR SMALL (A) 09/30/2019 2209   Sepsis Labs: @LABRCNTIP (procalcitonin:4,lacticacidven:4)  ) Recent Results (from the past 240 hour(s))  Urine culture     Status: Abnormal   Collection Time: 09/30/19 10:09 PM   Specimen: Urine, Random  Result Value Ref Range Status   Specimen Description   Final    URINE, RANDOM Performed at [facility], [address] [place], [place]    Special Requests   Final    NONE Performed at [facility], [address] [place], [place]    Culture 80,000 COLONIES/mL ENTEROBACTER CLOACAE (A)  Final   Report Status 10/03/2019 FINAL  Final   Organism ID, Bacteria ENTEROBACTER CLOACAE (A)  Final      Susceptibility   Enterobacter cloacae - MIC*    CEFAZOLIN >=64 RESISTANT Resistant     CIPROFLOXACIN <=0.25 SENSITIVE Sensitive     GENTAMICIN <=1 SENSITIVE Sensitive     IMIPENEM 1 SENSITIVE Sensitive     NITROFURANTOIN 32 SENSITIVE Sensitive     TRIMETH/SULFA <=20 SENSITIVE Sensitive     PIP/TAZO 16 SENSITIVE Sensitive     * 80,000 COLONIES/mL ENTEROBACTER CLOACAE  Respiratory Panel by RT PCR (Flu A&B, Covid) - Urine, Clean Catch     Status: None   Collection Time: 09/30/19 10:09 PM   Specimen: Urine, Clean Catch  Result Value Ref Range Status   SARS Coronavirus 2 by RT PCR NEGATIVE NEGATIVE Final    Comment: (NOTE) SARS-CoV-2 target nucleic acids are NOT DETECTED. The SARS-CoV-2 RNA is generally detectable in upper respiratoy specimens during the acute phase of infection. The lowest concentration of SARS-CoV-2 viral copies this assay can detect is 131 copies/mL. A negative result does not preclude SARS-Cov-2 infection and should not be used as the sole basis for treatment or other patient management decisions. A negative result may occur with  improper specimen collection/handling, submission of specimen other than nasopharyngeal swab, presence of viral  mutation(s) within the areas targeted by this assay, and inadequate number of viral copies (<131 copies/mL). A negative result must be combined with clinical observations, patient history, and epidemiological information. The expected result is Negative. Fact Sheet for Patients:  11/28/19 Fact Sheet for Healthcare Providers:  https://www.moore.com/ This test is not yet ap proved or cleared by the https://www.young.biz/ FDA and  has been authorized for detection and/or diagnosis of SARS-CoV-2 by FDA under an Emergency Use Authorization (EUA). This EUA will remain  in  effect (meaning this test can be used) for the duration of the COVID-19 declaration under Section 564(b)(1) of the Act, 21 U.S.C. section 360bbb-3(b)(1), unless the authorization is terminated or revoked sooner.    Influenza A by PCR NEGATIVE NEGATIVE Final   Influenza B by PCR NEGATIVE NEGATIVE Final    Comment: (NOTE) The Xpert Xpress SARS-CoV-2/FLU/RSV assay is intended as an aid in  the diagnosis of influenza from Nasopharyngeal swab specimens and  should not be used as a sole basis for treatment. Nasal washings and  aspirates are unacceptable for Xpert Xpress SARS-CoV-2/FLU/RSV  testing. Fact Sheet for Patients: https://www.moore.com/ Fact Sheet for Healthcare Providers: https://www.young.biz/ This test is not yet approved or cleared by the [place] FDA and  has been authorized for detection and/or diagnosis of SARS-CoV-2 by  FDA under an Emergency Use Authorization (EUA). This EUA will remain  in effect (meaning this test can be used) for the duration of the  Covid-19 declaration under Section 564(b)(1) of the Act, 21  U.S.C. section 360bbb-3(b)(1), unless the authorization is  terminated or revoked. Performed at [facility], [address] [place], [place] [address]   Culture, blood (routine x 2)     Status: None  (Preliminary result)   Collection Time: 09/30/19 10:31 PM   Specimen: Left Antecubital; Blood  Result Value Ref Range Status   Specimen Description LEFT ANTECUBITAL  Final   Special Requests Blood Culture adequate volume  Final   Culture   Final    NO GROWTH 4 DAYS Performed at [facility], [address], [place], [place] [address]    Report Status PENDING  Incomplete  Culture, blood (routine x 2)     Status: None (Preliminary result)   Collection Time: 09/30/19 10:36 PM   Specimen: BLOOD LEFT ARM  Result Value Ref Range Status   Specimen Description BLOOD LEFT ARM  Final   Special Requests Blood Culture adequate volume  Final   Culture   Final    NO GROWTH 4 DAYS Performed at [facility], [address] [place], [place] [address]    Report Status PENDING  Incomplete         Radiology Studies: No results found.      Scheduled Meds: . chlorhexidine  15 mL Mouth Rinse BID  . levothyroxine  75 mcg Intravenous QAC breakfast  . mouth rinse  15 mL Mouth Rinse q12n4p  . multivitamin with minerals  1 tablet Oral Daily   Continuous Infusions: . sodium chloride Stopped (10/03/19 2253)  . cefTRIAXone (ROCEPHIN)  IV Stopped (10/03/19 2210)  . dextrose 5 % and 0.45% NaCl 125 mL/hr at 10/04/19 0921  . potassium PHOSPHATE IVPB (in mmol) 30 mmol (10/04/19 1417)     LOS: 3 days    Time spent: 35 minutes     Alberteen Sam, MD [facility] 10/04/2019, 3:12 PM     Please page though AMION or Epic secure chat:  For password, contact charge nurse

## 2019-10-04 NOTE — TOC Initial Note (Signed)
Transition of Care [facility]) - Initial/Assessment Note    Patient Details  Name: Jaime [name] MRN: [id number] Date of Birth: 07/21/1933  [facility]) CM/SW Contact:    Beverly Sessions, RN Phone Number: 10/04/2019, 10:48 AM  Clinical Narrative:                 Patient admitted from home with sepsis  Assessment completed via phone with daughter Jaime [name]  1/3 patient was admitted to hospital after a fall.  At discharge went to [facility]. 1 day after being at [facility] patient sustained another fall, and return back to the hospital.  At discharge patient went to [facility]  Patient was discharge from [facility] health care 2/12.  Was found to have AMS and admitted to the hospital 2/12  Daughter states that prior to January patient had no mobility barriers, however her "mental status has been progressing"  Daughter states that patient lives in her own home with private pay 24/7 care  Patient has a hospital bed, WC and temporary ramp in the home  PT current recommendations SNF, however yesterday patient was unable to follow commands  RNCM will follow for patient's progression  Daughter states if it is possible they would like patient to return home with 24/7 care, and home health if applicable.  The family feels like she will do better in her home environment vs going to rehab again    Expected Discharge Plan: [place]     Patient Goals and CMS Choice        Expected Discharge Plan and Services Expected Discharge Plan: [place]   Discharge Planning Services: CM Consult                                          Prior Living Arrangements/Services   Lives with:: Other (Comment)(24/7 caregivers)              Current home services: DME    Activities of Daily Living Home Assistive Devices/Equipment: Environmental consultant (specify type) ADL Screening  (condition at time of admission) Patient's cognitive ability adequate to safely complete daily activities?: No Is the patient deaf or have difficulty hearing?: No Does the patient have difficulty seeing, even when wearing glasses/contacts?: Yes Does the patient have difficulty concentrating, remembering, or making decisions?: Yes Patient able to express need for assistance with ADLs?: Yes Does the patient have difficulty dressing or bathing?: Yes Independently performs ADLs?: No Communication: Independent Dressing (OT): Dependent Is this a change from baseline?: Change from baseline, expected to last >3 days Grooming: Dependent Is this a change from baseline?: Change from baseline, expected to last >3 days Feeding: Independent Bathing: Needs assistance Is this a change from baseline?: Change from baseline, expected to last >3 days Toileting: Needs assistance Is this a change from baseline?: Change from baseline, expected to last >3days In/Out Bed: Needs assistance Is this a change from baseline?: Change from baseline, expected to last >3 days Walks in Home: Needs assistance Is this a change from baseline?: Change from baseline, expected to last >3 days Does the patient have difficulty walking or climbing stairs?: Yes Weakness of Legs: Left Weakness of Arms/Hands: None  Permission Sought/Granted                  Emotional Assessment  Admission diagnosis:  Dehydration [E86.0] Encephalopathy [G93.40] Acute kidney injury (HCC) [N17.9] Sepsis (HCC) [A41.9] Acute renal failure, unspecified acute renal failure type (HCC) [N17.9] Urinary tract infection in elderly patient [N39.0] Patient Active Problem List   Diagnosis Date Noted  . Sepsis (HCC) 10/01/2019  . Essential hypertension 08/27/2019  . Closed fracture of left distal femur (HCC) 08/26/2019  . Normocytic anemia 08/26/2019  . Fall at home, initial encounter 08/26/2019  . Closed fracture of left distal  femur (HCC) 08/24/2019  . Intertrochanteric fracture (HCC) 08/21/2019  . Hypothyroidism 08/21/2019  . Protein-calorie malnutrition, severe 04/20/2018  . Dementia with behavioral disturbance (HCC) 04/20/2018   PCP:  Mickey Farber, MD Pharmacy:   [facility] [address], [place] - [address] [place] [address] Phone: [phone] Fax: [phone]     Social Determinants of Health (SDOH) Interventions    Readmission Risk Interventions Readmission Risk Prevention Plan 10/04/2019 04/21/2018  Post Dischage Appt - Complete  Medication Screening - Complete  Transportation Screening Complete Complete  PCP follow-up - Complete  Medication Review (RN Care Manager) Complete -  Some recent data might be hidden

## 2019-10-04 NOTE — Progress Notes (Signed)
Urology Inpatient Progress Note  Subjective: Jaime [name] is a 86 y.o. female with PMH dementia and recent falls requiring hospitalizations for orthopedic repair admitted on 09/30/2019 with AMS and subsequently found to have UTI and likely urosepsis with urinary retention and associated AKI.   Urine culture resulted with cefazolin resistant Enterobacter cloacae.  Blood cultures pending with no growth at 3 days.  On antibiotics as below.  Creatinine continues to downtrend, today 1.87.  WBC count also down today, 8.4.  Patient remains somnolent today.  Anti-infectives: Anti-infectives (From admission, onward)   Start     Dose/Rate Route Frequency Ordered Stop   10/01/19 2200  cefTRIAXone (ROCEPHIN) 1 g in sodium chloride 0.9 % 100 mL IVPB     1 g 200 mL/hr over 30 Minutes Intravenous Every 24 hours 10/01/19 0037 10/04/19 2359   09/30/19 2215  cefTRIAXone (ROCEPHIN) 1 g in sodium chloride 0.9 % 100 mL IVPB     1 g 200 mL/hr over 30 Minutes Intravenous  Once 09/30/19 2208 09/30/19 2319      Current Facility-Administered Medications  Medication Dose Route Frequency Provider Last Rate Last Admin  . 0.9 %  sodium chloride infusion   Intravenous PRN Alberteen Sam, MD   Stopped at 10/03/19 2253  . acetaminophen (TYLENOL) tablet 650 mg  650 mg Oral Q6H PRN Mansy, Jan A, MD       Or  . acetaminophen (TYLENOL) suppository 650 mg  650 mg Rectal Q6H PRN Mansy, Jan A, MD      . cefTRIAXone (ROCEPHIN) 1 g in sodium chloride 0.9 % 100 mL IVPB  1 g Intravenous Q24H Alberteen Sam, MD   Stopped at 10/03/19 2210  . chlorhexidine (PERIDEX) 0.12 % solution 15 mL  15 mL Mouth Rinse BID Alberteen Sam, MD   15 mL at 10/04/19 0913  . dextrose 5 %-0.45 % sodium chloride infusion   Intravenous Continuous Alberteen Sam, MD 125 mL/hr at 10/04/19 0921 New Bag at 10/04/19 0921  . levothyroxine (SYNTHROID, LEVOTHROID) injection 75 mcg  75 mcg Intravenous QAC breakfast  Alberteen Sam, MD   75 mcg at 10/04/19 0604  . MEDLINE mouth rinse  15 mL Mouth Rinse q12n4p Danford, Earl Lites, MD   15 mL at 10/03/19 1445  . multivitamin with minerals tablet 1 tablet  1 tablet Oral Daily Danford, Earl Lites, MD      . ondansetron [facility]) tablet 4 mg  4 mg Oral Q6H PRN Mansy, Jan A, MD       Or  . ondansetron [facility]) injection 4 mg  4 mg Intravenous Q6H PRN Mansy, Jan A, MD      . potassium PHOSPHATE 30 mmol in dextrose 5 % 500 mL infusion  30 mmol Intravenous Once Danford, Earl Lites, MD       Objective: Vital signs in last 24 hours: Temp:  [97.5 F (36.4 C)-98.5 F (36.9 C)] 97.5 F (36.4 C) (02/16 0453) Pulse Rate:  [76-103] 81 (02/16 0453) Resp:  [20] 20 (02/16 0453) BP: (127-149)/(48-51) 134/48 (02/16 0453) SpO2:  [63 %-100 %] 100 % (02/16 0453)  Intake/Output from previous day: 02/15 0701 - 02/16 0700 In: 2148.1 [I.V.:1663; IV Piggyback:485.1] Out: 1000 [Urine:1000] Intake/Output this shift: Total I/O In: 0  Out: 600 [Urine:600]  Physical Exam Vitals and nursing note reviewed.  Constitutional:      General: She is not in acute distress.    Appearance: She is not ill-appearing, toxic-appearing or diaphoretic.  HENT:  Head: Normocephalic and atraumatic.  Pulmonary:     Effort: Pulmonary effort is normal. No respiratory distress.  Skin:    General: Skin is warm and dry.  Neurological:     Mental Status: Mental status is at baseline.    Lab Results:  Recent Labs    10/03/19 0551 10/04/19 0532  WBC 9.6 8.4  HGB 8.2* 8.4*  HCT 27.6* 27.4*  PLT 148* 159   BMET Recent Labs    10/03/19 0551 10/04/19 0532  NA 147* 146*  K 3.1* 3.9  CL 120* 118*  CO2 22 24  GLUCOSE 158* 122*  BUN 79* 42*  CREATININE 1.77* 1.07*  CALCIUM 7.5* 7.5*   Assessment & Plan: 86 year old female admitted with urinary retention and AKI as well as UTI with likely urosepsis, clinically improving on empiric antibiotics and fluid  resuscitation.  Urine culture resulted with cefazolin resistant Enterobacter, isolate not tested against later generation cephalosporins.  While patient does appear to be improving on ceftriaxone, recommend switching her to culture-directed therapy at this time.  We will continue to defer further imaging or intervention given clinical improvement.  Debroah Loop, PA-C 10/04/2019

## 2019-10-04 NOTE — Progress Notes (Signed)
PT Cancellation Note  Patient Details Name: Jaime [name] MRN: [id number] DOB: 1933/01/06   Cancelled Treatment:     PT attempt. Medical Hold. Pt continues to be lethargic and unable to follow simple one step commands. Gets slightly agitated when attempted to move however eyes closed mostly. PT will continue to follow per POC and re-attempt when pt is able to cooperate/ participate.    Rushie Chestnut 10/04/2019, 2:33 PM

## 2019-10-04 NOTE — Progress Notes (Signed)
SLP Cancellation Note  Patient Details Name: Jaime [name] MRN: [id number] DOB: 03/03/1933   Cancelled treatment:       Reason Eval/Treat Not Completed: Medical issues which prohibited therapy;Patient not medically ready(chart reviewed; consulted NSG re: pt's status).  Pt continues to have oral discomfort and deficits of increased oral secretions both dried and oozing/bleeding; poor oral care and Dentition status. Pt is not opening mouth for oral care in hopes to clear and treat mouth. Pt often turns head away to Nursing's attempts at oral care. She has endorsed oral pain to stimulation of swabs, ice chips. Pt often keeps eyes closed and is somnolent per MD note.  Pt remains on a dysphagia diet but is not indicating any desire for oral intake. Pt is at increased risk for aspiration of own secretions/saliva w/ risk for pulmonary decline d/t the poor status of her oral cavity. Concern for pt's ability to meet nutrition/hydration needs adequately and safely is present. Recommend a Palliative Care consult for GOC; also consideration of further intervention to treat mouth of any bleeding or sores in hopes pt can resume comfort w/ po intake. Recommend Dietician f/u for support.  ST services will continue to monitor pt's status while admitted; assess toleration of oral intake.      Jerilynn Som, MS, CCC-SLP [name] 10/04/2019, 3:35 PM

## 2019-10-04 NOTE — Progress Notes (Signed)
Daughter-in-law called, Teressa Mcglocklin for update. Update given. Mrs. Caras requesting someone from CSW/CNM to call her at [phone] to answer questions. Advised Mrs. Buikema I would pass the message to CSW/CNM to return her call.   Madie Reno, RN

## 2019-10-04 NOTE — Progress Notes (Signed)
PT Cancellation Note  Patient Details Name: Jaime [name] MRN: [id number] DOB: [birth year]   Cancelled Treatment:     PT attempt. PT hold. Pt very lethargic and unable to follow simple one step commands. She did awake and attempted conversation but not able to be understood and drifted back to sleep. Therapist will return later this date and try to progress  PT as able per pt tolerance.     Rushie Chestnut 10/04/2019, 11:04 AM

## 2019-10-05 LAB — PHOSPHORUS: Phosphorus: 2.5 mg/dL (ref 2.5–4.6)

## 2019-10-05 LAB — RENAL FUNCTION PANEL
Albumin: 1.8 g/dL — ABNORMAL LOW (ref 3.5–5.0)
Anion gap: 9 (ref 5–15)
BUN: 24 mg/dL — ABNORMAL HIGH (ref 8–23)
CO2: 18 mmol/L — ABNORMAL LOW (ref 22–32)
Calcium: 6.9 mg/dL — ABNORMAL LOW (ref 8.9–10.3)
Chloride: 114 mmol/L — ABNORMAL HIGH (ref 98–111)
Creatinine, Ser: 0.7 mg/dL (ref 0.44–1.00)
GFR calc Af Amer: >60 mL/min (ref >60)
GFR calc non Af Amer: >60 mL/min (ref >60)
Glucose, Bld: 122 mg/dL — ABNORMAL HIGH (ref 70–99)
Phosphorus: 2.5 mg/dL (ref 2.5–4.6)
Potassium: 3.7 mmol/L (ref 3.5–5.1)
Sodium: 141 mmol/L (ref 135–145)

## 2019-10-05 LAB — BASIC METABOLIC PANEL
Anion gap: 9 (ref 5–15)
BUN: 23 mg/dL (ref 8–23)
CO2: 18 mmol/L — ABNORMAL LOW (ref 22–32)
Calcium: 6.9 mg/dL — ABNORMAL LOW (ref 8.9–10.3)
Chloride: 114 mmol/L — ABNORMAL HIGH (ref 98–111)
Creatinine, Ser: 0.7 mg/dL (ref 0.44–1.00)
GFR calc Af Amer: >60 mL/min (ref >60)
GFR calc non Af Amer: >60 mL/min (ref >60)
Glucose, Bld: 128 mg/dL — ABNORMAL HIGH (ref 70–99)
Potassium: 3.6 mmol/L (ref 3.5–5.1)
Sodium: 141 mmol/L (ref 135–145)

## 2019-10-05 LAB — CULTURE, BLOOD (ROUTINE X 2)
Culture: NO GROWTH
Culture: NO GROWTH
Special Requests: ADEQUATE
Special Requests: ADEQUATE

## 2019-10-05 LAB — CBC
HCT: 29.7 % — ABNORMAL LOW (ref 36.0–46.0)
Hemoglobin: 8.9 g/dL — ABNORMAL LOW (ref 12.0–15.0)
MCH: 28.3 pg (ref 26.0–34.0)
MCHC: 30 g/dL (ref 30.0–36.0)
MCV: 94.3 fL (ref 80.0–100.0)
Platelets: 144 10*3/uL — ABNORMAL LOW (ref 150–400)
RBC: 3.15 MIL/uL — ABNORMAL LOW (ref 3.87–5.11)
RDW: 15.5 % (ref 11.5–15.5)
WBC: 10.1 10*3/uL (ref 4.0–10.5)
nRBC: 0 % (ref 0.0–0.2)

## 2019-10-05 LAB — MAGNESIUM: Magnesium: 1.2 mg/dL — ABNORMAL LOW (ref 1.7–2.4)

## 2019-10-05 MED ORDER — CHLORHEXIDINE GLUCONATE CLOTH 2 % EX PADS
6.0000 | MEDICATED_PAD | Freq: Every day | CUTANEOUS | Status: DC
  Administered 2019-10-06 – 2019-10-09 (×4): 6 via TOPICAL

## 2019-10-05 MED ORDER — SODIUM BICARBONATE 650 MG PO TABS
650.0000 mg | ORAL_TABLET | Freq: Three times a day (TID) | ORAL | Status: AC
  Administered 2019-10-05 – 2019-10-07 (×6): 650 mg via ORAL
  Filled 2019-10-05 (×6): qty 1

## 2019-10-05 MED ORDER — TAMSULOSIN HCL 0.4 MG PO CAPS
0.4000 mg | ORAL_CAPSULE | Freq: Every day | ORAL | Status: DC
  Administered 2019-10-05 – 2019-10-10 (×6): 0.4 mg via ORAL
  Filled 2019-10-05 (×6): qty 1

## 2019-10-05 MED ORDER — MAGNESIUM SULFATE 2 GM/50ML IV SOLN
2.0000 g | INTRAVENOUS | Status: AC
  Administered 2019-10-05 (×2): 2 g via INTRAVENOUS
  Filled 2019-10-05 (×3): qty 50

## 2019-10-05 NOTE — Progress Notes (Signed)
Urine output was 1450 mL

## 2019-10-05 NOTE — Progress Notes (Addendum)
PROGRESS NOTE    Jaime [name]  ZOX:[id number] DOB: 24-Nov-1932 DOA: 09/30/2019 PCP: Jaime Farber, MD      Brief Narrative:  Jaime [name] is an 86 y.o. F with HTN, hypothyroidism and mild dementia home dwelling who presented with confusion and decreased responsiveness/sensorium.  Patient had recently fallen and had a proximal femur fracture, been rehabilitated, then fell again and had a distal femur fracture and being admitted at [facility].  She was in rehab for several weeks, and just discharged a few days before admission here.  Since discharge, the patient had been sleepier and less responsive than usual, until the day of admission when she was impossible to arouse.  In the ER, creatinine 10.98, BUN 207.  She was started on fluids for renal failure and admitted.       Assessment & Plan:  Acute renal failure Metabolic acidosis due to renal failure Uremia due to renal failure Hyperkalemia due to renal failure Patient had foley placed on admission, subsequent renal US normal with mild hydro.  CT abdomen showed moderate hydronephrosis.  Overnight Cr 10.9 > 8.4 > 3.8 > 1.77 > 0.7 today Still Mild Acidosis resolved Patient was retaining urine >1000 ml on 10/05/2019, Foley catheter was re- inserted -Creatinine improved, decrease IV fluid.  Due to decreased p.o. intake.   -Continue Strict I/Os -Hold telmisartan -Avoid hypotension   Right hydronephrosis CT likely without stone.  She improved clincally with fluids and antibiotics.  Urology consulted, they have deferred stent or repeat imaging. -Consult Urology, appreciate cares 2/17 Urine retention, Foley catheter reinserted  2/17 Started Flomax 0.4 mg p.o. daily  Hypokalemia Hypophosphatemia -Supplement K and phos  Hypomagnesemia, monitor replete as needed  Possible UTI with sepsis Presented with hypotension, hypothermia, leukocytosis, altered mental status, and renal failure and elevated lactic acid.  WBC  normalized.  Culture with Enterobacter cloacae.  Completed 5 days ceftriaxone.  Reviewed with ID RPh, disagree with Urology, CTX is adequate coverage for E cloacae, especially in a patient clearly improved.     -Complete CTX, monitor fever curve  Hypernatremia, Resolved IMproving -Continue hypotonic fluids, to correct at rate 0.5 mmol/L/hr -Trend BMP closely  Acute metabolic encephalopathy due to renal failure superimposed on dementia -PT eval -SLP eval  Atraumatic rhabdo CK resolved  Hypertension Normal -Hold ARB -Continue IV fuids  Hypothyroidism -Continue levothyroxine IV until able to take p.o.  Mood disorder -Hold mirtazapine until improved  Recent fracture of the femur  Elevated troponin Without chest pain, this is due to poor clearance due to renal failure, not ischemia.  No further work up necessary.  Anemia Stable, likely delusional  Severe protein calorie malnutrition As evidenced by diffuse loss of subcutaneous muscle mass and fat, poor oral intake, and low albumin.   Disposition: The patient was admitted with severe renal failure, acute metabolic encephalopathy from severe uremia, and possible infection with hydronephrosis.  Urology and nephrology have been consulted, and the patient's renal function improved, AKI resolved, patient is retaining urine so Foley catheter was inserted.   She remains encephalopathic.  Will need continued ongoing maintenance IV fluids.  Prognosis still undetermined, but concerning. May need couple of more days to improve   MDM: The below labs and imaging reports reviewed and summarized above.  Medication management as above.     DVT prophylaxis: SCDs Code Status: DNR Family Communication: Nobody available at bedside, previous provider updated daughter by phone    Consultants:   Urology  Nephrology  Procedures:   US  renal -- mild hydronephrosis, no stone  2/13 CT abdomen and pelvis-hydronephrosis, possible  stone  Antimicrobials:   Ceftriaxone   Culture data:   2/13 blood culture x2 no growth  2/13 urine culture Enterobacter cloacae   Subjective Patient was seen and examined at the bedside, no overnight issues. Patient still remains AO x1, mumbling, very sleepy and lethargic.  Moving upper extremities spontaneously, unable to follow commands.  Objective: Vitals:   10/04/19 1206 10/04/19 2140 10/05/19 0629 10/05/19 1122  BP: (!) 141/60 (!) 142/60 (!) 133/55 133/70  Pulse: 74 96 87 92  Resp: 18 (!) 22 20   Temp: (!) 97.5 F (36.4 C) 98.1 F (36.7 C) (!) 97.4 F (36.3 C) (!) 97.4 F (36.3 C)  TempSrc:  Oral Oral Oral  SpO2: 94% 100% 100% 95%  Weight:      Height:        Intake/Output Summary (Last 24 hours) at 10/05/2019 1712 Last data filed at 10/05/2019 0443 Gross per 24 hour  Intake 2462.39 ml  Output --  Net 2462.39 ml   Filed Weights   09/30/19 2205  Weight: 71 kg    Examination: General appearance: Thin elderly adult female, slightly more alert today, still confused and mostly somnolent, in no obvious distress.   HEENT: Anicteric, conjunctiva pink, lids and lashes normal for age. No nasal deformity, discharge, epistaxis.  Lips dry, dentition dry and with caking on them, oropharynx dry, no oral lesions  Skin: Warm and dry.  No suspicious rashes or lesions. Cardiac: RRR, no murmurs appreciated.  No LE edema.    Respiratory: Normal respiratory rate and rhythm.  CTAB without rales or wheezes. Abdomen: Abdomen soft.  Mild nonfocal tenderness to palpation/grimace, no rebound, rigidity, or guarding. No ascites, distension, hepatosplenomegaly.   MSK: No deformities or effusions of the large joints of the upper or lower extremities bilaterally.  He is also subcutaneous muscle mass intact Neuro: [name] is sleepy, AO x1, unable to follow any commands and she was trying to talk but mumbling unable to understand.  Spontaneously moving upper extremities, did not notice lower  extremity movement, seems very lethargic. Psych: Unable to assess attention diminished     Data Reviewed: I have personally reviewed following labs and imaging studies:  CBC: Recent Labs  Lab 09/30/19 2209 09/30/19 2209 10/01/19 0415 10/02/19 0546 10/03/19 0551 10/04/19 0532 10/05/19 0908  WBC 29.1*   < > 24.0* 18.5* 9.6 8.4 10.1  NEUTROABS 26.6*  --   --   --   --   --   --   HGB 10.7*   < > 9.7* 8.8* 8.2* 8.4* 8.9*  HCT 35.3*   < > 33.1* 29.3* 27.6* 27.4* 29.7*  MCV 94.1   < > 97.9 95.8 96.8 92.9 94.3  PLT 442*   < > 282 227 148* 159 144*   < > = values in this interval not displayed.   Basic Metabolic Panel: Recent Labs  Lab 10/02/19 0546 10/03/19 0551 10/04/19 0532 10/05/19 0722 10/05/19 0908  NA 154* 147* 146* 141 141  K 3.7 3.1* 3.9 3.7 3.6  CL 124* 120* 118* 114* 114*  CO2 17* 22 24 18* 18*  GLUCOSE 95 158* 122* 122* 128*  BUN 129* 79* 42* 24* 23  CREATININE 3.84* 1.77* 1.07* 0.70 0.70  CALCIUM 7.8* 7.5* 7.5* 6.9* 6.9*  MG  --   --   --   --  1.2*  PHOS 4.1 2.2* 1.3* 2.5 2.5   GFR: Estimated  Creatinine Clearance: 51.9 mL/min (by C-G formula based on SCr of 0.7 mg/dL). Liver Function Tests: Recent Labs  Lab 09/30/19 2209 10/02/19 0546 10/03/19 0551 10/04/19 0532 10/05/19 0722  AST 26  --   --   --   --   ALT 12  --   --   --   --   ALKPHOS 108  --   --   --   --   BILITOT 0.8  --   --   --   --   PROT 8.1  --   --   --   --   ALBUMIN 2.3* 1.8* 1.7* 1.7* 1.8*   No results for input(s): LIPASE, AMYLASE in the last 168 hours. No results for input(s): AMMONIA in the last 168 hours. Coagulation Profile: No results for input(s): INR, PROTIME in the last 168 hours. Cardiac Enzymes: Recent Labs  Lab 09/30/19 2209 10/02/19 0546 10/03/19 0551  CKTOTAL 418* 154 91   BNP (last 3 results) No results for input(s): PROBNP in the last 8760 hours. HbA1C: No results for input(s): HGBA1C in the last 72 hours. CBG: No results for input(s): GLUCAP in  the last 168 hours. Lipid Profile: No results for input(s): CHOL, HDL, LDLCALC, TRIG, CHOLHDL, LDLDIRECT in the last 72 hours. Thyroid Function Tests: No results for input(s): TSH, T4TOTAL, FREET4, T3FREE, THYROIDAB in the last 72 hours. Anemia Panel: No results for input(s): VITAMINB12, FOLATE, FERRITIN, TIBC, IRON, RETICCTPCT in the last 72 hours. Urine analysis:    Component Value Date/Time   COLORURINE AMBER (A) 09/30/2019 2209   APPEARANCEUR TURBID (A) 09/30/2019 2209   LABSPEC 1.017 09/30/2019 2209   PHURINE 5.0 09/30/2019 2209   GLUCOSEU NEGATIVE 09/30/2019 2209   HGBUR SMALL (A) 09/30/2019 2209   BILIRUBINUR NEGATIVE 09/30/2019 2209   KETONESUR NEGATIVE 09/30/2019 2209   PROTEINUR 100 (A) 09/30/2019 2209   NITRITE NEGATIVE 09/30/2019 2209   LEUKOCYTESUR SMALL (A) 09/30/2019 2209   Sepsis Labs: @LABRCNTIP (procalcitonin:4,lacticacidven:4)  ) Recent Results (from the past 240 hour(s))  Urine culture     Status: Abnormal   Collection Time: 09/30/19 10:09 PM   Specimen: Urine, Random  Result Value Ref Range Status   Specimen Description   Final    URINE, RANDOM Performed at [facility], [address] [place], [place]    Special Requests   Final    NONE Performed at [facility], [address] [place], [place]    Culture 80,000 COLONIES/mL ENTEROBACTER CLOACAE (A)  Final   Report Status 10/03/2019 FINAL  Final   Organism ID, Bacteria ENTEROBACTER CLOACAE (A)  Final      Susceptibility   Enterobacter cloacae - MIC*    CEFAZOLIN >=64 RESISTANT Resistant     CIPROFLOXACIN <=0.25 SENSITIVE Sensitive     GENTAMICIN <=1 SENSITIVE Sensitive     IMIPENEM 1 SENSITIVE Sensitive     NITROFURANTOIN 32 SENSITIVE Sensitive     TRIMETH/SULFA <=20 SENSITIVE Sensitive     PIP/TAZO 16 SENSITIVE Sensitive     * 80,000 COLONIES/mL ENTEROBACTER CLOACAE  Respiratory Panel by RT PCR (Flu A&B, Covid) - Urine, Clean Catch     Status: None    Collection Time: 09/30/19 10:09 PM   Specimen: Urine, Clean Catch  Result Value Ref Range Status   SARS Coronavirus 2 by RT PCR NEGATIVE NEGATIVE Final    Comment: (NOTE) SARS-CoV-2 target nucleic acids are NOT DETECTED. The SARS-CoV-2 RNA is generally detectable in upper respiratoy specimens  during the acute phase of infection. The lowest concentration of SARS-CoV-2 viral copies this assay can detect is 131 copies/mL. A negative result does not preclude SARS-Cov-2 infection and should not be used as the sole basis for treatment or other patient management decisions. A negative result may occur with  improper specimen collection/handling, submission of specimen other than nasopharyngeal swab, presence of viral mutation(s) within the areas targeted by this assay, and inadequate number of viral copies (<131 copies/mL). A negative result must be combined with clinical observations, patient history, and epidemiological information. The expected result is Negative. Fact Sheet for Patients:  https://www.moore.com/ Fact Sheet for Healthcare Providers:  https://www.young.biz/ This test is not yet ap proved or cleared by the [place] FDA and  has been authorized for detection and/or diagnosis of SARS-CoV-2 by FDA under an Emergency Use Authorization (EUA). This EUA will remain  in effect (meaning this test can be used) for the duration of the COVID-19 declaration under Section 564(b)(1) of the Act, 21 U.S.C. section 360bbb-3(b)(1), unless the authorization is terminated or revoked sooner.    Influenza A by PCR NEGATIVE NEGATIVE Final   Influenza B by PCR NEGATIVE NEGATIVE Final    Comment: (NOTE) The Xpert Xpress SARS-CoV-2/FLU/RSV assay is intended as an aid in  the diagnosis of influenza from Nasopharyngeal swab specimens and  should not be used as a sole basis for treatment. Nasal washings and  aspirates are unacceptable for Xpert Xpress  SARS-CoV-2/FLU/RSV  testing. Fact Sheet for Patients: https://www.moore.com/ Fact Sheet for Healthcare Providers: https://www.young.biz/ This test is not yet approved or cleared by the [place] FDA and  has been authorized for detection and/or diagnosis of SARS-CoV-2 by  FDA under an Emergency Use Authorization (EUA). This EUA will remain  in effect (meaning this test can be used) for the duration of the  Covid-19 declaration under Section 564(b)(1) of the Act, 21  U.S.C. section 360bbb-3(b)(1), unless the authorization is  terminated or revoked. Performed at [facility], [address] [place], [place] [address]   Culture, blood (routine x 2)     Status: None   Collection Time: 09/30/19 10:31 PM   Specimen: Left Antecubital; Blood  Result Value Ref Range Status   Specimen Description LEFT ANTECUBITAL  Final   Special Requests Blood Culture adequate volume  Final   Culture   Final    NO GROWTH 5 DAYS Performed at [facility], [address], [place], [place] [address]    Report Status 10/05/2019 FINAL  Final  Culture, blood (routine x 2)     Status: None   Collection Time: 09/30/19 10:36 PM   Specimen: BLOOD LEFT ARM  Result Value Ref Range Status   Specimen Description BLOOD LEFT ARM  Final   Special Requests Blood Culture adequate volume  Final   Culture   Final    NO GROWTH 5 DAYS Performed at [facility], [address], [place], [place] [address]    Report Status 10/05/2019 FINAL  Final         Radiology Studies: No results found.      Scheduled Meds: . chlorhexidine  15 mL Mouth Rinse BID  . levothyroxine  75 mcg Intravenous QAC breakfast  . mouth rinse  15 mL Mouth Rinse q12n4p  . multivitamin with minerals  1 tablet Oral Daily  . sodium bicarbonate  650 mg Oral TID   Continuous Infusions: . sodium chloride Stopped (10/05/19 0052)  . dextrose 5 % and 0.45%  NaCl 125 mL/hr at  10/05/19 1124  . magnesium sulfate bolus IVPB 2 g (10/05/19 1500)     LOS: 4 days    Time spent: 35 minutes     Gillis Santa, MD [facility] 10/05/2019, 5:12 PM     Please page though AMION or Epic secure chat:  For password, contact charge nurse

## 2019-10-05 NOTE — Progress Notes (Signed)
Urology Consult Follow Up  Subjective: Patient with continued confusion and decreased responsiveness and sensorium.  Culture with Enterobacter cloacae.  Completed 5 days of ceftriaxone.  Hospitalist reviewed with ID RPh and CTX was adequately covered.  Culture treatment is completed and fever curve will be continued to be monitored.  Morning labs are pending.  But yesterday's lab demonstrated a decrease in WBC count and a decrease in serum creatinine.  Anti-infectives: Anti-infectives (From admission, onward)   Start     Dose/Rate Route Frequency Ordered Stop   10/01/19 2200  cefTRIAXone (ROCEPHIN) 1 g in sodium chloride 0.9 % 100 mL IVPB     1 g 200 mL/hr over 30 Minutes Intravenous Every 24 hours 10/01/19 0037 10/04/19 2206   09/30/19 2215  cefTRIAXone (ROCEPHIN) 1 g in sodium chloride 0.9 % 100 mL IVPB     1 g 200 mL/hr over 30 Minutes Intravenous  Once 09/30/19 2208 09/30/19 2319      Current Facility-Administered Medications  Medication Dose Route Frequency Provider Last Rate Last Admin  . 0.9 %  sodium chloride infusion   Intravenous PRN Edwin Dada, MD   Stopped at 10/05/19 0052  . acetaminophen (TYLENOL) tablet 650 mg  650 mg Oral Q6H PRN Mansy, Jan A, MD       Or  . acetaminophen (TYLENOL) suppository 650 mg  650 mg Rectal Q6H PRN Mansy, Jan A, MD      . chlorhexidine (PERIDEX) 0.12 % solution 15 mL  15 mL Mouth Rinse BID Edwin Dada, MD   15 mL at 10/04/19 2136  . dextrose 5 %-0.45 % sodium chloride infusion   Intravenous Continuous Edwin Dada, MD 125 mL/hr at 10/05/19 0443 Rate Verify at 10/05/19 0443  . levothyroxine (SYNTHROID, LEVOTHROID) injection 75 mcg  75 mcg Intravenous QAC breakfast Edwin Dada, MD   75 mcg at 10/05/19 0514  . MEDLINE mouth rinse  15 mL Mouth Rinse q12n4p Danford, Suann Larry, MD   15 mL at 10/03/19 1445  . multivitamin with minerals tablet 1 tablet  1 tablet Oral Daily Danford, Suann Larry, MD       . ondansetron [facility]) tablet 4 mg  4 mg Oral Q6H PRN Mansy, Jan A, MD       Or  . ondansetron [facility]) injection 4 mg  4 mg Intravenous Q6H PRN Mansy, Jan A, MD         Objective: Vital signs in last 24 hours: Temp:  [97.4 F (36.3 C)-98.1 F (36.7 C)] 97.4 F (36.3 C) (02/17 0629) Pulse Rate:  [74-96] 87 (02/17 0629) Resp:  [18-22] 20 (02/17 0629) BP: (133-142)/(55-60) 133/55 (02/17 0629) SpO2:  [94 %-100 %] 100 % (02/17 0629)  Intake/Output from previous day: 02/16 0701 - 02/17 0700 In: 2462.4 [I.V.:2123.3; IV Piggyback:339.1] Out: 600 [Urine:600] Intake/Output this shift: No intake/output data recorded.   Physical Exam Constitutional:  Well nourished. Confused. No acute distress. HEENT: [place] AT, moist mucus membranes.  Trachea midline, no masses.  Poor dentition, missing teeth.  Cardiovascular: No clubbing, cyanosis, or edema. Respiratory: Normal respiratory effort, no increased work of breathing. GI: Abdomen is soft, non tender, non distended, no abdominal masses.  GU: No CVA tenderness.  No bladder fullness or masses. Neurologic: Mental status is at baseline  Lab Results:  Recent Labs    10/03/19 0551 10/04/19 0532  WBC 9.6 8.4  HGB 8.2* 8.4*  HCT 27.6* 27.4*  PLT 148* 159   BMET Recent Labs  10/03/19 0551 10/04/19 0532  NA 147* 146*  K 3.1* 3.9  CL 120* 118*  CO2 22 24  GLUCOSE 158* 122*  BUN 79* 42*  CREATININE 1.77* 1.07*  CALCIUM 7.5* 7.5*   PT/INR No results for input(s): LABPROT, INR in the last 72 hours. ABG No results for input(s): PHART, HCO3 in the last 72 hours.  Invalid input(s): PCO2, PO2  Studies/Results: No results found.   Assessment and Plan: 86 year old female admitted with urinary retention and AKI as well as urinary tract infection who has completed her course of culture appropriate antibiotics.  We will continue to defer further imaging or intervention given clinical improvement.    LOS: 4 days    [facility]  [facility] 10/05/2019

## 2019-10-05 NOTE — TOC Progression Note (Signed)
Transition of Care [facility]) - Progression Note    Patient Details  Name: Jaime [name] MRN: [id number] Date of Birth: Nov 03, 1932  Transition of Care [facility]) CM/SW Contact  Chapman Fitch, RN Phone Number: 10/05/2019, 10:20 AM  Clinical Narrative:     Received call from daughter in law requesting number to file compliant with the state and APS regarding the care the patient received will at [facility].  RNCM provided her with both of the contacts      Expected Discharge Plan: Home w Home Health Services    Expected Discharge Plan and Services Expected Discharge Plan: Home w Home Health Services   Discharge Planning Services: CM Consult                                           Social Determinants of Health (SDOH) Interventions    Readmission Risk Interventions Readmission Risk Prevention Plan 10/04/2019 04/21/2018  Post Dischage Appt - Complete  Medication Screening - Complete  Transportation Screening Complete Complete  PCP follow-up - Complete  Medication Review (RN Care Manager) Complete -  Some recent data might be hidden

## 2019-10-05 NOTE — Progress Notes (Addendum)
Physical Therapy Treatment Patient Details Name: Jaime [name] MRN: [id number] DOB: 1933-03-15 Today's Date: 10/05/2019    History of Present Illness 86 year old female was admitted to hosp just a day after leaving SNF to go home from a L hip ORIF on 08/26/19, with previous periprosthetic fracture on same hip.  Pt sustained the fall in same SNF that led to fracture, now noted to have mult areas of skin breakdown and injury including circumferential injury on chest.  Admitted with susp hypotension, sepsis from UTI, metabolic encephalopathy, AKI, hypothyroidism.  PMHx:  dementia, HTN,     PT Comments    Pt awake but lethargic.  Participated in exercises as described below.  Initially screams out softly with attempt at repositioning and any LE movement but with time, slow gentle movements and time, she is able to participate in supine ex's.  At times she will assist and move LE's on her own and others she resists, holding LE in extension and not allowing movements.  She asks several times " let me sleep" but when encouragement and education she states "thank you Honey." She is generally appropriate with conversation today but remains garbled and difficult to understand at times. Overall some improvement but remains significantly limited with mobility and exercises.   Follow Up Recommendations  SNF     Equipment Recommendations  None recommended by PT    Recommendations for Other Services       Precautions / Restrictions Precautions Precautions: Fall Precaution Comments: skin breakdown Restrictions Weight Bearing Restrictions: No    Mobility  Bed Mobility   Bed Mobility: Rolling Rolling: Max assist         General bed mobility comments: pt is not alert enough to move with sitting on side of bed and resisting the attempt to assist her  Transfers                    Ambulation/Gait                 Stairs             Wheelchair Mobility     Modified Rankin (Stroke Patients Only)       Balance                                            Cognition Arousal/Alertness: Lethargic Behavior During Therapy: Flat affect Overall Cognitive Status: No family/caregiver present to determine baseline cognitive functioning                                        Exercises Other Exercises Other Exercises: BLE AA/PROM ankle pumps, SLR, heel slides, ab/add x 10    General Comments        Pertinent Vitals/Pain Pain Assessment: Faces Faces Pain Scale: Hurts little more Pain Location: generally with movement - with scream out softly but does not seem to be in significant pain Pain Descriptors / Indicators: Grimacing;Guarding Pain Intervention(s): Limited activity within patient's tolerance;Monitored during session;Repositioned    Home Living                      Prior Function            PT Goals (current goals can now be found in the care  plan section) Progress towards PT goals: Progressing toward goals    Frequency    Min 2X/week      PT Plan Current plan remains appropriate    Co-evaluation              AM-PAC PT "6 Clicks" Mobility   Outcome Measure  Help needed turning from your back to your side while in a flat bed without using bedrails?: Total Help needed moving from lying on your back to sitting on the side of a flat bed without using bedrails?: Total Help needed moving to and from a bed to a chair (including a wheelchair)?: Total Help needed standing up from a chair using your arms (e.g., wheelchair or bedside chair)?: Total Help needed to walk in hospital room?: Total Help needed climbing 3-5 steps with a railing? : Total 6 Click Score: 6    End of Session   Activity Tolerance: Patient limited by lethargy Patient left: in bed;with call bell/phone within reach;with bed alarm set Nurse Communication: Mobility status;Other (comment) Pain - part of  body: Hip;Knee;Leg     Time: 0912-0923 PT Time Calculation (min) (ACUTE ONLY): 11 min  Charges:  $Therapeutic Exercise: 8-22 mins                    Chesley Noon, PTA 10/05/19, 9:29 AM

## 2019-10-05 NOTE — Progress Notes (Signed)
Dr. Lucianne Muss was notified of right upper extremity swelling. Monitor tonight and may get [place] tomorrow.

## 2019-10-06 DIAGNOSIS — R6520 Severe sepsis without septic shock: Secondary | ICD-10-CM

## 2019-10-06 LAB — CBC
HCT: 26.6 % — ABNORMAL LOW (ref 36.0–46.0)
Hemoglobin: 8.5 g/dL — ABNORMAL LOW (ref 12.0–15.0)
MCH: 28.8 pg (ref 26.0–34.0)
MCHC: 32 g/dL (ref 30.0–36.0)
MCV: 90.2 fL (ref 80.0–100.0)
Platelets: 129 10*3/uL — ABNORMAL LOW (ref 150–400)
RBC: 2.95 MIL/uL — ABNORMAL LOW (ref 3.87–5.11)
RDW: 15.6 % — ABNORMAL HIGH (ref 11.5–15.5)
WBC: 7.4 10*3/uL (ref 4.0–10.5)
nRBC: 0 % (ref 0.0–0.2)

## 2019-10-06 LAB — RENAL FUNCTION PANEL
Albumin: 1.6 g/dL — ABNORMAL LOW (ref 3.5–5.0)
Anion gap: 3 — ABNORMAL LOW (ref 5–15)
BUN: 11 mg/dL (ref 8–23)
CO2: 23 mmol/L (ref 22–32)
Calcium: 6.9 mg/dL — ABNORMAL LOW (ref 8.9–10.3)
Chloride: 114 mmol/L — ABNORMAL HIGH (ref 98–111)
Creatinine, Ser: 0.51 mg/dL (ref 0.44–1.00)
GFR calc Af Amer: >60 mL/min (ref >60)
GFR calc non Af Amer: >60 mL/min (ref >60)
Glucose, Bld: 121 mg/dL — ABNORMAL HIGH (ref 70–99)
Phosphorus: 1.9 mg/dL — ABNORMAL LOW (ref 2.5–4.6)
Potassium: 3.1 mmol/L — ABNORMAL LOW (ref 3.5–5.1)
Sodium: 140 mmol/L (ref 135–145)

## 2019-10-06 LAB — BASIC METABOLIC PANEL

## 2019-10-06 LAB — VITAMIN B12: Vitamin B-12: 1369 pg/mL — ABNORMAL HIGH (ref 180–914)

## 2019-10-06 LAB — PHOSPHORUS

## 2019-10-06 LAB — MAGNESIUM: Magnesium: 1.8 mg/dL (ref 1.7–2.4)

## 2019-10-06 LAB — VITAMIN D 25 HYDROXY (VIT D DEFICIENCY, FRACTURES): Vit D, 25-Hydroxy: 46.08 ng/mL (ref 30–100)

## 2019-10-06 MED ORDER — COLLAGENASE 250 UNIT/GM EX OINT
1.0000 "application " | TOPICAL_OINTMENT | Freq: Every day | CUTANEOUS | Status: DC
  Administered 2019-10-06 – 2019-10-09 (×4): 1 via TOPICAL
  Filled 2019-10-06: qty 30

## 2019-10-06 MED ORDER — POTASSIUM PHOSPHATES 15 MMOLE/5ML IV SOLN
30.0000 mmol | Freq: Once | INTRAVENOUS | Status: AC
  Administered 2019-10-06: 10:00:00 30 mmol via INTRAVENOUS
  Filled 2019-10-06: qty 10

## 2019-10-06 MED ORDER — MAGNESIUM SULFATE IN D5W 1-5 GM/100ML-% IV SOLN
1.0000 g | Freq: Once | INTRAVENOUS | Status: AC
  Administered 2019-10-06: 1 g via INTRAVENOUS
  Filled 2019-10-06 (×2): qty 100

## 2019-10-06 NOTE — Plan of Care (Signed)
Pt is more talkative today than yesterday. Still poor oral intake.

## 2019-10-06 NOTE — Progress Notes (Signed)
PROGRESS NOTE    Jaime [name]  XBJ:[id number] DOB: [birth year] DOA: 09/30/2019 PCP: Mickey Farber, MD      Brief Narrative:  Jaime [name] is an 86 y.o. F with HTN, hypothyroidism and mild dementia home dwelling who presented with confusion and decreased responsiveness/sensorium.  Patient had recently fallen and had a proximal femur fracture, been rehabilitated, then fell again and had a distal femur fracture and being admitted at [facility].  She was in rehab for several weeks, and just discharged a few days before admission here.  Since discharge, the patient had been sleepier and less responsive than usual, until the day of admission when she was impossible to arouse.  In the ER, creatinine 10.98, BUN 207.  She was started on fluids for renal failure and admitted.       Assessment & Plan:  Acute renal failure Metabolic acidosis due to renal failure Uremia due to renal failure Hyperkalemia due to renal failure Patient had foley placed on admission, subsequent renal US normal with mild hydro.  CT abdomen showed moderate hydronephrosis.  Overnight Cr 10.9 > 8.4 > 3.8 > 1.77 > 0.7>0.5 today Still Mild Acidosis resolved Patient was retaining urine >1000 ml on 10/05/2019, Foley catheter was re- inserted -Creatinine improved, decrease IV fluid.  Due to decreased p.o. intake.   -Continue Strict I/Os -Hold telmisartan -Avoid hypotension   Right hydronephrosis CT without stone.  She improved clincally with fluids and antibiotics. Urology consulted, they have deferred stent or repeat imaging. -Consult Urology, appreciate cares 2/17 Urine retention, Foley catheter reinserted  2/17 Started Flomax 0.4 mg p.o. daily  Hypokalemia Hypophosphatemia -Supplement K and phos  Hypomagnesemia, monitor replete as needed  Possible UTI with sepsis Presented with hypotension, hypothermia, leukocytosis, altered mental status, and renal failure and elevated lactic acid.  WBC normalized.   Culture with Enterobacter cloacae.  Completed 5 days ceftriaxone.  Reviewed with ID RPh, disagree with Urology, CTX is adequate coverage for E cloacae, especially in a patient clearly improved.     -Complete CTX, monitor fever curve  Hypernatremia, Resolved IMproving -Continue hypotonic fluids, d5 1/2 NS at 50 ml/hr -Trend BMP closely  Acute metabolic encephalopathy due to renal failure superimposed on dementia -PT eval done rec SNf -SLP eval  Atraumatic rhabdo CK resolved  Hypertension Normal -Hold ARB -Continue IV fuids  Hypothyroidism, TSH 5.4 elevated, recheck after 6 weeks, follow PCP -Continue levothyroxine IV until able to take p.o.  Mood disorder -Hold mirtazapine until improved  Recent fracture of left femur x 2, continue fall precautions and supportive care  Elevated troponin Without chest pain, this is due to poor clearance due to renal failure, not ischemia.  No further work up necessary.  Anemia Stable, likely delusional  Severe protein calorie malnutrition As evidenced by diffuse loss of subcutaneous muscle mass and fat, poor oral intake, and low albumin.   Disposition: The patient was admitted with severe renal failure, acute metabolic encephalopathy from severe uremia, and possible infection with hydronephrosis.  Urology and nephrology have been consulted, and the patient's renal function improved, AKI resolved, patient is retaining urine so Foley catheter was inserted.   Encephalopathy improving, still has electrolyte imbalance, low appetite so we will continue IV fluid and replete electrolytes.  Patient is improving gradually, prognosis still poor.  Palliative care consulted if condition deteriorates patient may need hospice down the road.   Discussed with patient's daughter over the phone and she plans to take her mother home with 24/7 care and home  health and home PT I suggested to talk to physical therapy and then make the final decision for  disposition I informed to case manager so that she can connect physical therapist to patient's daughter. Patient is gradually improving, can be discharged in 2 to 3 days   MDM: The below labs and imaging reports reviewed and summarized above.  Medication management as above.     DVT prophylaxis: SCDs Code Status: DNR Family Communication: Discussed with patient's daughter over the phone on 10/06/2019 all questions and concerns answered.   Consultants:   Urology  Nephrology  Procedures:   US renal -- mild hydronephrosis, no stone  2/13 CT abdomen and pelvis-hydronephrosis, possible stone  Antimicrobials:   Ceftriaxone   Culture data:   2/13 blood culture x2 no growth  2/13 urine culture Enterobacter cloacae   Subjective No overnight issues, patient was seen and examined at bedside. Patient is little bit more awake and alert as compared to yesterday, following commands and moving upper extremities, able to bend right knee but not left leg. Patient was unable to offer any complaints  Objective: Vitals:   10/05/19 0629 10/05/19 1122 10/05/19 2101 10/06/19 0633  BP: (!) 133/55 133/70 (!) 136/52 (!) 125/48  Pulse: 87 92 89 89  Resp: 20  20   Temp: (!) 97.4 F (36.3 C) (!) 97.4 F (36.3 C) 98.4 F (36.9 C) (!) 97.5 F (36.4 C)  TempSrc: Oral Oral Oral Oral  SpO2: 100% 95%  98%  Weight:      Height:        Intake/Output Summary (Last 24 hours) at 10/06/2019 1502 Last data filed at 10/06/2019 0300 Gross per 24 hour  Intake 2178.75 ml  Output 1451 ml  Net 727.75 ml   Filed Weights   09/30/19 2205  Weight: 71 kg    Examination: General appearance: Thin elderly adult female, slightly more alert today, still confused but no obvious distress.   HEENT: Anicteric, conjunctiva pink, lids and lashes normal for age. No nasal deformity, discharge, epistaxis.  Lips dry, dentition dry and with caking on them, oropharynx dry, no oral lesions  Skin: Warm and dry.  No  suspicious rashes or lesions. Cardiac: RRR, no murmurs appreciated.  No LE edema.    Respiratory: Normal respiratory rate and rhythm.  CTAB without rales or wheezes. Abdomen: Abdomen soft.  Mild nonfocal tenderness to palpation/grimace, no rebound, rigidity, or guarding. No ascites, distension, hepatosplenomegaly.   MSK: No deformities or effusions of the large joints of the upper or lower extremities bilaterally.  He is also subcutaneous muscle mass intact Neuro: [name] is sleepy, AO x1, unable to follow any commands and she was trying to talk but mumbling unable to understand.  Spontaneously moving upper extremities, did not notice lower extremity movement, seems very lethargic. Psych: Unable to assess attention diminished     Data Reviewed: I have personally reviewed following labs and imaging studies:  CBC: Recent Labs  Lab 09/30/19 2209 10/01/19 0415 10/02/19 0546 10/03/19 0551 10/04/19 0532 10/05/19 0908 10/06/19 0610  WBC 29.1*   < > 18.5* 9.6 8.4 10.1 7.4  NEUTROABS 26.6*  --   --   --   --   --   --   HGB 10.7*   < > 8.8* 8.2* 8.4* 8.9* 8.5*  HCT 35.3*   < > 29.3* 27.6* 27.4* 29.7* 26.6*  MCV 94.1   < > 95.8 96.8 92.9 94.3 90.2  PLT 442*   < > 227 148* 159  144* 129*   < > = values in this interval not displayed.   Basic Metabolic Panel: Recent Labs  Lab 10/03/19 0551 10/04/19 0532 10/05/19 0722 10/05/19 0908 10/06/19 0610  NA 147* 146* 141 141 DUPLICATE REQUEST  140  K 3.1* 3.9 3.7 3.6 DUPLICATE REQUEST  3.1*  CL 120* 118* 114* 114* DUPLICATE REQUEST  114*  CO2 22 24 18* 18* DUPLICATE REQUEST  23  GLUCOSE 158* 122* 122* 128* DUPLICATE REQUEST  121*  BUN 79* 42* 24* 23 DUPLICATE REQUEST  11  CREATININE 1.77* 1.07* 0.70 0.70 DUPLICATE REQUEST  0.51  CALCIUM 7.5* 7.5* 6.9* 6.9* DUPLICATE REQUEST  6.9*  MG  --   --   --  1.2* 1.8  PHOS 2.2* 1.3* 2.5 2.5 DUPLICATE REQUEST  1.9*   GFR: Estimated Creatinine Clearance: 51.9 mL/min (by C-G formula based  on SCr of 0.51 mg/dL). Liver Function Tests: Recent Labs  Lab 09/30/19 2209 09/30/19 2209 10/02/19 0546 10/03/19 0551 10/04/19 0532 10/05/19 0722 10/06/19 0610  AST 26  --   --   --   --   --   --   ALT 12  --   --   --   --   --   --   ALKPHOS 108  --   --   --   --   --   --   BILITOT 0.8  --   --   --   --   --   --   PROT 8.1  --   --   --   --   --   --   ALBUMIN 2.3*   < > 1.8* 1.7* 1.7* 1.8* 1.6*   < > = values in this interval not displayed.   No results for input(s): LIPASE, AMYLASE in the last 168 hours. No results for input(s): AMMONIA in the last 168 hours. Coagulation Profile: No results for input(s): INR, PROTIME in the last 168 hours. Cardiac Enzymes: Recent Labs  Lab 09/30/19 2209 10/02/19 0546 10/03/19 0551  CKTOTAL 418* 154 91   BNP (last 3 results) No results for input(s): PROBNP in the last 8760 hours. HbA1C: No results for input(s): HGBA1C in the last 72 hours. CBG: No results for input(s): GLUCAP in the last 168 hours. Lipid Profile: No results for input(s): CHOL, HDL, LDLCALC, TRIG, CHOLHDL, LDLDIRECT in the last 72 hours. Thyroid Function Tests: No results for input(s): TSH, T4TOTAL, FREET4, T3FREE, THYROIDAB in the last 72 hours. Anemia Panel: No results for input(s): VITAMINB12, FOLATE, FERRITIN, TIBC, IRON, RETICCTPCT in the last 72 hours. Urine analysis:    Component Value Date/Time   COLORURINE AMBER (A) 09/30/2019 2209   APPEARANCEUR TURBID (A) 09/30/2019 2209   LABSPEC 1.017 09/30/2019 2209   PHURINE 5.0 09/30/2019 2209   GLUCOSEU NEGATIVE 09/30/2019 2209   HGBUR SMALL (A) 09/30/2019 2209   BILIRUBINUR NEGATIVE 09/30/2019 2209   KETONESUR NEGATIVE 09/30/2019 2209   PROTEINUR 100 (A) 09/30/2019 2209   NITRITE NEGATIVE 09/30/2019 2209   LEUKOCYTESUR SMALL (A) 09/30/2019 2209   Sepsis Labs: @LABRCNTIP (procalcitonin:4,lacticacidven:4)  ) Recent Results (from the past 240 hour(s))  Urine culture     Status: Abnormal    Collection Time: 09/30/19 10:09 PM   Specimen: Urine, Random  Result Value Ref Range Status   Specimen Description   Final    URINE, RANDOM Performed at [facility], [address] [place], [place]    Special Requests   Final  NONE Performed at [facility], [address] [place], [place] [address]    Culture 80,000 COLONIES/mL ENTEROBACTER CLOACAE (A)  Final   Report Status 10/03/2019 FINAL  Final   Organism ID, Bacteria ENTEROBACTER CLOACAE (A)  Final      Susceptibility   Enterobacter cloacae - MIC*    CEFAZOLIN >=64 RESISTANT Resistant     CIPROFLOXACIN <=0.25 SENSITIVE Sensitive     GENTAMICIN <=1 SENSITIVE Sensitive     IMIPENEM 1 SENSITIVE Sensitive     NITROFURANTOIN 32 SENSITIVE Sensitive     TRIMETH/SULFA <=20 SENSITIVE Sensitive     PIP/TAZO 16 SENSITIVE Sensitive     * 80,000 COLONIES/mL ENTEROBACTER CLOACAE  Respiratory Panel by RT PCR (Flu A&B, Covid) - Urine, Clean Catch     Status: None   Collection Time: 09/30/19 10:09 PM   Specimen: Urine, Clean Catch  Result Value Ref Range Status   SARS Coronavirus 2 by RT PCR NEGATIVE NEGATIVE Final    Comment: (NOTE) SARS-CoV-2 target nucleic acids are NOT DETECTED. The SARS-CoV-2 RNA is generally detectable in upper respiratoy specimens during the acute phase of infection. The lowest concentration of SARS-CoV-2 viral copies this assay can detect is 131 copies/mL. A negative result does not preclude SARS-Cov-2 infection and should not be used as the sole basis for treatment or other patient management decisions. A negative result may occur with  improper specimen collection/handling, submission of specimen other than nasopharyngeal swab, presence of viral mutation(s) within the areas targeted by this assay, and inadequate number of viral copies (<131 copies/mL). A negative result must be combined with clinical observations, patient history, and epidemiological information.  The expected result is Negative. Fact Sheet for Patients:  PinkCheek.be Fact Sheet for Healthcare Providers:  GravelBags.it This test is not yet ap proved or cleared by the [place] FDA and  has been authorized for detection and/or diagnosis of SARS-CoV-2 by FDA under an Emergency Use Authorization (EUA). This EUA will remain  in effect (meaning this test can be used) for the duration of the COVID-19 declaration under Section 564(b)(1) of the Act, 21 U.S.C. section 360bbb-3(b)(1), unless the authorization is terminated or revoked sooner.    Influenza A by PCR NEGATIVE NEGATIVE Final   Influenza B by PCR NEGATIVE NEGATIVE Final    Comment: (NOTE) The Xpert Xpress SARS-CoV-2/FLU/RSV assay is intended as an aid in  the diagnosis of influenza from Nasopharyngeal swab specimens and  should not be used as a sole basis for treatment. Nasal washings and  aspirates are unacceptable for Xpert Xpress SARS-CoV-2/FLU/RSV  testing. Fact Sheet for Patients: PinkCheek.be Fact Sheet for Healthcare Providers: GravelBags.it This test is not yet approved or cleared by the [place] FDA and  has been authorized for detection and/or diagnosis of SARS-CoV-2 by  FDA under an Emergency Use Authorization (EUA). This EUA will remain  in effect (meaning this test can be used) for the duration of the  Covid-19 declaration under Section 564(b)(1) of the Act, 21  U.S.C. section 360bbb-3(b)(1), unless the authorization is  terminated or revoked. Performed at [facility], [address] [place], [place] [address]   Culture, blood (routine x 2)     Status: None   Collection Time: 09/30/19 10:31 PM   Specimen: Left Antecubital; Blood  Result Value Ref Range Status   Specimen Description LEFT ANTECUBITAL  Final   Special Requests Blood Culture adequate volume  Final   Culture    Final    NO GROWTH  5 DAYS Performed at [facility], [address] [place], [place] [address]    Report Status 10/05/2019 FINAL  Final  Culture, blood (routine x 2)     Status: None   Collection Time: 09/30/19 10:36 PM   Specimen: BLOOD LEFT ARM  Result Value Ref Range Status   Specimen Description BLOOD LEFT ARM  Final   Special Requests Blood Culture adequate volume  Final   Culture   Final    NO GROWTH 5 DAYS Performed at [facility], [address], [place], [place] [address]    Report Status 10/05/2019 FINAL  Final         Radiology Studies: No results found.      Scheduled Meds: . chlorhexidine  15 mL Mouth Rinse BID  . Chlorhexidine Gluconate Cloth  6 each Topical Daily  . collagenase  1 application Topical Daily  . levothyroxine  75 mcg Intravenous QAC breakfast  . mouth rinse  15 mL Mouth Rinse q12n4p  . multivitamin with minerals  1 tablet Oral Daily  . sodium bicarbonate  650 mg Oral TID  . tamsulosin  0.4 mg Oral Daily   Continuous Infusions: . sodium chloride 10 mL/hr at 10/06/19 0300  . dextrose 5 % and 0.45% NaCl 50 mL/hr at 10/06/19 1402  . potassium PHOSPHATE IVPB (in mmol) 30 mmol (10/06/19 0958)     LOS: 5 days    Time spent: 35 minutes     Gillis Santa, MD [facility] 10/06/2019, 3:02 PM     Please page though AMION or Epic secure chat:  For password, contact charge nurse

## 2019-10-06 NOTE — Consult Note (Addendum)
WOC Nurse Consult Note: Reason for Consult: Patient admitted with renal failure, acute and noted altered mental status.   Sepsis and UTI as well.  Recent hospitalization for femur fracture with surgical repair.  Discharged from rehab and noted increased in lethargy and confusion at home.  Brought to ED by daughter. Severe protein calorie malnutrition -low albumin, diffuse loss of muscle mass and fat. Wound type:unstageable sacral pressure injury.  Left lateral surgical site at distal femur ORIF site.   Pressure Injury POA: Yes Measurement: bedside RN to obtain and add to flow sheet.  Wound bed:100% devitalized tissue. Sacrum Healing surgical site left lateral leg Drainage (amount, consistency, odor) none  Periwound:intact Dressing procedure/placement/frequency: Dry dressing (silicone foam ) to surgical site left leg.  Change every three days.  Cleanse sacral wound with NS and pat dry. Apply Santyl to necrotic tissue.  TOp with NS moist gauze.  Secure with dry gauze and sacral foam. Change daily.  No disposable briefs or underpads.  Turn and reposition every two hours.  Will order mattress with low air loss feature.  Will not follow at this time.  Please re-consult if needed.  Maple Hudson MSN, RN, FNP-BC CWON Wound, Ostomy, Continence Nurse Pager [phone]

## 2019-10-06 NOTE — Care Management Important Message (Signed)
Important Message  Patient Details  Name: Jaime [name] MRN: [id number] Date of Birth: September 25, 1932   Medicare Important Message Given:  Yes     [name] 10/06/2019, 12:18 PM

## 2019-10-06 NOTE — Progress Notes (Signed)
Physical Therapy Treatment Patient Details Name: Jaime [name] MRN: [id number] DOB: February 12, 1933 Today's Date: 10/06/2019    History of Present Illness 86 year old female was admitted to [facility] just a day after leaving SNF to go home from a L hip ORIF on 08/26/19, with previous periprosthetic fracture on same hip.  Pt sustained the fall in same SNF that led to fracture, now noted to have mult areas of skin breakdown and injury including circumferential injury on chest.  Admitted with susp hypotension, sepsis from UTI, metabolic encephalopathy, AKI, hypothyroidism.  PMHx:  dementia, HTN,     PT Comments    Patient lying supine in bed, agreeable to participate in bed exercises but declined to attempt to sit up. Does request PT stop when performing AA/PROM reps on L LE but able to tolerate to end of set and continues to participate when changing exercise to R LE. Patient is not making much progress towards goals this point and is limited by lethargy, pain. AA/PROM exercises performed on BLE to decrease development of contractures and swelling. Patient would benefit from continued skilled physical therapy to address remaining impairments and functional limitations to work towards stated goals and return to PLOF or maximal functional independence.       Follow Up Recommendations  SNF     Equipment Recommendations  None recommended by PT    Recommendations for Other Services       Precautions / Restrictions Precautions Precautions: Fall Precaution Comments: skin breakdown Restrictions Weight Bearing Restrictions: No    Mobility  Bed Mobility               General bed mobility comments: patient declined to attempt to sit up but agreed to bed exercises.  Transfers                    Ambulation/Gait                 Stairs             Wheelchair Mobility    Modified Rankin (Stroke Patients Only)       Balance Overall balance assessment: Needs  assistance     Sitting balance - Comments: did not sit up this session     Standing balance-Leahy Scale: Zero                              Cognition                                              Exercises Other Exercises Other Exercises: BLE AA/PROM ankle pumps, heel slides, hip IR/ER 2x20 each. Patient more active with R LE compared to left.    General Comments        Pertinent Vitals/Pain Pain Assessment: Faces Faces Pain Scale: Hurts a little bit Pain Location: with left hip knee motion, slight grimacing, and a bit of resistance at the leg Pain Descriptors / Indicators: Guarding;Grimacing Pain Intervention(s): Limited activity within patient's tolerance;Monitored during session;Repositioned    Home Living                      Prior Function            PT Goals (current goals can now be found in the care plan section) Acute Rehab  PT Goals Patient Stated Goal: none stated PT Goal Formulation: Patient unable to participate in goal setting Time For Goal Achievement: 10/16/19 Potential to Achieve Goals: Fair Progress towards PT goals: Not progressing toward goals - comment(patient coninues to only tolerated bed exercises)    Frequency    Min 2X/week      PT Plan Current plan remains appropriate    Co-evaluation              AM-PAC PT "6 Clicks" Mobility   Outcome Measure  Help needed turning from your back to your side while in a flat bed without using bedrails?: Total Help needed moving from lying on your back to sitting on the side of a flat bed without using bedrails?: Total Help needed moving to and from a bed to a chair (including a wheelchair)?: Total Help needed standing up from a chair using your arms (e.g., wheelchair or bedside chair)?: Total Help needed to walk in hospital room?: Total Help needed climbing 3-5 steps with a railing? : Total 6 Click Score: 6    End of Session   Activity Tolerance:  Patient limited by lethargy Patient left: in bed;with call bell/phone within reach;with nursing/sitter in room Nurse Communication: Mobility status;Other (comment)(need for washing/changing soiled bed clothing due to watery stool) PT Visit Diagnosis: Muscle weakness (generalized) (M62.81);History of falling (Z91.81);Adult, failure to thrive (R62.7);Pain Pain - part of body: Hip;Knee;Leg     Time: 7619-5093 PT Time Calculation (min) (ACUTE ONLY): 18 min  Charges:  $Therapeutic Exercise: 8-22 mins                    Luretha Murphy. Ilsa Iha, PT, DPT 10/06/19, 2:50 PM

## 2019-10-06 NOTE — Progress Notes (Signed)
   10/06/19 1300  Clinical Encounter Type  Visited With Patient  Visit Type Follow-up  Referral From Chaplain  Consult/Referral To Chaplain  While rounding Chaplain briefly visited with patient. Patient was unable to respond to Chaplain. Chaplain silently prayed by the bedside.

## 2019-10-07 ENCOUNTER — Telehealth: Payer: Self-pay | Admitting: Urology

## 2019-10-07 DIAGNOSIS — R627 Adult failure to thrive: Secondary | ICD-10-CM

## 2019-10-07 DIAGNOSIS — N179 Acute kidney failure, unspecified: Secondary | ICD-10-CM | POA: Diagnosis present

## 2019-10-07 DIAGNOSIS — Z515 Encounter for palliative care: Secondary | ICD-10-CM

## 2019-10-07 DIAGNOSIS — Z7189 Other specified counseling: Secondary | ICD-10-CM

## 2019-10-07 LAB — BASIC METABOLIC PANEL
Anion gap: 5 (ref 5–15)
BUN: 7 mg/dL — ABNORMAL LOW (ref 8–23)
CO2: 22 mmol/L (ref 22–32)
Calcium: 7.1 mg/dL — ABNORMAL LOW (ref 8.9–10.3)
Chloride: 112 mmol/L — ABNORMAL HIGH (ref 98–111)
Creatinine, Ser: 0.45 mg/dL (ref 0.44–1.00)
GFR calc Af Amer: >60 mL/min (ref >60)
GFR calc non Af Amer: >60 mL/min (ref >60)
Glucose, Bld: 115 mg/dL — ABNORMAL HIGH (ref 70–99)
Potassium: 3.4 mmol/L — ABNORMAL LOW (ref 3.5–5.1)
Sodium: 139 mmol/L (ref 135–145)

## 2019-10-07 LAB — CBC
HCT: 26.6 % — ABNORMAL LOW (ref 36.0–46.0)
Hemoglobin: 8.4 g/dL — ABNORMAL LOW (ref 12.0–15.0)
MCH: 28.4 pg (ref 26.0–34.0)
MCHC: 31.6 g/dL (ref 30.0–36.0)
MCV: 89.9 fL (ref 80.0–100.0)
Platelets: 156 10*3/uL (ref 150–400)
RBC: 2.96 MIL/uL — ABNORMAL LOW (ref 3.87–5.11)
RDW: 15.4 % (ref 11.5–15.5)
WBC: 9.2 10*3/uL (ref 4.0–10.5)
nRBC: 0 % (ref 0.0–0.2)

## 2019-10-07 LAB — PHOSPHORUS: Phosphorus: 2.4 mg/dL — ABNORMAL LOW (ref 2.5–4.6)

## 2019-10-07 LAB — MAGNESIUM: Magnesium: 1.7 mg/dL (ref 1.7–2.4)

## 2019-10-07 MED ORDER — POTASSIUM PHOSPHATES 15 MMOLE/5ML IV SOLN
30.0000 mmol | Freq: Once | INTRAVENOUS | Status: AC
  Administered 2019-10-07: 11:00:00 30 mmol via INTRAVENOUS
  Filled 2019-10-07: qty 10

## 2019-10-07 MED ORDER — MAGNESIUM SULFATE 2 GM/50ML IV SOLN
2.0000 g | Freq: Once | INTRAVENOUS | Status: AC
  Administered 2019-10-07: 2 g via INTRAVENOUS
  Filled 2019-10-07: qty 50

## 2019-10-07 MED ORDER — CALCIUM CARBONATE 1250 (500 CA) MG PO TABS
500.0000 mg | ORAL_TABLET | Freq: Two times a day (BID) | ORAL | Status: DC
  Administered 2019-10-07 – 2019-10-10 (×5): 500 mg via ORAL
  Filled 2019-10-07 (×9): qty 1

## 2019-10-07 NOTE — [place] FL2 (Signed)
[place] MEDICAID FL2 LEVEL OF CARE SCREENING TOOL     IDENTIFICATION  Patient Name: Jaime [name] Birthdate: 18-Jun-1933 Sex: female Admission Date (Current Location): 09/30/2019  [facility] and [place] Number:  Chiropodist and Address:         Provider Number: [id number]  Attending Physician Name and Address:  Gillis Santa, MD  Relative Name and Phone Number:       Current Level of Care: Hospital Recommended Level of Care: Skilled Nursing Facility Prior Approval Number:    Date Approved/Denied:   PASRR Number: [id number]  Discharge Plan: SNF    Current Diagnoses: Patient Active Problem List   Diagnosis Date Noted  . Sepsis (HCC) 10/01/2019  . Essential hypertension 08/27/2019  . Closed fracture of left distal femur (HCC) 08/26/2019  . Normocytic anemia 08/26/2019  . Fall at home, initial encounter 08/26/2019  . Closed fracture of left distal femur (HCC) 08/24/2019  . Intertrochanteric fracture (HCC) 08/21/2019  . Hypothyroidism 08/21/2019  . Protein-calorie malnutrition, severe 04/20/2018  . Dementia with behavioral disturbance (HCC) 04/20/2018    Orientation RESPIRATION BLADDER Height & Weight     Self  Normal Incontinent, Indwelling catheter Weight: 71 kg Height:  5\' 8"  (172.7 cm)  BEHAVIORAL SYMPTOMS/MOOD NEUROLOGICAL BOWEL NUTRITION STATUS      Incontinent Diet(Dys 1 honey thick)  AMBULATORY STATUS COMMUNICATION OF NEEDS Skin       PU Stage and Appropriate Care                       Personal Care Assistance Level of Assistance  Total care           Functional Limitations Info             SPECIAL CARE FACTORS FREQUENCY  PT (By licensed PT), OT (By licensed OT)                    Contractures Contractures Info: Not present    Additional Factors Info  Code Status, Allergies Code Status Info: DNR Allergies Info: Memantine, donepezil           Current Medications (10/07/2019):  This is the current  hospital active medication list Current Facility-Administered Medications  Medication Dose Route Frequency Provider Last Rate Last Admin  . 0.9 %  sodium chloride infusion   Intravenous PRN 10/09/2019, MD   Stopped at 10/06/19 [phone]  . acetaminophen (TYLENOL) tablet 650 mg  650 mg Oral Q6H PRN Mansy, 1660, MD       Or  . acetaminophen (TYLENOL) suppository 650 mg  650 mg Rectal Q6H PRN Mansy, Jan A, MD      . calcium carbonate (OS-CAL - dosed in mg of elemental calcium) tablet 500 mg of elemental calcium  500 mg of elemental calcium Oral BID WC Feb, MD   500 mg of elemental calcium at 10/07/19 1448  . chlorhexidine (PERIDEX) 0.12 % solution 15 mL  15 mL Mouth Rinse BID 10/09/19, MD   15 mL at 10/07/19 0919  . Chlorhexidine Gluconate Cloth 2 % PADS 6 each  6 each Topical Daily 10/09/19, MD   6 each at 10/07/19 1458  . collagenase (SANTYL) ointment 1 application  1 application Topical Daily 10/09/19, MD   1 application at 10/07/19 1101  . dextrose 5 %-0.45 % sodium chloride infusion   Intravenous Continuous 10/09/19, MD 50 mL/hr at 10/06/19 1528 Rate Verify at 10/06/19 1528  .  levothyroxine (SYNTHROID, LEVOTHROID) injection 75 mcg  75 mcg Intravenous QAC breakfast Edwin Dada, MD   75 mcg at 10/07/19 0513  . MEDLINE mouth rinse  15 mL Mouth Rinse q12n4p Danford, Suann Larry, MD   15 mL at 10/06/19 1759  . multivitamin with minerals tablet 1 tablet  1 tablet Oral Daily Danford, Suann Larry, MD   1 tablet at 10/07/19 0918  . ondansetron (ZOFRAN) tablet 4 mg  4 mg Oral Q6H PRN Mansy, Jan A, MD       Or  . ondansetron [facility]) injection 4 mg  4 mg Intravenous Q6H PRN Mansy, Jan A, MD      . potassium PHOSPHATE 30 mmol in dextrose 5 % 500 mL infusion  30 mmol Intravenous Once Val Riles, MD 85 mL/hr at 10/07/19 1104 30 mmol at 10/07/19 1104  . tamsulosin (FLOMAX) capsule 0.4 mg  0.4 mg Oral Daily Val Riles, MD   0.4 mg at 10/07/19  2197     Discharge Medications: Please see discharge summary for a list of discharge medications.  Relevant Imaging Results:  Relevant Lab Results:   Additional Information SS# Wyola, Illene Silver, RN

## 2019-10-07 NOTE — Progress Notes (Signed)
PROGRESS NOTE    Jaime [name]  IEP:[id number] DOB: [birth year] DOA: 09/30/2019 PCP: Mickey Farber, MD      Brief Narrative:  Jaime [name] is an 86 y.o. F with HTN, hypothyroidism and mild dementia home dwelling who presented with confusion and decreased responsiveness/sensorium.  Patient had recently fallen and had a proximal femur fracture, been rehabilitated, then fell again and had a distal femur fracture and being admitted at [facility].  She was in rehab for several weeks, and just discharged a few days before admission here.  Since discharge, the patient had been sleepier and less responsive than usual, until the day of admission when she was impossible to arouse.  In the ER, creatinine 10.98, BUN 207.  She was started on fluids for renal failure and admitted.       Assessment & Plan:  Acute renal failure Metabolic acidosis due to renal failure Uremia due to renal failure Hyperkalemia due to renal failure Patient had foley placed on admission, subsequent renal US normal with mild hydro.  CT abdomen showed moderate hydronephrosis.  Overnight Cr 10.9 > 8.4 > 3.8 > 1.77 > 0.7>0.5  Mild Acidosis resolved Patient was retaining urine >1000 ml on 10/05/2019, Foley catheter was re- inserted -Creatinine improved, continue IV fluid D51/2 NS due to decreased p.o. intake.   -Continue Strict I/Os -Hold telmisartan -Avoid hypotension   Right hydronephrosis CT without stone.  She improved clincally with fluids and antibiotics ( completed for 5 days) Urology consulted, they have deferred stent or repeat imaging. -Consult Urology, appreciate cares 2/17 Urine retention, Foley catheter reinserted  2/17 Started Flomax 0.4 mg p.o. daily  Hypokalemia Hypophosphatemia -Supplement K and phos  Hypomagnesemia, monitor replete as needed  Possible UTI with sepsis Presented with hypotension, hypothermia, leukocytosis, altered mental status, and renal failure and elevated lactic  acid.  WBC normalized.  Culture with Enterobacter cloacae.  Completed 5 days ceftriaxone.  Reviewed with ID RPh, disagree with Urology, CTX is adequate coverage for E cloacae, especially in a patient clearly improved.     -Complete CTX, monitor fever curve  Hypernatremia, Resolved IMproving -Continue hypotonic fluids, d5 1/2 NS at 50 ml/hr -Trend BMP closely  Acute metabolic encephalopathy due to renal failure superimposed on dementia -PT eval done rec SNf -SLP eval  Atraumatic rhabdo CK resolved  Hypertension Normal -Hold ARB -Continue IV fuids  Hypothyroidism, TSH 5.4 elevated, recheck after 6 weeks, follow PCP -Continue levothyroxine IV until able to take p.o.  Mood disorder -Hold mirtazapine until improved  Recent fracture of left femur x 2, continue fall precautions and supportive care  Elevated troponin Without chest pain, this is due to poor clearance due to renal failure, not ischemia.  No further work up necessary.  Anemia Stable, likely delusional  Severe protein calorie malnutrition As evidenced by diffuse loss of subcutaneous muscle mass and fat, poor oral intake, and low albumin.   Disposition: The patient was admitted with severe renal failure, acute metabolic encephalopathy from severe uremia, and possible infection with hydronephrosis.  Urology and nephrology have been consulted, and the patient's renal function improved, AKI resolved, patient is retaining urine so Foley catheter was inserted.   Encephalopathy improving, still has electrolyte imbalance, low appetite so we will continue IV fluid and replete electrolytes.  Patient is improving gradually, prognosis still poor.  Palliative care consulted if condition deteriorates patient may need hospice down the road.   Discussed with patient's daughter over the phone and she plans to take her mother home  with 24/7 care and home health and home PT I suggested to talk to physical therapy and then make the  final decision for disposition I informed to case manager so that she can connect physical therapist to patient's daughter. Patient is gradually improving, can be discharged in 1-2 days  PT contacted patient's caregiver who will come to the hospital for trending and then patient can be discharged home if family feels comfortable??   MDM: The below labs and imaging reports reviewed and summarized above.  Medication management as above.     DVT prophylaxis: SCDs Code Status: DNR Family Communication: Discussed with patient's daughter over the phone on 10/06/2019 all questions and concerns answered.   Consultants:   Urology  Nephrology  Procedures:   US renal -- mild hydronephrosis, no stone  2/13 CT abdomen and pelvis-hydronephrosis, possible stone  Antimicrobials:   Ceftriaxone   Culture data:   2/13 blood culture x2 no growth  2/13 urine culture Enterobacter cloacae   Subjective No overnight issues, patient was seen and examined at bedside. Patient is little bit more awake and alert as compared to yesterday, following commands and moving upper extremities, able to bend right knee but not left leg. Patient was unable to offer any complaints  Objective: Vitals:   10/06/19 0633 10/06/19 2129 10/07/19 0333 10/07/19 1506  BP: (!) 125/48 138/62 128/64 (!) 125/53  Pulse: 89 98 (!) 102 97  Resp:  20 18 16   Temp: (!) 97.5 F (36.4 C) 97.7 F (36.5 C) 97.9 F (36.6 C) 98.7 F (37.1 C)  TempSrc: Oral Oral Oral Oral  SpO2: 98% 99% 97% 97%  Weight:      Height:        Intake/Output Summary (Last 24 hours) at 10/07/2019 1523 Last data filed at 10/07/2019 0300 Gross per 24 hour  Intake 1262.22 ml  Output 1650 ml  Net -387.78 ml   Filed Weights   09/30/19 2205  Weight: 71 kg    Examination: General appearance: Thin elderly adult female, slightly more alert today, still confused but no obvious distress.   HEENT: Anicteric, conjunctiva pink, lids and lashes  normal for age. No nasal deformity, discharge, epistaxis.  Lips dry, dentition dry and with caking on them, oropharynx dry, no oral lesions  Skin: Warm and dry.  No suspicious rashes or lesions. Cardiac: RRR, no murmurs appreciated.  No LE edema.    Respiratory: Normal respiratory rate and rhythm.  CTAB without rales or wheezes. Abdomen: Abdomen soft.  Mild nonfocal tenderness to palpation/grimace, no rebound, rigidity, or guarding. No ascites, distension, hepatosplenomegaly.   MSK: No deformities or effusions of the large joints of the upper or lower extremities bilaterally.  He is also subcutaneous muscle mass intact Neuro: [name] is sleepy, AO x1, unable to follow any commands and she was trying to talk but mumbling unable to understand.  Spontaneously moving upper extremities, did not notice lower extremity movement, seems very lethargic. Psych: Unable to assess attention diminished     Data Reviewed: I have personally reviewed following labs and imaging studies:  CBC: Recent Labs  Lab 09/30/19 2209 10/01/19 0415 10/03/19 0551 10/04/19 0532 10/05/19 0908 10/06/19 0610 10/07/19 0341  WBC 29.1*   < > 9.6 8.4 10.1 7.4 9.2  NEUTROABS 26.6*  --   --   --   --   --   --   HGB 10.7*   < > 8.2* 8.4* 8.9* 8.5* 8.4*  HCT 35.3*   < > 27.6* 27.4*  29.7* 26.6* 26.6*  MCV 94.1   < > 96.8 92.9 94.3 90.2 89.9  PLT 442*   < > 148* 159 144* 129* 156   < > = values in this interval not displayed.   Basic Metabolic Panel: Recent Labs  Lab 10/04/19 0532 10/05/19 0722 10/05/19 0908 10/06/19 0610 10/07/19 0341  NA 146* 141 141 DUPLICATE REQUEST  140 139  K 3.9 3.7 3.6 DUPLICATE REQUEST  3.1* 3.4*  CL 118* 114* 114* DUPLICATE REQUEST  114* 112*  CO2 24 18* 18* DUPLICATE REQUEST  23 22  GLUCOSE 122* 122* 128* DUPLICATE REQUEST  121* 115*  BUN 42* 24* 23 DUPLICATE REQUEST  11 7*  CREATININE 1.07* 0.70 0.70 DUPLICATE REQUEST  0.51 0.45  CALCIUM 7.5* 6.9* 6.9* DUPLICATE REQUEST  6.9*  7.1*  MG  --   --  1.2* 1.8 1.7  PHOS 1.3* 2.5 2.5 DUPLICATE REQUEST  1.9* 2.4*   GFR: Estimated Creatinine Clearance: 51.9 mL/min (by C-G formula based on SCr of 0.45 mg/dL). Liver Function Tests: Recent Labs  Lab 09/30/19 2209 09/30/19 2209 10/02/19 0546 10/03/19 0551 10/04/19 0532 10/05/19 0722 10/06/19 0610  AST 26  --   --   --   --   --   --   ALT 12  --   --   --   --   --   --   ALKPHOS 108  --   --   --   --   --   --   BILITOT 0.8  --   --   --   --   --   --   PROT 8.1  --   --   --   --   --   --   ALBUMIN 2.3*   < > 1.8* 1.7* 1.7* 1.8* 1.6*   < > = values in this interval not displayed.   No results for input(s): LIPASE, AMYLASE in the last 168 hours. No results for input(s): AMMONIA in the last 168 hours. Coagulation Profile: No results for input(s): INR, PROTIME in the last 168 hours. Cardiac Enzymes: Recent Labs  Lab 09/30/19 2209 10/02/19 0546 10/03/19 0551  CKTOTAL 418* 154 91   BNP (last 3 results) No results for input(s): PROBNP in the last 8760 hours. HbA1C: No results for input(s): HGBA1C in the last 72 hours. CBG: No results for input(s): GLUCAP in the last 168 hours. Lipid Profile: No results for input(s): CHOL, HDL, LDLCALC, TRIG, CHOLHDL, LDLDIRECT in the last 72 hours. Thyroid Function Tests: No results for input(s): TSH, T4TOTAL, FREET4, T3FREE, THYROIDAB in the last 72 hours. Anemia Panel: Recent Labs    10/06/19 0810  VITAMINB12 1,369*   Urine analysis:    Component Value Date/Time   COLORURINE AMBER (A) 09/30/2019 2209   APPEARANCEUR TURBID (A) 09/30/2019 2209   LABSPEC 1.017 09/30/2019 2209   PHURINE 5.0 09/30/2019 2209   GLUCOSEU NEGATIVE 09/30/2019 2209   HGBUR SMALL (A) 09/30/2019 2209   BILIRUBINUR NEGATIVE 09/30/2019 2209   KETONESUR NEGATIVE 09/30/2019 2209   PROTEINUR 100 (A) 09/30/2019 2209   NITRITE NEGATIVE 09/30/2019 2209   LEUKOCYTESUR SMALL (A) 09/30/2019 2209   Sepsis  Labs: @LABRCNTIP (procalcitonin:4,lacticacidven:4)  ) Recent Results (from the past 240 hour(s))  Urine culture     Status: Abnormal   Collection Time: 09/30/19 10:09 PM   Specimen: Urine, Random  Result Value Ref Range Status   Specimen Description   Final    URINE, RANDOM Performed at 11/28/19  [facility], [address], [place], [place] [address]    Special Requests   Final    NONE Performed at [facility], [address] [place], [place] [address]    Culture 80,000 COLONIES/mL ENTEROBACTER CLOACAE (A)  Final   Report Status 10/03/2019 FINAL  Final   Organism ID, Bacteria ENTEROBACTER CLOACAE (A)  Final      Susceptibility   Enterobacter cloacae - MIC*    CEFAZOLIN >=64 RESISTANT Resistant     CIPROFLOXACIN <=0.25 SENSITIVE Sensitive     GENTAMICIN <=1 SENSITIVE Sensitive     IMIPENEM 1 SENSITIVE Sensitive     NITROFURANTOIN 32 SENSITIVE Sensitive     TRIMETH/SULFA <=20 SENSITIVE Sensitive     PIP/TAZO 16 SENSITIVE Sensitive     * 80,000 COLONIES/mL ENTEROBACTER CLOACAE  Respiratory Panel by RT PCR (Flu A&B, Covid) - Urine, Clean Catch     Status: None   Collection Time: 09/30/19 10:09 PM   Specimen: Urine, Clean Catch  Result Value Ref Range Status   SARS Coronavirus 2 by RT PCR NEGATIVE NEGATIVE Final    Comment: (NOTE) SARS-CoV-2 target nucleic acids are NOT DETECTED. The SARS-CoV-2 RNA is generally detectable in upper respiratoy specimens during the acute phase of infection. The lowest concentration of SARS-CoV-2 viral copies this assay can detect is 131 copies/mL. A negative result does not preclude SARS-Cov-2 infection and should not be used as the sole basis for treatment or other patient management decisions. A negative result may occur with  improper specimen collection/handling, submission of specimen other than nasopharyngeal swab, presence of viral mutation(s) within the areas targeted by this assay, and inadequate number of viral  copies (<131 copies/mL). A negative result must be combined with clinical observations, patient history, and epidemiological information. The expected result is Negative. Fact Sheet for Patients:  https://www.moore.com/ Fact Sheet for Healthcare Providers:  https://www.young.biz/ This test is not yet ap proved or cleared by the [place] FDA and  has been authorized for detection and/or diagnosis of SARS-CoV-2 by FDA under an Emergency Use Authorization (EUA). This EUA will remain  in effect (meaning this test can be used) for the duration of the COVID-19 declaration under Section 564(b)(1) of the Act, 21 U.S.C. section 360bbb-3(b)(1), unless the authorization is terminated or revoked sooner.    Influenza A by PCR NEGATIVE NEGATIVE Final   Influenza B by PCR NEGATIVE NEGATIVE Final    Comment: (NOTE) The Xpert Xpress SARS-CoV-2/FLU/RSV assay is intended as an aid in  the diagnosis of influenza from Nasopharyngeal swab specimens and  should not be used as a sole basis for treatment. Nasal washings and  aspirates are unacceptable for Xpert Xpress SARS-CoV-2/FLU/RSV  testing. Fact Sheet for Patients: https://www.moore.com/ Fact Sheet for Healthcare Providers: https://www.young.biz/ This test is not yet approved or cleared by the [place] FDA and  has been authorized for detection and/or diagnosis of SARS-CoV-2 by  FDA under an Emergency Use Authorization (EUA). This EUA will remain  in effect (meaning this test can be used) for the duration of the  Covid-19 declaration under Section 564(b)(1) of the Act, 21  U.S.C. section 360bbb-3(b)(1), unless the authorization is  terminated or revoked. Performed at [facility], [address] [place], [place] [address]   Culture, blood (routine x 2)     Status: None   Collection Time: 09/30/19 10:31 PM   Specimen: Left Antecubital; Blood  Result  Value Ref Range Status   Specimen Description LEFT ANTECUBITAL  Final  Special Requests Blood Culture adequate volume  Final   Culture   Final    NO GROWTH 5 DAYS Performed at [facility], [address] [place], [place] [address]    Report Status 10/05/2019 FINAL  Final  Culture, blood (routine x 2)     Status: None   Collection Time: 09/30/19 10:36 PM   Specimen: BLOOD LEFT ARM  Result Value Ref Range Status   Specimen Description BLOOD LEFT ARM  Final   Special Requests Blood Culture adequate volume  Final   Culture   Final    NO GROWTH 5 DAYS Performed at [facility], [address] [place], [place] [address]    Report Status 10/05/2019 FINAL  Final         Radiology Studies: No results found.      Scheduled Meds: . calcium carbonate  500 mg of elemental calcium Oral BID WC  . chlorhexidine  15 mL Mouth Rinse BID  . Chlorhexidine Gluconate Cloth  6 each Topical Daily  . collagenase  1 application Topical Daily  . levothyroxine  75 mcg Intravenous QAC breakfast  . mouth rinse  15 mL Mouth Rinse q12n4p  . multivitamin with minerals  1 tablet Oral Daily  . tamsulosin  0.4 mg Oral Daily   Continuous Infusions: . sodium chloride Stopped (10/06/19 0513)  . dextrose 5 % and 0.45% NaCl 50 mL/hr at 10/06/19 1528  . potassium PHOSPHATE IVPB (in mmol) 30 mmol (10/07/19 1104)     LOS: 6 days    Time spent: 35 minutes     Gillis Santa, MD [facility] 10/07/2019, 3:23 PM     Please page though AMION or Epic secure chat:  For password, contact charge nurse

## 2019-10-07 NOTE — Progress Notes (Signed)
SLP Cancellation Note  Patient Details Name: Jaime [name] MRN: [id number] DOB: [birth year]   Cancelled treatment:       Reason Eval/Treat Not Completed: Patient declined, no reason specified(chart reviewed; sat w/ pt in room). Upon entering room, pt stated she was "uncomfortable" and "cold some". Repositioned pt in bed; gave covers. Noted pt's oral cavity was min drier w/ less oral secretions as had been described by NSG though min dried blood at lips still. Pt's speech was muttered/mumbled but she was more engaging.  Encouraged pt to take a few po trials w/ this SLP, but she refused despite requests.  Recommend continue w/ current diet w/ trials to upgrade next 1-3 days as appropriate. She appears to be improving slightly compared to previous days. NSG updated.      Jerilynn Som, MS, CCC-SLP Jaime [name],Jaime [name] 10/07/2019, 3:39 PM

## 2019-10-07 NOTE — Consult Note (Signed)
Consultation Note Date: 10/07/2019   Patient Name: Jaime [name]  DOB: 1933-01-14  MRN: [id number]  Age / Sex: 86 y.o., female  PCP: Mickey Farber, MD Referring Physician: Gillis Santa, MD  Reason for Consultation: Establishing goals of care  HPI/Patient Profile: 86 y.o. female  with past medical history of HTN, hypothyroidism, and dementia admitted on 09/30/2019 with AMS. Patient fell the beginning of Jan x 2 and had surgical repair. Patient went to rehab and was then discharged home. At home, patient was lethargic and brought to ER. Found to have creatinine of 10.98. Creatinine has improved with IV fluids. However, lethargy and poor PO intake continues. PMT consulted for GOC.  Clinical Assessment and Goals of Care: I have reviewed medical records including EPIC notes, labs and imaging, received report from RN, assessed the patient and then spoke with patient's daughter, [name], and DIL Mincy to discuss diagnosis prognosis, GOC, EOL wishes, disposition and options.  I introduced Palliative Medicine as specialized medical care for people living with serious illness. It focuses on providing relief from the symptoms and stress of a serious illness. The goal is to improve quality of life for both the patient and the family.  They tell me patient had been doing fairly well until falls in January. Prior, she would go out to dinner with her boyfriend regularly. She could independently care for herself. She did have mild dementia and they speak of her confusion.    We discussed her current illness and what it means in the larger context of her on-going co-morbidities.  Natural disease trajectory and expectations at EOL were discussed. Discussed that her labs have improved; however, she continues to be lethargic with poor PO intake. Will not attempt to sit up with PT.   Discussed disposition options with family: SNF rehab, home with home health, home  with hospice - thoroughly discussed each options and questions answered. Ultimately family feels hospice is the type of support the patient needs at this stage in her life.   We did discuss an overall poor prognosis d/t patient's debility and failure to thrive.   Questions and concerns were addressed. The family was encouraged to call with questions or concerns.   Primary Decision Maker HCPOA - daughter [name], son Hoyle Barr OF RECOMMENDATIONS   - home with hospice (discussed with daughter and DIL) family to arrange additional caregivers in the home - request some time to arrange caregivers prior to dc - family educated on poor prognosis and shifting care to focus on quality of life/comforrt - they agree  Code Status/Advance Care Planning:  DNR  Psycho-social/Spiritual:   Desire for further Chaplaincy support:no  Additional Recommendations: Education on Hospice  Prognosis:   Possibly weeks d/t poor PO intake  Discharge Planning: Home with Hospice      Primary Diagnoses: Present on Admission: . Sepsis (HCC)   I have reviewed the medical record, interviewed the patient and family, and examined the patient. The following aspects are pertinent.  Past Medical History:  Diagnosis Date  . Anxiety   . Dementia (HCC)    per daughter  . Depression   . Hypertension   . Hypothyroidism    Social History   Socioeconomic History  . Marital status: Widowed    Spouse name: Not on file  . Number of children: Not on file  . Years of education: Not on file  . Highest education level: Not on file  Occupational History  . Not on file  Tobacco Use  . Smoking status: Never Smoker  . Smokeless tobacco: Never Used  Substance and Sexual Activity  . Alcohol use: Not Currently  . Drug use: Never  . Sexual activity: Not on file  Other Topics Concern  . Not on file  Social History Narrative  . Not on file   Social Determinants of Health   Financial Resource Strain:    . Difficulty of Paying Living Expenses: Not on file  Food Insecurity:   . Worried About Charity fundraiser in the Last Year: Not on file  . Ran Out of Food in the Last Year: Not on file  Transportation Needs:   . Lack of Transportation (Medical): Not on file  . Lack of Transportation (Non-Medical): Not on file  Physical Activity:   . Days of Exercise per Week: Not on file  . Minutes of Exercise per Session: Not on file  Stress:   . Feeling of Stress : Not on file  Social Connections:   . Frequency of Communication with Friends and Family: Not on file  . Frequency of Social Gatherings with Friends and Family: Not on file  . Attends Religious Services: Not on file  . Active Member of Clubs or Organizations: Not on file  . Attends Archivist Meetings: Not on file  . Marital Status: Not on file   Family History  Problem Relation Age of Onset  . Breast cancer Maternal Aunt        2 mat aunts   Scheduled Meds: . calcium carbonate  500 mg of elemental calcium Oral BID WC  . chlorhexidine  15 mL Mouth Rinse BID  . Chlorhexidine Gluconate Cloth  6 each Topical Daily  . collagenase  1 application Topical Daily  . levothyroxine  75 mcg Intravenous QAC breakfast  . mouth rinse  15 mL Mouth Rinse q12n4p  . multivitamin with minerals  1 tablet Oral Daily  . tamsulosin  0.4 mg Oral Daily   Continuous Infusions: . sodium chloride Stopped (10/06/19 0513)  . dextrose 5 % and 0.45% NaCl Stopped (10/07/19 0931)  . potassium PHOSPHATE IVPB (in mmol) 85 mL/hr at 10/07/19 1500   PRN Meds:.sodium chloride, acetaminophen **OR** acetaminophen, ondansetron **OR** ondansetron (ZOFRAN) IV Allergies  Allergen Reactions  . Memantine Other (See Comments)    Worsened confusion  . Donepezil Other (See Comments)    Poor appetite, weight loss   Review of Systems  Unable to perform ROS: Dementia    Physical Exam Constitutional:      General: She is sleeping. She is not in acute  distress.    Appearance: She is ill-appearing.  HENT:     Mouth/Throat:     Mouth: Mucous membranes are dry.  Pulmonary:     Effort: Pulmonary effort is normal.  Abdominal:     Palpations: Abdomen is soft.  Musculoskeletal:     Right lower leg: No edema.     Left lower leg: No edema.  Skin:    General: Skin is warm and dry.  Neurological:     Mental Status: She is easily aroused. She is disoriented.     Gait: Gait normal.     Comments: Speech incoherent     Vital Signs: BP (!) 125/53 (BP Location: Right Arm)   Pulse 97   Temp 98.7 F (37.1 C) (Oral)   Resp 16   Ht 5\' 8"  (1.727 m)   Wt 71 kg   SpO2 97%   BMI 23.80 kg/m  Pain Scale: 0-10 POSS *See Group Information*: 1-Acceptable,Awake and alert Pain Score: Asleep   SpO2: SpO2: 97 % O2 Device:SpO2: 97 % O2 Flow Rate: .   IO: Intake/output summary:   Intake/Output Summary (Last 24 hours) at 10/07/2019 1621 Last data filed at 10/07/2019 1500 Gross per 24 hour  Intake 1152.41 ml  Output 1650 ml  Net -497.59 ml    LBM: Last BM Date: 10/06/19 Baseline Weight: Weight: 71 kg Most recent weight: Weight: 71 kg     Palliative Assessment/Data: PPS 20%     Time Total: 70 minutes Greater than 50%  of this time was spent counseling and coordinating care related to the above assessment and plan.  Gerlean Ren, DNP, AGNP-C Palliative Medicine Team [phone] Pager: [phone]

## 2019-10-07 NOTE — Telephone Encounter (Signed)
-----   Message from Sondra Come, MD sent at 10/06/2019  2:47 PM EST ----- Regarding: follow up Follow-up for voiding trial in 2 weeks with PA  Legrand Rams, MD 10/06/2019

## 2019-10-07 NOTE — TOC Progression Note (Signed)
Transition of Care [facility]) - Progression Note    Patient Details  Name: Jaime [name] MRN: [id number] Date of Birth: 05/18/1933  Transition of Care [facility]) CM/SW Contact  Chapman Fitch, RN Phone Number: 10/07/2019, 3:34 PM  Clinical Narrative:     Family requested for PT to meet with patient and primary caregiver of patient Gunnar Fusi [place]).  That way Gunnar Fusi would be able to see patient's current status and determine  if the caregivers would be able to meet the patient's needs.    At this time Gunnar Fusi has spoken with the other caregivers in the home and they have determined that they will be unable to meet the patient's needs in the home  RNCM called Daughter [name] x2 and left voicemail RNCM called son Gery Pray.  Notified him that the caregivers are saying they will not be able to meet his mothers needs.   Gery Pray is in agreement to bed search. Does not want it to be sent to [facility].   Existing PASRR Fl2 sent for signature  Bed search initiated  Expected Discharge Plan: Home w Home Health Services    Expected Discharge Plan and Services Expected Discharge Plan: Home w Home Health Services   Discharge Planning Services: CM Consult                                           Social Determinants of Health (SDOH) Interventions    Readmission Risk Interventions Readmission Risk Prevention Plan 10/04/2019 04/21/2018  Post Dischage Appt - Complete  Medication Screening - Complete  Transportation Screening Complete Complete  PCP follow-up - Complete  Medication Review (RN Care Manager) Complete -  Some recent data might be hidden

## 2019-10-07 NOTE — Telephone Encounter (Signed)
done

## 2019-10-08 LAB — BASIC METABOLIC PANEL
Anion gap: 10 (ref 5–15)
BUN: <5 mg/dL — ABNORMAL LOW (ref 8–23)
CO2: 24 mmol/L (ref 22–32)
Calcium: 7.1 mg/dL — ABNORMAL LOW (ref 8.9–10.3)
Chloride: 102 mmol/L (ref 98–111)
Creatinine, Ser: 0.48 mg/dL (ref 0.44–1.00)
GFR calc Af Amer: >60 mL/min (ref >60)
GFR calc non Af Amer: >60 mL/min (ref >60)
Glucose, Bld: 109 mg/dL — ABNORMAL HIGH (ref 70–99)
Potassium: 3.7 mmol/L (ref 3.5–5.1)
Sodium: 136 mmol/L (ref 135–145)

## 2019-10-08 LAB — MRSA PCR SCREENING: MRSA by PCR: NEGATIVE

## 2019-10-08 LAB — CBC
HCT: 22.4 % — ABNORMAL LOW (ref 36.0–46.0)
Hemoglobin: 7.3 g/dL — ABNORMAL LOW (ref 12.0–15.0)
MCH: 29 pg (ref 26.0–34.0)
MCHC: 32.6 g/dL (ref 30.0–36.0)
MCV: 88.9 fL (ref 80.0–100.0)
Platelets: 134 10*3/uL — ABNORMAL LOW (ref 150–400)
RBC: 2.52 MIL/uL — ABNORMAL LOW (ref 3.87–5.11)
RDW: 15.8 % — ABNORMAL HIGH (ref 11.5–15.5)
WBC: 6.3 10*3/uL (ref 4.0–10.5)
nRBC: 0 % (ref 0.0–0.2)

## 2019-10-08 NOTE — Progress Notes (Signed)
PROGRESS NOTE    Jaime [name]  KVQ:[id number] DOB: Jan 03, 1933 DOA: 09/30/2019 PCP: Mickey Farber, MD   Brief Narrative:  Jaime [name] is an 86 y.o. F with HTN, hypothyroidism and mild dementia home dwelling who presented with confusion and decreased responsiveness/sensorium.  Patient had recently fallen and had a proximal femur fracture, been rehabilitated, then fell again and had a distal femur fracture and being admitted at [facility].  She was in rehab for several weeks, and just discharged a few days before admission here.  Since discharge, the patient had been sleepier and less responsive than usual, until the day of admission when she was impossible to arouse.  In the ER, creatinine 10.98, BUN 207.  She was started on fluids for renal failure and admitted.    Assessment & Plan:   Active Problems:   Sepsis (HCC)   Acute renal failure (HCC)   Failure to thrive in adult   Goals of care, counseling/discussion   Palliative care by specialist   Acute renal failure Metabolic acidosis due to renal failure Uremia due to renal failure Hyperkalemia due to renal failure Patient had foley placed on admission, subsequent renal US normal with mild hydro.  CT abdomen showed moderate hydronephrosis.  Overnight Cr 10.9 > 8.4 > 3.8 > 1.77 > 0.7>0.5>0.48 Mild Acidosis resolved, co2 24 Patient was retaining urine >1000 ml on 10/05/2019, Foley catheter was re- inserted -Creatinine improved, continue IV fluid D51/2 NS at 51ml/hr due to decreased p.o. intake.   -Continue Strict I/Os -Holding telmisartan -Avoid hypotension   Right hydronephrosis CT without stone.  She improved clincally with fluids and antibiotics ( completed for 5 days) Urology consulted, they have deferred stent or repeat imaging. 2/17 Urine retention, Foley catheter reinserted  2/17 Started Flomax 0.4 mg p.o. daily  Hypokalemia Hypophosphatemia -Supplement K and phos  Hypomagnesemia, monitor replete as  needed  Possible UTI with sepsis Presented with hypotension, hypothermia, leukocytosis, altered mental status, and renal failure and elevated lactic acid.  WBC normalized.  Culture with Enterobacter cloacae.  Completed 5 days ceftriaxone.  Reviewed with ID RPh, disagree with Urology, CTX is adequate coverage for E cloacae, especially in a patient clearly improved.     -Complete CTX, monitor fever curve  Hypernatremia, Resolved IMproving -Continue hypotonic fluids, d5 1/2 NS at 50 ml/hr -Trend BMP closely  Acute metabolic encephalopathy due to renal failure superimposed on dementia -PT eval done rec SNf -SLP eval  Atraumatic rhabdo CK resolved  Hypertension Normal -Hold ARB -Continue IV fuids  Hypothyroidism, TSH 5.4 elevated, recheck after 6 weeks, follow PCP -Continue levothyroxine IV until able to take p.o.  Mood disorder -Hold mirtazapine until improved  Recent fracture of left femur x 2, continue fall precautions and supportive care  Elevated troponin Without chest pain, this is due to poor clearance due to renal failure, not ischemia.  No further work up necessary.  Anemia Stable, likely delusional  Severe protein calorie malnutrition As evidenced by diffuse loss of subcutaneous muscle mass and fat, poor oral intake, and low albumin.  DVT prophylaxis: SCD/Compression stockings  Code Status: dnr    Code Status Orders  (From admission, onward)         Start     Ordered   10/01/19 0034  Do not attempt resuscitation (DNR)  Continuous    Question Answer Comment  In the event of cardiac or respiratory ARREST Do not call a "code blue"   In the event of cardiac or respiratory ARREST Do  not perform Intubation, CPR, defibrillation or ACLS   In the event of cardiac or respiratory ARREST Use medication by any route, position, wound care, and other measures to relive pain and suffering. May use oxygen, suction and manual treatment of airway obstruction as  needed for comfort.      10/01/19 0037        Code Status History    Date Active Date Inactive Code Status Order ID Comments User Context   08/26/2019 1434 09/02/2019 1807 Full Code [id number]  Clydie Braun, MD Inpatient   08/26/2019 1317 08/26/2019 1434 Full Code [id number]  Bebe Liter Inpatient   08/21/2019 2129 08/25/2019 0222 Full Code [id number]  Pearson Grippe, MD ED   04/19/2018 1204 04/21/2018 1929 Full Code [id number]  Auburn Bilberry, MD Inpatient   09/11/2015 1421 09/11/2015 1746 Full Code [id number]  Kennedy Bucker, MD Inpatient   Advance Care Planning Activity    Advance Directive Documentation     Most Recent Value  Type of Advance Directive  Healthcare Power of Attorney  Pre-existing out of facility DNR order (yellow form or pink MOST form)  --  "MOST" Form in Place?  --     Family Communication: none today  Disposition Plan:   Patient remained inpatient for continued treatment with IV fluids secondary to decreased p.o. intake.  Continue physical therapy, further evaluation for placement in skilled nursing facility if necessary after appropriately treated for medical complications Consults called: None Admission status: Inpatient   Consultants:   Palliative care  Procedures:  CT ABDOMEN PELVIS WO CONTRAST  Result Date: 10/01/2019 CLINICAL DATA:  hydronephrosis. EXAM: CT ABDOMEN AND PELVIS WITHOUT CONTRAST TECHNIQUE: Multidetector CT imaging of the abdomen and pelvis was performed following the standard protocol without IV contrast. COMPARISON:  Ultrasound 10/01/2019 FINDINGS: Lower chest: Branching density within the right middle lobe likely represents an area of mucous plugging within distal airway. Subsegmental atelectasis versus scar identified within both lung bases. Hepatobiliary: 9 x 4 mm low-density structure within anterior dome of liver is too small to characterize. Segment 5 low-density structure is also too small to characterize measuring 7 mm. Possible sludge within  dependent portion of gallbladder. Pancreas: Unremarkable. No pancreatic ductal dilatation or surrounding inflammatory changes. Spleen: Normal in size without focal abnormality. Adrenals/Urinary Tract: Normal appearance of the adrenal glands. Moderate right hydronephrosis and hydroureter identified corresponding to the ultrasound abnormality. No definite evidence for ureteral lithiasis identified to explain the hydronephrosis. There are several calcified scratch set multiple calcifications are identified along the course of the right ureter which are favored to represent phleboliths. The largest measures approximately 4 mm, image 63/2. A partially decompressed patulous urinary bladder is identified containing gas and a Foley catheter the. No stone identified within the bladder. Within the limitations of unenhanced technique no mass within the bladder noted. There is mild left pelvocaliectasis and hydroureter. Stomach/Bowel: Abnormal wall thickening involving the mid and distal small bowel loops identified concerning for enteritis. The single wall thickness of the terminal ileum measures up to 9 mm. No bowel obstruction. Bowel wall edema with mild surrounding inflammation is noted involving the descending colon, image 60/2. No pathologic dilatation of the colon. No pneumatosis identified. Vascular/Lymphatic: Aortic atherosclerosis identified. No aneurysm. No abdominopelvic adenopathy identified. Reproductive: Unremarkable. Other: No free fluid or fluid collections. Musculoskeletal: Osteopenia and multilevel degenerative disc disease identified within the lumbar spine. Lumbar scoliosis is convex towards the right. Hardware fixation of the proximal left femur fracture noted. IMPRESSION: 1. Moderate right-sided hydronephrosis  and hydroureter identified. Mild left pelvocaliectasis and proximal hydroureter. No kidney stones or ureteral lithiasis identified. 2. Large, partially collapsed, patulous urinary bladder contains  both gas as well as a Foley catheter balloon. No focal bladder calcification or mass noted (within the limitations of unenhanced technique). 3. Suspect enteritis involving the mid and distal small bowel loops and colitis involving the descending colon. No pneumatosis, bowel perforation, or pathologic dilatation of the large or small bowel loops. Aortic Atherosclerosis (ICD10-I70.0). Electronically Signed   By: Kerby Moors M.D.   On: 10/01/2019 10:44   CT Head Wo Contrast  Result Date: 09/30/2019 CLINICAL DATA:  Somnolent, history of recent lung bone fracture EXAM: CT HEAD WITHOUT CONTRAST TECHNIQUE: Contiguous axial images were obtained from the base of the skull through the vertex without intravenous contrast. COMPARISON:  04/19/2018 FINDINGS: Brain: No acute infarct or hemorrhage. Lateral ventricles and midline structures are unremarkable. No acute extra-axial fluid collections. No mass effect. Vascular: No hyperdense vessel or unexpected calcification. Skull: Normal. Negative for fracture or focal lesion. Sinuses/Orbits: No acute finding. Other: None IMPRESSION: 1. Stable head CT, no acute process. Electronically Signed   By: Randa Ngo M.D.   On: 09/30/2019 23:34   US RENAL  Result Date: 10/01/2019 CLINICAL DATA:  19 year old with acute kidney injury. EXAM: RENAL / URINARY TRACT ULTRASOUND COMPLETE COMPARISON:  None. FINDINGS: Right Kidney: Renal measurements: 10.2 x 5.1 x 5.7 cm = volume: 157 mL. Mild hydronephrosis and proximal hydroureter. Mild thinning of the renal parenchyma. No perinephric fluid collection. No suspicious mass or evident calculi. Left Kidney: Renal measurements: 10.1 x 6.0 x 5.6 cm = volume: 177 mL. Echogenicity within normal limits. No mass or hydronephrosis visualized. Bladder: Decompressed by Foley catheter. Other: Incidental layering sludge in the gallbladder. IMPRESSION: 1. Mild right hydronephrosis without identifiable cause. There is mild thinning of the right renal  parenchyma which may indicate chronicity, but is nonspecific. No prior exams available for comparison. 2. Normal sonographic appearance of the left kidney. Electronically Signed   By: Keith Rake M.D.   On: 10/01/2019 02:56   DG Chest Port 1 View  Result Date: 09/30/2019 CLINICAL DATA:  Altered mental status EXAM: PORTABLE CHEST 1 VIEW COMPARISON:  Radiograph 08/25/2019 FINDINGS: Dense bandlike opacity in the right lung base likely reflecting subsegmental atelectasis with associated volume loss in the right hemithorax. No consolidative opacity. No pneumothorax or effusion. The cardiomediastinal contours are stable from prior. No acute osseous or soft tissue abnormality. Degenerative changes are present in the imaged spine and shoulders. Telemetry leads overlie the chest. IMPRESSION: Dense bandlike opacity in the right lung base likely reflecting subsegmental atelectasis. No other acute cardiopulmonary abnormality. Electronically Signed   By: Lovena Le M.D.   On: 09/30/2019 22:48     Antimicrobials:   None currently   Subjective: Acute events overnight resting comfortably in bed Does remain confused   Objective: Vitals:   10/07/19 0333 10/07/19 1506 10/07/19 2105 10/08/19 0628  BP: 128/64 (!) 125/53 (!) 128/53 93/75  Pulse: (!) 102 97 97 98  Resp: 18 16 18 18   Temp: 97.9 F (36.6 C) 98.7 F (37.1 C) 98.4 F (36.9 C) 98.3 F (36.8 C)  TempSrc: Oral Oral Oral Oral  SpO2: 97% 97% 97% 96%  Weight:      Height:        Intake/Output Summary (Last 24 hours) at 10/08/2019 1133 Last data filed at 10/08/2019 1000 Gross per 24 hour  Intake 1322.41 ml  Output 1575 ml  Net -252.59 ml   Filed Weights   09/30/19 2205  Weight: 71 kg    Examination:  General exam: Appears calm and comfortable although confused Respiratory system: Clear to auscultation. Respiratory effort normal. Cardiovascular system: S1 & S2 heard, RRR. No JVD, murmurs, rubs, gallops or clicks. No pedal  edema. Gastrointestinal system: Abdomen is nondistended, soft and nontender. No organomegaly or masses felt. Normal bowel sounds heard. Central nervous system: Alert, is moving all 4 extremities Extremities: Warm well perfused, neurovascularly intact Skin: No rashes, lesions or ulcers Psychiatry: Judgement and insight are impaired known cognitive deficits    Data Reviewed: I have personally reviewed following labs and imaging studies  CBC: Recent Labs  Lab 10/04/19 0532 10/05/19 0908 10/06/19 0610 10/07/19 0341 10/08/19 0451  WBC 8.4 10.1 7.4 9.2 6.3  HGB 8.4* 8.9* 8.5* 8.4* 7.3*  HCT 27.4* 29.7* 26.6* 26.6* 22.4*  MCV 92.9 94.3 90.2 89.9 88.9  PLT 159 144* 129* 156 134*   Basic Metabolic Panel: Recent Labs  Lab 10/04/19 0532 10/04/19 0532 10/05/19 0722 10/05/19 0908 10/06/19 0610 10/07/19 0341 10/08/19 0451  NA 146*   < > 141 141 DUPLICATE REQUEST  140 139 136  K 3.9   < > 3.7 3.6 DUPLICATE REQUEST  3.1* 3.4* 3.7  CL 118*   < > 114* 114* DUPLICATE REQUEST  114* 112* 102  CO2 24   < > 18* 18* DUPLICATE REQUEST  23 22 24   GLUCOSE 122*   < > 122* 128* DUPLICATE REQUEST  121* 115* 109*  BUN 42*   < > 24* 23 DUPLICATE REQUEST  11 7* <5*  CREATININE 1.07*   < > 0.70 0.70 DUPLICATE REQUEST  0.51 0.45 0.48  CALCIUM 7.5*   < > 6.9* 6.9* DUPLICATE REQUEST  6.9* 7.1* 7.1*  MG  --   --   --  1.2* 1.8 1.7  --   PHOS 1.3*  --  2.5 2.5 DUPLICATE REQUEST  1.9* 2.4*  --    < > = values in this interval not displayed.   GFR: Estimated Creatinine Clearance: 51.9 mL/min (by C-G formula based on SCr of 0.48 mg/dL). Liver Function Tests: Recent Labs  Lab 10/02/19 0546 10/03/19 0551 10/04/19 0532 10/05/19 0722 10/06/19 0610  ALBUMIN 1.8* 1.7* 1.7* 1.8* 1.6*   No results for input(s): LIPASE, AMYLASE in the last 168 hours. No results for input(s): AMMONIA in the last 168 hours. Coagulation Profile: No results for input(s): INR, PROTIME in the last 168 hours. Cardiac  Enzymes: Recent Labs  Lab 10/02/19 0546 10/03/19 0551  CKTOTAL 154 91   BNP (last 3 results) No results for input(s): PROBNP in the last 8760 hours. HbA1C: No results for input(s): HGBA1C in the last 72 hours. CBG: No results for input(s): GLUCAP in the last 168 hours. Lipid Profile: No results for input(s): CHOL, HDL, LDLCALC, TRIG, CHOLHDL, LDLDIRECT in the last 72 hours. Thyroid Function Tests: No results for input(s): TSH, T4TOTAL, FREET4, T3FREE, THYROIDAB in the last 72 hours. Anemia Panel: Recent Labs    10/06/19 0810  VITAMINB12 1,369*   Sepsis Labs: No results for input(s): PROCALCITON, LATICACIDVEN in the last 168 hours.  Recent Results (from the past 240 hour(s))  Urine culture     Status: Abnormal   Collection Time: 09/30/19 10:09 PM   Specimen: Urine, Random  Result Value Ref Range Status   Specimen Description   Final    URINE, RANDOM Performed at [facility], [address]  [place], [place] [address]    Special Requests   Final    NONE Performed at [facility], [address] [place], [place] [address]    Culture 80,000 COLONIES/mL ENTEROBACTER CLOACAE (A)  Final   Report Status 10/03/2019 FINAL  Final   Organism ID, Bacteria ENTEROBACTER CLOACAE (A)  Final      Susceptibility   Enterobacter cloacae - MIC*    CEFAZOLIN >=64 RESISTANT Resistant     CIPROFLOXACIN <=0.25 SENSITIVE Sensitive     GENTAMICIN <=1 SENSITIVE Sensitive     IMIPENEM 1 SENSITIVE Sensitive     NITROFURANTOIN 32 SENSITIVE Sensitive     TRIMETH/SULFA <=20 SENSITIVE Sensitive     PIP/TAZO 16 SENSITIVE Sensitive     * 80,000 COLONIES/mL ENTEROBACTER CLOACAE  Respiratory Panel by RT PCR (Flu A&B, Covid) - Urine, Clean Catch     Status: None   Collection Time: 09/30/19 10:09 PM   Specimen: Urine, Clean Catch  Result Value Ref Range Status   SARS Coronavirus 2 by RT PCR NEGATIVE NEGATIVE Final    Comment: (NOTE) SARS-CoV-2 target nucleic acids are NOT  DETECTED. The SARS-CoV-2 RNA is generally detectable in upper respiratoy specimens during the acute phase of infection. The lowest concentration of SARS-CoV-2 viral copies this assay can detect is 131 copies/mL. A negative result does not preclude SARS-Cov-2 infection and should not be used as the sole basis for treatment or other patient management decisions. A negative result may occur with  improper specimen collection/handling, submission of specimen other than nasopharyngeal swab, presence of viral mutation(s) within the areas targeted by this assay, and inadequate number of viral copies (<131 copies/mL). A negative result must be combined with clinical observations, patient history, and epidemiological information. The expected result is Negative. Fact Sheet for Patients:  https://www.moore.com/https://www.fda.gov/media/142436/download Fact Sheet for Healthcare Providers:  https://www.young.biz/https://www.fda.gov/media/142435/download This test is not yet ap proved or cleared by the [place] FDA and  has been authorized for detection and/or diagnosis of SARS-CoV-2 by FDA under an Emergency Use Authorization (EUA). This EUA will remain  in effect (meaning this test can be used) for the duration of the COVID-19 declaration under Section 564(b)(1) of the Act, 21 U.S.C. section 360bbb-3(b)(1), unless the authorization is terminated or revoked sooner.    Influenza A by PCR NEGATIVE NEGATIVE Final   Influenza B by PCR NEGATIVE NEGATIVE Final    Comment: (NOTE) The Xpert Xpress SARS-CoV-2/FLU/RSV assay is intended as an aid in  the diagnosis of influenza from Nasopharyngeal swab specimens and  should not be used as a sole basis for treatment. Nasal washings and  aspirates are unacceptable for Xpert Xpress SARS-CoV-2/FLU/RSV  testing. Fact Sheet for Patients: https://www.moore.com/https://www.fda.gov/media/142436/download Fact Sheet for Healthcare Providers: https://www.young.biz/https://www.fda.gov/media/142435/download This test is not yet approved or cleared  by the [place] FDA and  has been authorized for detection and/or diagnosis of SARS-CoV-2 by  FDA under an Emergency Use Authorization (EUA). This EUA will remain  in effect (meaning this test can be used) for the duration of the  Covid-19 declaration under Section 564(b)(1) of the Act, 21  U.S.C. section 360bbb-3(b)(1), unless the authorization is  terminated or revoked. Performed at [facility], [address] [place], [place] [address]   Culture, blood (routine x 2)     Status: None   Collection Time: 09/30/19 10:31 PM   Specimen: Left Antecubital; Blood  Result Value Ref Range Status   Specimen Description LEFT ANTECUBITAL  Final   Special Requests Blood Culture adequate  volume  Final   Culture   Final    NO GROWTH 5 DAYS Performed at [facility], [address] [place], [place] [address]    Report Status 10/05/2019 FINAL  Final  Culture, blood (routine x 2)     Status: None   Collection Time: 09/30/19 10:36 PM   Specimen: BLOOD LEFT ARM  Result Value Ref Range Status   Specimen Description BLOOD LEFT ARM  Final   Special Requests Blood Culture adequate volume  Final   Culture   Final    NO GROWTH 5 DAYS Performed at [facility], [address] [place], [place] [address]    Report Status 10/05/2019 FINAL  Final         Radiology Studies: No results found.      Scheduled Meds: . calcium carbonate  500 mg of elemental calcium Oral BID WC  . chlorhexidine  15 mL Mouth Rinse BID  . Chlorhexidine Gluconate Cloth  6 each Topical Daily  . collagenase  1 application Topical Daily  . levothyroxine  75 mcg Intravenous QAC breakfast  . mouth rinse  15 mL Mouth Rinse q12n4p  . multivitamin with minerals  1 tablet Oral Daily  . tamsulosin  0.4 mg Oral Daily   Continuous Infusions: . sodium chloride Stopped (10/06/19 0513)  . dextrose 5 % and 0.45% NaCl 50 mL/hr at 10/08/19 0700     LOS: 7 days    Time spent: 35  min    Burke Keels, MD [facility]  If 7PM-7AM, please contact night-coverage  10/08/2019, 11:33 AM

## 2019-10-08 NOTE — Plan of Care (Signed)
Patient is alert to self only and has a very poor appetite. She is being turned every 2 hours to avoid added pressure on her sacrum which has a pressure injury.  Will continue to monitor.  Arturo Morton

## 2019-10-08 NOTE — Progress Notes (Addendum)
SLP Cancellation Note  Patient Details Name: Jaime [name] MRN: [id number] DOB: 1932/09/29   Cancelled treatment:       Reason Eval/Treat Not Completed: Patient declined, no reason specified; Patient presenting reclined in bed with eyes open. When offered to assess swallowing function today with foods/liquids, patient declined and stated "I just ate today". Even with additional encouragement, patient said, "I am fine with my meals." Spoke with NSG today. NSG indicated no issues with current diet at this time. Education provided re: continue safe swallowing strategies recommended by evaluating SLP. Skilled ST services not provided today. Evaluating SLP to re-assess and f/u at later date if/when patient is more agreeable.   Everlean Patterson, M.S. CCC-SLP Speech-Language Pathologist  Everlean Patterson 10/08/2019, 11:26 AM

## 2019-10-09 NOTE — Plan of Care (Signed)
Continuing with plan of care. 

## 2019-10-09 NOTE — Progress Notes (Signed)
PROGRESS NOTE    Jaime [name]  YFR:[id number] DOB: 08-17-1933 DOA: 09/30/2019 PCP: Mickey Farber, MD   Brief Narrative:  Jaime [name] is an 86 y.o. F with HTN, hypothyroidism and mild dementia home dwelling who presented with confusion and decreased responsiveness/sensorium.  Patient had recently fallen and had a proximal femur fracture, been rehabilitated, then fell again and had a distal femur fracture and being admitted at [facility]. She was in rehab for several weeks, and just discharged a few days before admission here.  Since discharge, the patient had been sleepier and less responsive than usual, until the day of admission when she was impossible to arouse.  In the ER, creatinine 10.98, BUN 207. She was started on fluids for renal failure and admitted.  Plan is for discharge to SNF with hospice, likely monday   Assessment & Plan:   Active Problems:   Sepsis (HCC)   Acute renal failure (HCC)   Failure to thrive in adult   Goals of care, counseling/discussion   Palliative care by specialist   Acute renal failure Metabolic acidosis due to renal failure Uremia due to renal failure Hyperkalemia due to renal failure Patient had foley placed on admission, subsequent renal US normal with mild hydro. CT abdomen showed moderate hydronephrosis.  Overnight Cr 10.9 >8.4 >3.8 >1.77 >0.7>0.5>0.48 Mild Acidosis resolved, co2 24 Patient was retaining urine >1000 ml on 10/05/2019, Foley catheter was re- inserted -Creatinine improved,continueIV fluid D51/2NS at 73ml/hr due to decreased p.o. intake.  -Continue Strict I/Os -Holding telmisartan -Avoid hypotension   Right hydronephrosis CT without stone. She improved clincally with fluids and antibiotics ( completed for 5 days) Urology consulted, they have deferred stent or repeat imaging. 2/17 Urine retention, Foley catheter reinserted  2/17 Started Flomax 0.4 mg p.o.  daily  Hypokalemia Hypophosphatemia -Supplement K and phos  Hypomagnesemia,monitor replete as needed  Possible UTI with sepsis Presented with hypotension, hypothermia, leukocytosis, altered mental status, and renal failure and elevated lactic acid.  WBC normalized. Culture with Enterobacter cloacae. Completed 5 days ceftriaxone. Reviewed with ID RPh, disagree with Urology, CTX is adequate coverage for E cloacae, especially in a patient clearly improved.  -Complete CTX, monitor fever curve  Hypernatremia, Resolved IMproving -Continue hypotonic fluids, d5 1/2 NS at 50 ml/hr -Trend BMP closely  Acute metabolic encephalopathy due to renal failure superimposed on dementia -PT eval done rec SNF with hospice -SLP eval  Atraumatic rhabdo CK resolved  Hypertension Normal -Hold ARB -Continue IV fuids  Hypothyroidism, TSH 5.4 elevated, recheck after 6 weeks, follow PCP -Continue levothyroxine IV until able to take p.o.  Mood disorder -Hold mirtazapine until improved  Recent fracture of left femur x 2,continue fall precautions and supportive care  Elevated troponin Without chest pain, this is due to poor clearance due to renal failure, not ischemia. No further work up necessary.  Anemia Stable, likely delusional  Severe protein calorie malnutrition As evidenced by diffuse loss of subcutaneous muscle mass and fat, poor oral intake, and low albumin.  DVT prophylaxis: SCD/Compression stockings  Code Status: DNR    Code Status Orders  (From admission, onward)         Start     Ordered   10/01/19 0034  Do not attempt resuscitation (DNR)  Continuous    Question Answer Comment  In the event of cardiac or respiratory ARREST Do not call a "code blue"   In the event of cardiac or respiratory ARREST Do not perform Intubation, CPR, defibrillation or ACLS  In the event of cardiac or respiratory ARREST Use medication by any route, position, wound care, and  other measures to relive pain and suffering. May use oxygen, suction and manual treatment of airway obstruction as needed for comfort.      10/01/19 0037        Code Status History    Date Active Date Inactive Code Status Order ID Comments User Context   08/26/2019 1434 09/02/2019 1807 Full Code [id number]  Norval Morton, MD Inpatient   08/26/2019 1317 08/26/2019 1434 Full Code [id number]  Vivien Rota Inpatient   08/21/2019 2129 08/25/2019 0222 Full Code [id number]  Jani Gravel, MD ED   04/19/2018 1204 04/21/2018 1929 Full Code [id number]  Dustin Flock, MD Inpatient   09/11/2015 1421 09/11/2015 1746 Full Code [id number]  Hessie Knows, MD Inpatient   Advance Care Planning Activity    Advance Directive Documentation     Most Recent Value  Type of Advance Directive  Healthcare Power of Attorney  Pre-existing out of facility DNR order (yellow form or pink MOST form)  --  "MOST" Form in Place?  --     Family Communication: None today, son is involved in decision for skilled nursing facility with hospice at SNF.  They are aware of plan and it was their preference Disposition Plan:   Patient remains hospital additional day Consults called: None Admission status: Inpatient   Consultants:   palliative care  Procedures:  CT ABDOMEN PELVIS WO CONTRAST  Result Date: 10/01/2019 CLINICAL DATA:  hydronephrosis. EXAM: CT ABDOMEN AND PELVIS WITHOUT CONTRAST TECHNIQUE: Multidetector CT imaging of the abdomen and pelvis was performed following the standard protocol without IV contrast. COMPARISON:  Ultrasound 10/01/2019 FINDINGS: Lower chest: Branching density within the right middle lobe likely represents an area of mucous plugging within distal airway. Subsegmental atelectasis versus scar identified within both lung bases. Hepatobiliary: 9 x 4 mm low-density structure within anterior dome of liver is too small to characterize. Segment 5 low-density structure is also too small to characterize measuring 7  mm. Possible sludge within dependent portion of gallbladder. Pancreas: Unremarkable. No pancreatic ductal dilatation or surrounding inflammatory changes. Spleen: Normal in size without focal abnormality. Adrenals/Urinary Tract: Normal appearance of the adrenal glands. Moderate right hydronephrosis and hydroureter identified corresponding to the ultrasound abnormality. No definite evidence for ureteral lithiasis identified to explain the hydronephrosis. There are several calcified scratch set multiple calcifications are identified along the course of the right ureter which are favored to represent phleboliths. The largest measures approximately 4 mm, image 63/2. A partially decompressed patulous urinary bladder is identified containing gas and a Foley catheter the. No stone identified within the bladder. Within the limitations of unenhanced technique no mass within the bladder noted. There is mild left pelvocaliectasis and hydroureter. Stomach/Bowel: Abnormal wall thickening involving the mid and distal small bowel loops identified concerning for enteritis. The single wall thickness of the terminal ileum measures up to 9 mm. No bowel obstruction. Bowel wall edema with mild surrounding inflammation is noted involving the descending colon, image 60/2. No pathologic dilatation of the colon. No pneumatosis identified. Vascular/Lymphatic: Aortic atherosclerosis identified. No aneurysm. No abdominopelvic adenopathy identified. Reproductive: Unremarkable. Other: No free fluid or fluid collections. Musculoskeletal: Osteopenia and multilevel degenerative disc disease identified within the lumbar spine. Lumbar scoliosis is convex towards the right. Hardware fixation of the proximal left femur fracture noted. IMPRESSION: 1. Moderate right-sided hydronephrosis and hydroureter identified. Mild left pelvocaliectasis and proximal hydroureter. No kidney stones or ureteral lithiasis  identified. 2. Large, partially collapsed, patulous  urinary bladder contains both gas as well as a Foley catheter balloon. No focal bladder calcification or mass noted (within the limitations of unenhanced technique). 3. Suspect enteritis involving the mid and distal small bowel loops and colitis involving the descending colon. No pneumatosis, bowel perforation, or pathologic dilatation of the large or small bowel loops. Aortic Atherosclerosis (ICD10-I70.0). Electronically Signed   By: Signa Kell M.D.   On: 10/01/2019 10:44   CT Head Wo Contrast  Result Date: 09/30/2019 CLINICAL DATA:  Somnolent, history of recent lung bone fracture EXAM: CT HEAD WITHOUT CONTRAST TECHNIQUE: Contiguous axial images were obtained from the base of the skull through the vertex without intravenous contrast. COMPARISON:  04/19/2018 FINDINGS: Brain: No acute infarct or hemorrhage. Lateral ventricles and midline structures are unremarkable. No acute extra-axial fluid collections. No mass effect. Vascular: No hyperdense vessel or unexpected calcification. Skull: Normal. Negative for fracture or focal lesion. Sinuses/Orbits: No acute finding. Other: None IMPRESSION: 1. Stable head CT, no acute process. Electronically Signed   By: Sharlet Salina M.D.   On: 09/30/2019 23:34   US RENAL  Result Date: 10/01/2019 CLINICAL DATA:  66 year old with acute kidney injury. EXAM: RENAL / URINARY TRACT ULTRASOUND COMPLETE COMPARISON:  None. FINDINGS: Right Kidney: Renal measurements: 10.2 x 5.1 x 5.7 cm = volume: 157 mL. Mild hydronephrosis and proximal hydroureter. Mild thinning of the renal parenchyma. No perinephric fluid collection. No suspicious mass or evident calculi. Left Kidney: Renal measurements: 10.1 x 6.0 x 5.6 cm = volume: 177 mL. Echogenicity within normal limits. No mass or hydronephrosis visualized. Bladder: Decompressed by Foley catheter. Other: Incidental layering sludge in the gallbladder. IMPRESSION: 1. Mild right hydronephrosis without identifiable cause. There is mild  thinning of the right renal parenchyma which may indicate chronicity, but is nonspecific. No prior exams available for comparison. 2. Normal sonographic appearance of the left kidney. Electronically Signed   By: Narda Rutherford M.D.   On: 10/01/2019 02:56   DG Chest Port 1 View  Result Date: 09/30/2019 CLINICAL DATA:  Altered mental status EXAM: PORTABLE CHEST 1 VIEW COMPARISON:  Radiograph 08/25/2019 FINDINGS: Dense bandlike opacity in the right lung base likely reflecting subsegmental atelectasis with associated volume loss in the right hemithorax. No consolidative opacity. No pneumothorax or effusion. The cardiomediastinal contours are stable from prior. No acute osseous or soft tissue abnormality. Degenerative changes are present in the imaged spine and shoulders. Telemetry leads overlie the chest. IMPRESSION: Dense bandlike opacity in the right lung base likely reflecting subsegmental atelectasis. No other acute cardiopulmonary abnormality. Electronically Signed   By: Kreg Shropshire M.D.   On: 09/30/2019 22:48     Antimicrobials:   none    Subjective: No acute changes or events overnight  Objective: Vitals:   10/08/19 0628 10/08/19 1152 10/08/19 2115 10/09/19 0416  BP: 93/75 (!) 143/66 133/60 (!) 122/56  Pulse: 98 96 94 93  Resp: 18 20 20 20   Temp: 98.3 F (36.8 C) 98.2 F (36.8 C) 97.8 F (36.6 C) 98.2 F (36.8 C)  TempSrc: Oral Oral Oral Oral  SpO2: 96% 95% 97% 96%  Weight:      Height:        Intake/Output Summary (Last 24 hours) at 10/09/2019 1222 Last data filed at 10/09/2019 1100 Gross per 24 hour  Intake 1155 ml  Output 5175 ml  Net -4020 ml   Filed Weights   09/30/19 2205  Weight: 71 kg    Examination:  General  exam: minimally interactive, chronic ill appearing  Respiratory system: Clear to auscultation. Respiratory effort normal. Cardiovascular system: S1 & S2 heard, RRR. No JVD, murmurs, rubs, gallops or clicks. No pedal edema. Gastrointestinal system:  Abdomen is nondistended, soft and nontender. No organomegaly or masses felt. Normal bowel sounds heard. Central nervous system: Alert and oriented. No focal neurological deficits. Extremities: wwp, nv intact Skin: No rashes, lesions or ulcers Psychiatry: Judgement and insight appear normal. Mood & affect appropriate.     Data Reviewed: I have personally reviewed following labs and imaging studies  CBC: Recent Labs  Lab 10/04/19 0532 10/05/19 0908 10/06/19 0610 10/07/19 0341 10/08/19 0451  WBC 8.4 10.1 7.4 9.2 6.3  HGB 8.4* 8.9* 8.5* 8.4* 7.3*  HCT 27.4* 29.7* 26.6* 26.6* 22.4*  MCV 92.9 94.3 90.2 89.9 88.9  PLT 159 144* 129* 156 134*   Basic Metabolic Panel: Recent Labs  Lab 10/04/19 0532 10/04/19 0532 10/05/19 0722 10/05/19 0908 10/06/19 0610 10/07/19 0341 10/08/19 0451  NA 146*   < > 141 141 DUPLICATE REQUEST  140 139 136  K 3.9   < > 3.7 3.6 DUPLICATE REQUEST  3.1* 3.4* 3.7  CL 118*   < > 114* 114* DUPLICATE REQUEST  114* 112* 102  CO2 24   < > 18* 18* DUPLICATE REQUEST  23 22 24   GLUCOSE 122*   < > 122* 128* DUPLICATE REQUEST  121* 115* 109*  BUN 42*   < > 24* 23 DUPLICATE REQUEST  11 7* <5*  CREATININE 1.07*   < > 0.70 0.70 DUPLICATE REQUEST  0.51 0.45 0.48  CALCIUM 7.5*   < > 6.9* 6.9* DUPLICATE REQUEST  6.9* 7.1* 7.1*  MG  --   --   --  1.2* 1.8 1.7  --   PHOS 1.3*  --  2.5 2.5 DUPLICATE REQUEST  1.9* 2.4*  --    < > = values in this interval not displayed.   GFR: Estimated Creatinine Clearance: 51.9 mL/min (by C-G formula based on SCr of 0.48 mg/dL). Liver Function Tests: Recent Labs  Lab 10/03/19 0551 10/04/19 0532 10/05/19 0722 10/06/19 0610  ALBUMIN 1.7* 1.7* 1.8* 1.6*   No results for input(s): LIPASE, AMYLASE in the last 168 hours. No results for input(s): AMMONIA in the last 168 hours. Coagulation Profile: No results for input(s): INR, PROTIME in the last 168 hours. Cardiac Enzymes: Recent Labs  Lab 10/03/19 0551  CKTOTAL 91    BNP (last 3 results) No results for input(s): PROBNP in the last 8760 hours. HbA1C: No results for input(s): HGBA1C in the last 72 hours. CBG: No results for input(s): GLUCAP in the last 168 hours. Lipid Profile: No results for input(s): CHOL, HDL, LDLCALC, TRIG, CHOLHDL, LDLDIRECT in the last 72 hours. Thyroid Function Tests: No results for input(s): TSH, T4TOTAL, FREET4, T3FREE, THYROIDAB in the last 72 hours. Anemia Panel: No results for input(s): VITAMINB12, FOLATE, FERRITIN, TIBC, IRON, RETICCTPCT in the last 72 hours. Sepsis Labs: No results for input(s): PROCALCITON, LATICACIDVEN in the last 168 hours.  Recent Results (from the past 240 hour(s))  Urine culture     Status: Abnormal   Collection Time: 09/30/19 10:09 PM   Specimen: Urine, Random  Result Value Ref Range Status   Specimen Description   Final    URINE, RANDOM Performed at [facility], [address] [place], [place] [address]    Special Requests   Final    NONE Performed at [facility], [address] [place], [place] [address]    Culture 80,000 COLONIES/mL ENTEROBACTER CLOACAE (A)  Final   Report Status 10/03/2019 FINAL  Final   Organism ID, Bacteria ENTEROBACTER CLOACAE (A)  Final      Susceptibility   Enterobacter cloacae - MIC*    CEFAZOLIN >=64 RESISTANT Resistant     CIPROFLOXACIN <=0.25 SENSITIVE Sensitive     GENTAMICIN <=1 SENSITIVE Sensitive     IMIPENEM 1 SENSITIVE Sensitive     NITROFURANTOIN 32 SENSITIVE Sensitive     TRIMETH/SULFA <=20 SENSITIVE Sensitive     PIP/TAZO 16 SENSITIVE Sensitive     * 80,000 COLONIES/mL ENTEROBACTER CLOACAE  Respiratory Panel by RT PCR (Flu A&B, Covid) - Urine, Clean Catch     Status: None   Collection Time: 09/30/19 10:09 PM   Specimen: Urine, Clean Catch  Result Value Ref Range Status   SARS Coronavirus 2 by RT PCR NEGATIVE NEGATIVE Final    Comment: (NOTE) SARS-CoV-2 target nucleic acids are NOT DETECTED. The SARS-CoV-2 RNA is  generally detectable in upper respiratoy specimens during the acute phase of infection. The lowest concentration of SARS-CoV-2 viral copies this assay can detect is 131 copies/mL. A negative result does not preclude SARS-Cov-2 infection and should not be used as the sole basis for treatment or other patient management decisions. A negative result may occur with  improper specimen collection/handling, submission of specimen other than nasopharyngeal swab, presence of viral mutation(s) within the areas targeted by this assay, and inadequate number of viral copies (<131 copies/mL). A negative result must be combined with clinical observations, patient history, and epidemiological information. The expected result is Negative. Fact Sheet for Patients:  https://www.moore.com/ Fact Sheet for Healthcare Providers:  https://www.young.biz/ This test is not yet ap proved or cleared by the [place] FDA and  has been authorized for detection and/or diagnosis of SARS-CoV-2 by FDA under an Emergency Use Authorization (EUA). This EUA will remain  in effect (meaning this test can be used) for the duration of the COVID-19 declaration under Section 564(b)(1) of the Act, 21 U.S.C. section 360bbb-3(b)(1), unless the authorization is terminated or revoked sooner.    Influenza A by PCR NEGATIVE NEGATIVE Final   Influenza B by PCR NEGATIVE NEGATIVE Final    Comment: (NOTE) The Xpert Xpress SARS-CoV-2/FLU/RSV assay is intended as an aid in  the diagnosis of influenza from Nasopharyngeal swab specimens and  should not be used as a sole basis for treatment. Nasal washings and  aspirates are unacceptable for Xpert Xpress SARS-CoV-2/FLU/RSV  testing. Fact Sheet for Patients: https://www.moore.com/ Fact Sheet for Healthcare Providers: https://www.young.biz/ This test is not yet approved or cleared by the [place] FDA and   has been authorized for detection and/or diagnosis of SARS-CoV-2 by  FDA under an Emergency Use Authorization (EUA). This EUA will remain  in effect (meaning this test can be used) for the duration of the  Covid-19 declaration under Section 564(b)(1) of the Act, 21  U.S.C. section 360bbb-3(b)(1), unless the authorization is  terminated or revoked. Performed at [facility], [address] [place], [place] [address]   Culture, blood (routine x 2)     Status: None   Collection Time: 09/30/19 10:31 PM   Specimen: Left Antecubital; Blood  Result Value Ref Range Status   Specimen Description LEFT ANTECUBITAL  Final   Special Requests Blood Culture adequate volume  Final   Culture   Final    NO GROWTH 5 DAYS Performed at [facility], [address] [place], [place] [address]    Report Status 10/05/2019 FINAL  Final  Culture, blood (routine x 2)     Status: None   Collection Time: 09/30/19 10:36 PM   Specimen: BLOOD LEFT ARM  Result Value Ref Range Status   Specimen Description BLOOD LEFT ARM  Final   Special Requests Blood Culture adequate volume  Final   Culture   Final    NO GROWTH 5 DAYS Performed at [facility], [address] [place], [place] [address]    Report Status 10/05/2019 FINAL  Final  MRSA PCR Screening     Status: None   Collection Time: 10/08/19  7:00 PM   Specimen: Nasopharyngeal  Result Value Ref Range Status   MRSA by PCR NEGATIVE NEGATIVE Final    Comment:        The GeneXpert MRSA Assay (FDA approved for NASAL specimens only), is one component of a comprehensive MRSA colonization surveillance program. It is not intended to diagnose MRSA infection nor to guide or monitor treatment for MRSA infections. Performed at [facility], [address] [place], [place] [address]          Radiology Studies: No results found.      Scheduled Meds: . calcium carbonate  500 mg of elemental calcium Oral BID WC   . chlorhexidine  15 mL Mouth Rinse BID  . Chlorhexidine Gluconate Cloth  6 each Topical Daily  . collagenase  1 application Topical Daily  . levothyroxine  75 mcg Intravenous QAC breakfast  . mouth rinse  15 mL Mouth Rinse q12n4p  . multivitamin with minerals  1 tablet Oral Daily  . tamsulosin  0.4 mg Oral Daily   Continuous Infusions: . sodium chloride Stopped (10/06/19 0513)  . dextrose 5 % and 0.45% NaCl 50 mL/hr at 10/09/19 1100     LOS: 8 days    Time spent: 35 min    Burke Keels, MD [facility]  If 7PM-7AM, please contact night-coverage  10/09/2019, 12:22 PM  \

## 2019-10-10 DIAGNOSIS — E860 Dehydration: Secondary | ICD-10-CM

## 2019-10-10 DIAGNOSIS — G9341 Metabolic encephalopathy: Secondary | ICD-10-CM | POA: Diagnosis present

## 2019-10-10 DIAGNOSIS — E876 Hypokalemia: Secondary | ICD-10-CM

## 2019-10-10 DIAGNOSIS — E43 Unspecified severe protein-calorie malnutrition: Secondary | ICD-10-CM

## 2019-10-10 LAB — GLUCOSE, CAPILLARY: Glucose-Capillary: 117 mg/dL — ABNORMAL HIGH (ref 70–99)

## 2019-10-10 MED ORDER — TAMSULOSIN HCL 0.4 MG PO CAPS: 0.4000 mg | ORAL_CAPSULE | Freq: Every day | ORAL | 0 refills | Status: AC

## 2019-10-10 MED ORDER — ADULT MULTIVITAMIN W/MINERALS CH: 1.0000 | ORAL_TABLET | Freq: Every day | ORAL | 0 refills | Status: AC

## 2019-10-10 NOTE — Progress Notes (Signed)
Derricka Dismuke Mottola A and O x1. VSS. Pt tolerating diet well. No complaints of nausea or vomiting. IV removed intact, prescriptions given.  Discharge packet sent with patient and went over with [name]. Patient left via stretcher with EMS  Allergies as of 10/10/2019      Reactions   Memantine Other (See Comments)   Worsened confusion   Donepezil Other (See Comments)   Poor appetite, weight loss      Medication List    STOP taking these medications   cyanocobalamin 1000 MCG tablet   docusate sodium 100 MG capsule Commonly known as: COLACE   enoxaparin 40 MG/0.4ML injection Commonly known as: LOVENOX   gabapentin 100 MG capsule Commonly known as: NEURONTIN   HYDROcodone-acetaminophen 5-325 MG tablet Commonly known as: NORCO/VICODIN   methocarbamol 500 MG tablet Commonly known as: ROBAXIN   mirtazapine 15 MG tablet Commonly known as: REMERON   ondansetron 4 MG tablet Commonly known as: ZOFRAN   polyethylene glycol 17 g packet Commonly known as: MiraLax   telmisartan 80 MG tablet Commonly known as: MICARDIS     TAKE these medications   Cholecalciferol 50 MCG (2000 UT) Caps Take 1 capsule (2,000 Units total) by mouth 2 (two) times daily.   levothyroxine 137 MCG tablet Commonly known as: SYNTHROID Take 137 mcg by mouth daily before breakfast.   multivitamin with minerals Tabs tablet Take 1 tablet by mouth daily. Start taking on: October 11, 2019   tamsulosin 0.4 MG Caps capsule Commonly known as: FLOMAX Take 1 capsule (0.4 mg total) by mouth daily. Start taking on: October 11, 2019       Vitals:   10/10/19 0800 10/10/19 1148  BP: (!) 131/55 (!) 129/54  Pulse: 87 98  Resp: 18 16  Temp: 98 F (36.7 C) 98 F (36.7 C)  SpO2: 94% 100%    Bertha Stakes

## 2019-10-10 NOTE — Discharge Summary (Addendum)
Physician Discharge Summary  Jaime [name] AVW:[id number] DOB: 11/08/1932 DOA: 09/30/2019  PCP: Mickey Farberhies, David, MD  Admit date: 09/30/2019 Discharge date: 10/10/2019  Admitted From: Home Disposition: Home  Recommendations for Outpatient Follow-up:  1. Follow up with PCP/home hospice within 1 week  Home Health: None Equipment/Devices: None  Discharge Condition: Guarded CODE STATUS: DNR Diet recommendation: Dysphagia level 1 with honey thick liquid    Discharge Diagnoses:  Principal Problem:   Sepsis (HCC)   Active Problems:   Protein-calorie malnutrition, severe   Dementia with behavioral disturbance (HCC)   Acute renal failure (HCC)   Failure to thrive in adult   Goals of care, counseling/discussion   Palliative care by specialist   Dehydration   Acute metabolic encephalopathy Acute lower UTI Hypokalemia Hypophosphatemia Hypernatremia Atraumatic rhabdomyolysis  Brief narrative/HPI Please refer to admission H&P for details, in brief, 86 year old female with history of hypertension, hypothyroidism, moderate dementia brought from home with confusion, decreased responsiveness.  Patient recently had a proximal femur fracture and had been in rehab.  She fell again and had a distal femur fracture and was [facility] to [facility].  She was in rehab for several weeks and just discharged few days prior to admission.  Since her discharge home she has been increasingly sleepy and poorly responsive.  On the day of admission she was unarousable. In the ED she had acute kidney injury with BUN of 207 and creatinine of 10.98.  She was hypernatremic with sodium of 149, potassium of 5.9, lactic acid of 2.5.  Tested negative for flu and COVID-19.  UA with positive for UTI.  Head CT without acute findings. Patient placed on empiric antibiotic and IV fluid bolus for sepsis.  Hospital course Acute tubular necrosis  with uremia and metabolic acidosis Foley placed on admission. ATN  appears to be post renal/ post obstructive.  Renal ultrasound showed mild hydronephrosis.  Renal function improved to normal and acidosis resolved. Foley catheter was reinserted due to urinary retention and will be discharged on it.  Blood pressure medication discontinued due to hypotension.  Severe protein calorie malnutrition and failure to thrive Patient had persistent encephalopathy with underlying dementia.  Seen by palliative care and recommended SNF with hospice follow-up.  Family now wants to take patient home with hospice. Overall prognosis is guarded.  On dysphagia level 1 diet.  Has not required any narcotics.   Active problems Severe sepsis with acute organ dysfunction/ ATN secondary to UTI. Urine culture growing Enterobacter.  Sepsis resolved.  Completed 5 days of IV Rocephin.  Hypernatremia Secondary to severe dehydration.  Improved with fluids  Hypokalemia/hypophosphatemia/hypomagnesemia Replenished  Rhabdomyolysis, nontraumatic Resolved with fluids  Hypertension ARB held.  Mood disorder Remeron discontinued due to encephalopathy   hypothyroidism TSH of 5.4.  Continue Synthroid at home dose.  Monitor TSH in 6 weeks.  Right hydronephrosis with urinary retention CT abdomen without stone.  Renal function improved with fluids and antibiotic.  Urology consult appreciated and did not recommend stent or repeat imaging.  Foley catheter inserted for urinary retention and started on Flomax.  Patient will be discharged with the Foley has now bolus for comfort..  Severe protein calorie malnutrition  Recent fracture of left femur x2 Provide supportive care.   Disposition: Family wishes to take patient home with hospice  Procedure: CT abdomen  Family communication: None  Overall prognosis is guarded.   Discharge Instructions   Allergies as of 10/10/2019      Reactions   Memantine Other (See Comments)  Worsened confusion   Donepezil Other (See Comments)   Poor  appetite, weight loss      Medication List    STOP taking these medications   cyanocobalamin 1000 MCG tablet   docusate sodium 100 MG capsule Commonly known as: COLACE   enoxaparin 40 MG/0.4ML injection Commonly known as: LOVENOX   gabapentin 100 MG capsule Commonly known as: NEURONTIN   HYDROcodone-acetaminophen 5-325 MG tablet Commonly known as: NORCO/VICODIN   methocarbamol 500 MG tablet Commonly known as: ROBAXIN   mirtazapine 15 MG tablet Commonly known as: REMERON   ondansetron 4 MG tablet Commonly known as: ZOFRAN   polyethylene glycol 17 g packet Commonly known as: MiraLax   telmisartan 80 MG tablet Commonly known as: MICARDIS     TAKE these medications   Cholecalciferol 50 MCG (2000 UT) Caps Take 1 capsule (2,000 Units total) by mouth 2 (two) times daily.   levothyroxine 137 MCG tablet Commonly known as: SYNTHROID Take 137 mcg by mouth daily before breakfast.   multivitamin with minerals Tabs tablet Take 1 tablet by mouth daily. Start taking on: October 11, 2019   tamsulosin 0.4 MG Caps capsule Commonly known as: FLOMAX Take 1 capsule (0.4 mg total) by mouth daily. Start taking on: October 11, 2019      Follow-up Information    home hospice Follow up in 1 week(s).          Allergies  Allergen Reactions  . Memantine Other (See Comments)    Worsened confusion  . Donepezil Other (See Comments)    Poor appetite, weight loss        Procedures/Studies: CT ABDOMEN PELVIS WO CONTRAST  Result Date: 10/01/2019 CLINICAL DATA:  hydronephrosis. EXAM: CT ABDOMEN AND PELVIS WITHOUT CONTRAST TECHNIQUE: Multidetector CT imaging of the abdomen and pelvis was performed following the standard protocol without IV contrast. COMPARISON:  Ultrasound 10/01/2019 FINDINGS: Lower chest: Branching density within the right middle lobe likely represents an area of mucous plugging within distal airway. Subsegmental atelectasis versus scar identified within both  lung bases. Hepatobiliary: 9 x 4 mm low-density structure within anterior dome of liver is too small to characterize. Segment 5 low-density structure is also too small to characterize measuring 7 mm. Possible sludge within dependent portion of gallbladder. Pancreas: Unremarkable. No pancreatic ductal dilatation or surrounding inflammatory changes. Spleen: Normal in size without focal abnormality. Adrenals/Urinary Tract: Normal appearance of the adrenal glands. Moderate right hydronephrosis and hydroureter identified corresponding to the ultrasound abnormality. No definite evidence for ureteral lithiasis identified to explain the hydronephrosis. There are several calcified scratch set multiple calcifications are identified along the course of the right ureter which are favored to represent phleboliths. The largest measures approximately 4 mm, image 63/2. A partially decompressed patulous urinary bladder is identified containing gas and a Foley catheter the. No stone identified within the bladder. Within the limitations of unenhanced technique no mass within the bladder noted. There is mild left pelvocaliectasis and hydroureter. Stomach/Bowel: Abnormal wall thickening involving the mid and distal small bowel loops identified concerning for enteritis. The single wall thickness of the terminal ileum measures up to 9 mm. No bowel obstruction. Bowel wall edema with mild surrounding inflammation is noted involving the descending colon, image 60/2. No pathologic dilatation of the colon. No pneumatosis identified. Vascular/Lymphatic: Aortic atherosclerosis identified. No aneurysm. No abdominopelvic adenopathy identified. Reproductive: Unremarkable. Other: No free fluid or fluid collections. Musculoskeletal: Osteopenia and multilevel degenerative disc disease identified within the lumbar spine. Lumbar scoliosis is convex towards the right.  Hardware fixation of the proximal left femur fracture noted. IMPRESSION: 1. Moderate  right-sided hydronephrosis and hydroureter identified. Mild left pelvocaliectasis and proximal hydroureter. No kidney stones or ureteral lithiasis identified. 2. Large, partially collapsed, patulous urinary bladder contains both gas as well as a Foley catheter balloon. No focal bladder calcification or mass noted (within the limitations of unenhanced technique). 3. Suspect enteritis involving the mid and distal small bowel loops and colitis involving the descending colon. No pneumatosis, bowel perforation, or pathologic dilatation of the large or small bowel loops. Aortic Atherosclerosis (ICD10-I70.0). Electronically Signed   By: Signa Kell M.D.   On: 10/01/2019 10:44   CT Head Wo Contrast  Result Date: 09/30/2019 CLINICAL DATA:  Somnolent, history of recent lung bone fracture EXAM: CT HEAD WITHOUT CONTRAST TECHNIQUE: Contiguous axial images were obtained from the base of the skull through the vertex without intravenous contrast. COMPARISON:  04/19/2018 FINDINGS: Brain: No acute infarct or hemorrhage. Lateral ventricles and midline structures are unremarkable. No acute extra-axial fluid collections. No mass effect. Vascular: No hyperdense vessel or unexpected calcification. Skull: Normal. Negative for fracture or focal lesion. Sinuses/Orbits: No acute finding. Other: None IMPRESSION: 1. Stable head CT, no acute process. Electronically Signed   By: Sharlet Salina M.D.   On: 09/30/2019 23:34   US RENAL  Result Date: 10/01/2019 CLINICAL DATA:  35 year old with acute kidney injury. EXAM: RENAL / URINARY TRACT ULTRASOUND COMPLETE COMPARISON:  None. FINDINGS: Right Kidney: Renal measurements: 10.2 x 5.1 x 5.7 cm = volume: 157 mL. Mild hydronephrosis and proximal hydroureter. Mild thinning of the renal parenchyma. No perinephric fluid collection. No suspicious mass or evident calculi. Left Kidney: Renal measurements: 10.1 x 6.0 x 5.6 cm = volume: 177 mL. Echogenicity within normal limits. No mass or  hydronephrosis visualized. Bladder: Decompressed by Foley catheter. Other: Incidental layering sludge in the gallbladder. IMPRESSION: 1. Mild right hydronephrosis without identifiable cause. There is mild thinning of the right renal parenchyma which may indicate chronicity, but is nonspecific. No prior exams available for comparison. 2. Normal sonographic appearance of the left kidney. Electronically Signed   By: Narda Rutherford M.D.   On: 10/01/2019 02:56   DG Chest Port 1 View  Result Date: 09/30/2019 CLINICAL DATA:  Altered mental status EXAM: PORTABLE CHEST 1 VIEW COMPARISON:  Radiograph 08/25/2019 FINDINGS: Dense bandlike opacity in the right lung base likely reflecting subsegmental atelectasis with associated volume loss in the right hemithorax. No consolidative opacity. No pneumothorax or effusion. The cardiomediastinal contours are stable from prior. No acute osseous or soft tissue abnormality. Degenerative changes are present in the imaged spine and shoulders. Telemetry leads overlie the chest. IMPRESSION: Dense bandlike opacity in the right lung base likely reflecting subsegmental atelectasis. No other acute cardiopulmonary abnormality. Electronically Signed   By: Kreg Shropshire M.D.   On: 09/30/2019 22:48       Subjective: Denies any pain or discomfort.  Wishes to go home.  Discharge Exam: Vitals:   10/10/19 0530 10/10/19 0800  BP: (!) 121/52 (!) 131/55  Pulse: 90 87  Resp: 20 18  Temp: 98.2 F (36.8 C) 98 F (36.7 C)  SpO2: 94% 94%   Vitals:   10/09/19 1524 10/09/19 2008 10/10/19 0530 10/10/19 0800  BP: (!) 134/48 134/60 (!) 121/52 (!) 131/55  Pulse: 83 94 90 87  Resp: 18 20 20 18   Temp: 97.8 F (36.6 C) (!) 97.5 F (36.4 C) 98.2 F (36.8 C) 98 F (36.7 C)  TempSrc: Axillary Oral Oral Oral  SpO2: 100% 97% 94% 94%  Weight:      Height:        General: Elderly female lying in bed, frail, not in distress HEENT: Dry mucosa, supple neck Chest: Clear bilaterally CVs:  Normal S1-S2, no murmurs GI: Soft, nondistended, nontender, Foley with clear urine Musculoskeletal: Warm, no edema CNS: Alert and oriented to place      The results of significant diagnostics from this hospitalization (including imaging, microbiology, ancillary and laboratory) are listed below for reference.     Microbiology: Recent Results (from the past 240 hour(s))  Urine culture     Status: Abnormal   Collection Time: 09/30/19 10:09 PM   Specimen: Urine, Random  Result Value Ref Range Status   Specimen Description   Final    URINE, RANDOM Performed at [facility], [address] [place], [place] [address]    Special Requests   Final    NONE Performed at [facility], [address] [place], [place] [address]    Culture 80,000 COLONIES/mL ENTEROBACTER CLOACAE (A)  Final   Report Status 10/03/2019 FINAL  Final   Organism ID, Bacteria ENTEROBACTER CLOACAE (A)  Final      Susceptibility   Enterobacter cloacae - MIC*    CEFAZOLIN >=64 RESISTANT Resistant     CIPROFLOXACIN <=0.25 SENSITIVE Sensitive     GENTAMICIN <=1 SENSITIVE Sensitive     IMIPENEM 1 SENSITIVE Sensitive     NITROFURANTOIN 32 SENSITIVE Sensitive     TRIMETH/SULFA <=20 SENSITIVE Sensitive     PIP/TAZO 16 SENSITIVE Sensitive     * 80,000 COLONIES/mL ENTEROBACTER CLOACAE  Respiratory Panel by RT PCR (Flu A&B, Covid) - Urine, Clean Catch     Status: None   Collection Time: 09/30/19 10:09 PM   Specimen: Urine, Clean Catch  Result Value Ref Range Status   SARS Coronavirus 2 by RT PCR NEGATIVE NEGATIVE Final    Comment: (NOTE) SARS-CoV-2 target nucleic acids are NOT DETECTED. The SARS-CoV-2 RNA is generally detectable in upper respiratoy specimens during the acute phase of infection. The lowest concentration of SARS-CoV-2 viral copies this assay can detect is 131 copies/mL. A negative result does not preclude SARS-Cov-2 infection and should not be used as the sole basis for treatment  or other patient management decisions. A negative result may occur with  improper specimen collection/handling, submission of specimen other than nasopharyngeal swab, presence of viral mutation(s) within the areas targeted by this assay, and inadequate number of viral copies (<131 copies/mL). A negative result must be combined with clinical observations, patient history, and epidemiological information. The expected result is Negative. Fact Sheet for Patients:  PinkCheek.be Fact Sheet for Healthcare Providers:  GravelBags.it This test is not yet ap proved or cleared by the [place] FDA and  has been authorized for detection and/or diagnosis of SARS-CoV-2 by FDA under an Emergency Use Authorization (EUA). This EUA will remain  in effect (meaning this test can be used) for the duration of the COVID-19 declaration under Section 564(b)(1) of the Act, 21 U.S.C. section 360bbb-3(b)(1), unless the authorization is terminated or revoked sooner.    Influenza A by PCR NEGATIVE NEGATIVE Final   Influenza B by PCR NEGATIVE NEGATIVE Final    Comment: (NOTE) The Xpert Xpress SARS-CoV-2/FLU/RSV assay is intended as an aid in  the diagnosis of influenza from Nasopharyngeal swab specimens and  should not be used as a sole basis for treatment. Nasal washings and  aspirates are unacceptable for Xpert Xpress SARS-CoV-2/FLU/RSV  testing. Fact Sheet for Patients: https://www.moore.com/ Fact Sheet for Healthcare Providers: https://www.young.biz/ This test is not yet approved or cleared by the [place] FDA and  has been authorized for detection and/or diagnosis of SARS-CoV-2 by  FDA under an Emergency Use Authorization (EUA). This EUA will remain  in effect (meaning this test can be used) for the duration of the  Covid-19 declaration under Section 564(b)(1) of the Act, 21  U.S.C. section  360bbb-3(b)(1), unless the authorization is  terminated or revoked. Performed at [facility], [address] [place], [place] [address]   Culture, blood (routine x 2)     Status: None   Collection Time: 09/30/19 10:31 PM   Specimen: Left Antecubital; Blood  Result Value Ref Range Status   Specimen Description LEFT ANTECUBITAL  Final   Special Requests Blood Culture adequate volume  Final   Culture   Final    NO GROWTH 5 DAYS Performed at [facility], [address] [place], [place] [address]    Report Status 10/05/2019 FINAL  Final  Culture, blood (routine x 2)     Status: None   Collection Time: 09/30/19 10:36 PM   Specimen: BLOOD LEFT ARM  Result Value Ref Range Status   Specimen Description BLOOD LEFT ARM  Final   Special Requests Blood Culture adequate volume  Final   Culture   Final    NO GROWTH 5 DAYS Performed at [facility], [address] [place], [place] [address]    Report Status 10/05/2019 FINAL  Final  MRSA PCR Screening     Status: None   Collection Time: 10/08/19  7:00 PM   Specimen: Nasopharyngeal  Result Value Ref Range Status   MRSA by PCR NEGATIVE NEGATIVE Final    Comment:        The GeneXpert MRSA Assay (FDA approved for NASAL specimens only), is one component of a comprehensive MRSA colonization surveillance program. It is not intended to diagnose MRSA infection nor to guide or monitor treatment for MRSA infections. Performed at [facility], [address] [place], [place] [address]      Labs: BNP (last 3 results) No results for input(s): BNP in the last 8760 hours. Basic Metabolic Panel: Recent Labs  Lab 10/04/19 0532 10/04/19 0532 10/05/19 6378 10/05/19 0908 10/06/19 0610 10/07/19 0341 10/08/19 0451  NA 146*   < > 141 141 DUPLICATE REQUEST  140 139 136  K 3.9   < > 3.7 3.6 DUPLICATE REQUEST  3.1* 3.4* 3.7  CL 118*   < > 114* 114* DUPLICATE REQUEST  114* 112* 102  CO2 24   < > 18*  18* DUPLICATE REQUEST  23 22 24   GLUCOSE 122*   < > 122* 128* DUPLICATE REQUEST  121* 115* 109*  BUN 42*   < > 24* 23 DUPLICATE REQUEST  11 7* <5*  CREATININE 1.07*   < > 0.70 0.70 DUPLICATE REQUEST  0.51 0.45 0.48  CALCIUM 7.5*   < > 6.9* 6.9* DUPLICATE REQUEST  6.9* 7.1* 7.1*  MG  --   --   --  1.2* 1.8 1.7  --   PHOS 1.3*  --  2.5 2.5 DUPLICATE REQUEST  1.9* 2.4*  --    < > = values in this interval not displayed.   Liver Function Tests: Recent Labs  Lab 10/04/19 0532 10/05/19 0722 10/06/19 0610  ALBUMIN 1.7* 1.8* 1.6*   No results for input(s): LIPASE, AMYLASE in the last 168  hours. No results for input(s): AMMONIA in the last 168 hours. CBC: Recent Labs  Lab 10/04/19 0532 10/05/19 0908 10/06/19 0610 10/07/19 0341 10/08/19 0451  WBC 8.4 10.1 7.4 9.2 6.3  HGB 8.4* 8.9* 8.5* 8.4* 7.3*  HCT 27.4* 29.7* 26.6* 26.6* 22.4*  MCV 92.9 94.3 90.2 89.9 88.9  PLT 159 144* 129* 156 134*   Cardiac Enzymes: No results for input(s): CKTOTAL, CKMB, CKMBINDEX, TROPONINI in the last 168 hours. BNP: Invalid input(s): POCBNP CBG: Recent Labs  Lab 10/10/19 0054  GLUCAP 117*   D-Dimer No results for input(s): DDIMER in the last 72 hours. Hgb A1c No results for input(s): HGBA1C in the last 72 hours. Lipid Profile No results for input(s): CHOL, HDL, LDLCALC, TRIG, CHOLHDL, LDLDIRECT in the last 72 hours. Thyroid function studies No results for input(s): TSH, T4TOTAL, T3FREE, THYROIDAB in the last 72 hours.  Invalid input(s): FREET3 Anemia work up No results for input(s): VITAMINB12, FOLATE, FERRITIN, TIBC, IRON, RETICCTPCT in the last 72 hours. Urinalysis    Component Value Date/Time   COLORURINE AMBER (A) 09/30/2019 2209   APPEARANCEUR TURBID (A) 09/30/2019 2209   LABSPEC 1.017 09/30/2019 2209   PHURINE 5.0 09/30/2019 2209   GLUCOSEU NEGATIVE 09/30/2019 2209   HGBUR SMALL (A) 09/30/2019 2209   BILIRUBINUR NEGATIVE 09/30/2019 2209   KETONESUR NEGATIVE 09/30/2019 2209    PROTEINUR 100 (A) 09/30/2019 2209   NITRITE NEGATIVE 09/30/2019 2209   LEUKOCYTESUR SMALL (A) 09/30/2019 2209   Sepsis Labs Invalid input(s): PROCALCITONIN,  WBC,  LACTICIDVEN Microbiology Recent Results (from the past 240 hour(s))  Urine culture     Status: Abnormal   Collection Time: 09/30/19 10:09 PM   Specimen: Urine, Random  Result Value Ref Range Status   Specimen Description   Final    URINE, RANDOM Performed at [facility], [address] [place], [place] [address]    Special Requests   Final    NONE Performed at [facility], [address] [place], [place] [address]    Culture 80,000 COLONIES/mL ENTEROBACTER CLOACAE (A)  Final   Report Status 10/03/2019 FINAL  Final   Organism ID, Bacteria ENTEROBACTER CLOACAE (A)  Final      Susceptibility   Enterobacter cloacae - MIC*    CEFAZOLIN >=64 RESISTANT Resistant     CIPROFLOXACIN <=0.25 SENSITIVE Sensitive     GENTAMICIN <=1 SENSITIVE Sensitive     IMIPENEM 1 SENSITIVE Sensitive     NITROFURANTOIN 32 SENSITIVE Sensitive     TRIMETH/SULFA <=20 SENSITIVE Sensitive     PIP/TAZO 16 SENSITIVE Sensitive     * 80,000 COLONIES/mL ENTEROBACTER CLOACAE  Respiratory Panel by RT PCR (Flu A&B, Covid) - Urine, Clean Catch     Status: None   Collection Time: 09/30/19 10:09 PM   Specimen: Urine, Clean Catch  Result Value Ref Range Status   SARS Coronavirus 2 by RT PCR NEGATIVE NEGATIVE Final    Comment: (NOTE) SARS-CoV-2 target nucleic acids are NOT DETECTED. The SARS-CoV-2 RNA is generally detectable in upper respiratoy specimens during the acute phase of infection. The lowest concentration of SARS-CoV-2 viral copies this assay can detect is 131 copies/mL. A negative result does not preclude SARS-Cov-2 infection and should not be used as the sole basis for treatment or other patient management decisions. A negative result may occur with  improper specimen collection/handling, submission of specimen  other than nasopharyngeal swab, presence of viral mutation(s) within the areas targeted by this assay, and inadequate number of viral copies (<  131 copies/mL). A negative result must be combined with clinical observations, patient history, and epidemiological information. The expected result is Negative. Fact Sheet for Patients:  https://www.moore.com/ Fact Sheet for Healthcare Providers:  https://www.young.biz/ This test is not yet ap proved or cleared by the [place] FDA and  has been authorized for detection and/or diagnosis of SARS-CoV-2 by FDA under an Emergency Use Authorization (EUA). This EUA will remain  in effect (meaning this test can be used) for the duration of the COVID-19 declaration under Section 564(b)(1) of the Act, 21 U.S.C. section 360bbb-3(b)(1), unless the authorization is terminated or revoked sooner.    Influenza A by PCR NEGATIVE NEGATIVE Final   Influenza B by PCR NEGATIVE NEGATIVE Final    Comment: (NOTE) The Xpert Xpress SARS-CoV-2/FLU/RSV assay is intended as an aid in  the diagnosis of influenza from Nasopharyngeal swab specimens and  should not be used as a sole basis for treatment. Nasal washings and  aspirates are unacceptable for Xpert Xpress SARS-CoV-2/FLU/RSV  testing. Fact Sheet for Patients: https://www.moore.com/ Fact Sheet for Healthcare Providers: https://www.young.biz/ This test is not yet approved or cleared by the [place] FDA and  has been authorized for detection and/or diagnosis of SARS-CoV-2 by  FDA under an Emergency Use Authorization (EUA). This EUA will remain  in effect (meaning this test can be used) for the duration of the  Covid-19 declaration under Section 564(b)(1) of the Act, 21  U.S.C. section 360bbb-3(b)(1), unless the authorization is  terminated or revoked. Performed at [facility], [address] [place], [place]  [address]   Culture, blood (routine x 2)     Status: None   Collection Time: 09/30/19 10:31 PM   Specimen: Left Antecubital; Blood  Result Value Ref Range Status   Specimen Description LEFT ANTECUBITAL  Final   Special Requests Blood Culture adequate volume  Final   Culture   Final    NO GROWTH 5 DAYS Performed at [facility], [address] [place], [place] [address]    Report Status 10/05/2019 FINAL  Final  Culture, blood (routine x 2)     Status: None   Collection Time: 09/30/19 10:36 PM   Specimen: BLOOD LEFT ARM  Result Value Ref Range Status   Specimen Description BLOOD LEFT ARM  Final   Special Requests Blood Culture adequate volume  Final   Culture   Final    NO GROWTH 5 DAYS Performed at [facility], [address] [place], [place] [address]    Report Status 10/05/2019 FINAL  Final  MRSA PCR Screening     Status: None   Collection Time: 10/08/19  7:00 PM   Specimen: Nasopharyngeal  Result Value Ref Range Status   MRSA by PCR NEGATIVE NEGATIVE Final    Comment:        The GeneXpert MRSA Assay (FDA approved for NASAL specimens only), is one component of a comprehensive MRSA colonization surveillance program. It is not intended to diagnose MRSA infection nor to guide or monitor treatment for MRSA infections. Performed at [facility], [address], [place], [place] [address]      Time coordinating discharge: 35 minutes  SIGNED:   Eddie North, MD  [facility] 10/10/2019, 10:46 AM Pager   If 7PM-7AM, please contact night-coverage [email or web address] Password [email or web address]

## 2019-10-10 NOTE — Progress Notes (Signed)
   10/10/19 1400  Clinical Encounter Type  Visited With Patient  Visit Type Follow-up  Referral From Chaplain  Consult/Referral To Chaplain  While doing rounds, Chaplain visited with patient. Patient told Chaplain she was in pain. Out of all the Chaplain's visits to this room this is the first time the patient spoke in a manner that Chaplain could understand. Patient said that she was in pain and that her leg was hurting. Chaplain asked if she could pray with her and she said yes. Chaplain offered pastoral presence, empathy, and prayer. Chaplain went to nurse's station to tell nurse that patient was uncomfortable and find out patient was being discharged.

## 2019-10-10 NOTE — Care Management Important Message (Signed)
Important Message  Patient Details  Name: Jaime [name] MRN: [id number] Date of Birth: 06/13/1933   Medicare Important Message Given:  Yes     [name] 10/10/2019, 1:19 PM

## 2019-10-10 NOTE — TOC Transition Note (Signed)
Transition of Care [facility]) - CM/SW Discharge Note   Patient Details  Name: Jaime [name] MRN: [id number] Date of Birth: 1932-11-11  [facility] of Care [facility] CM/SW Contact:  Chapman Fitch, RN Phone Number: 10/10/2019, 1:31 PM   Clinical Narrative:     Family has decided to take patient home with hospiceservices through [employer]   Patient to discharge home today.  Daughter [name] notified Daughter in law [name] will be at the home to accept patient when EMS arrives.   Referral made to Clydie Braun with [employer]  Signed DNR on the chart EMS packet sent to the bedside RN EMS called  Final next level of care: Home w Hospice Care Barriers to Discharge: No Barriers Identified   Patient Goals and CMS Choice        Discharge Placement                  Name of family member notified: [name] and [name] Patient and family notified of of transfer: 10/10/19  Discharge Plan and Services   Discharge Planning Services: CM Consult                                 Social Determinants of Health (SDOH) Interventions     Readmission Risk Interventions Readmission Risk Prevention Plan 10/04/2019 04/21/2018  Post Dischage Appt - Complete  Medication Screening - Complete  Transportation Screening Complete Complete  PCP follow-up - Complete  Medication Review (RN Care Manager) Complete -  Some recent data might be hidden

## 2019-10-10 NOTE — Progress Notes (Signed)
New referral for [facility] at home received from [facility] Patient information given to referral. Writer spoke via telephone to patient's daughter [name] to initiate education regarding hospice services, philosophy and team approach to care with understanding voiced. No DME needed at this time. Plan is for patient to discharge home today via EMS with signed out of facility DNR in place in discharge packet. Per Sharon's request staff RN to contact patient's daughter in law Ashyah Quizon at  [phone] with discharge instructions.  Please also notify [name] when EMS arrives for transport. TOC Stephanie notified of this. Thank you for the opportunity to be involved in the care of this patient and her family. Dayna Barker BSN, RN, [facility] [employer] [phone]

## 2019-10-21 ENCOUNTER — Ambulatory Visit: Payer: Self-pay | Admitting: Physician Assistant

## 2019-11-17 DEATH — deceased

## 2019-12-01 ENCOUNTER — Encounter: Payer: Self-pay | Admitting: *Deleted

## 2020-10-30 IMAGING — CT CT HEAD W/O CM
3 series · 16 of 47 positions shown, 19 images · non-contrast
Comparison: 04/19/2018

CLINICAL DATA: Somnolent, history of recent lung bone fracture

EXAM:
CT HEAD WITHOUT CONTRAST
TECHNIQUE: Contiguous axial images were obtained from the base of the skull
through the vertex without intravenous contrast.

[Series 2: head wo · axial · 0.40mm/px · z∈[+443,+568]mm · 10 of 30 slices shown, 13 images]
[im 3/30  brain]
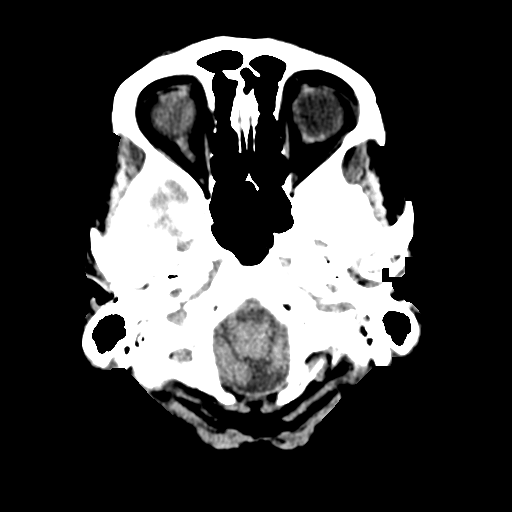
[im 3/30  bone]
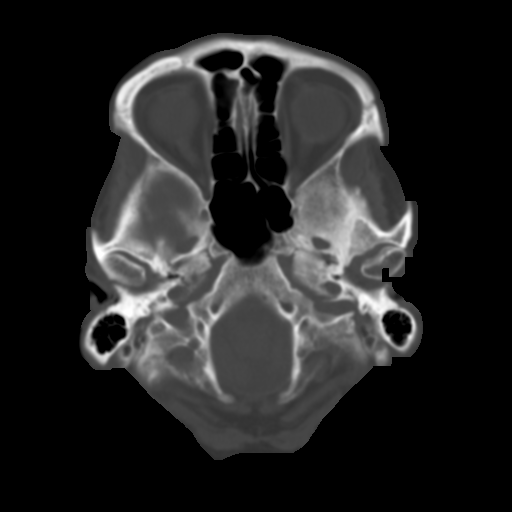
[im 6/30  brain]
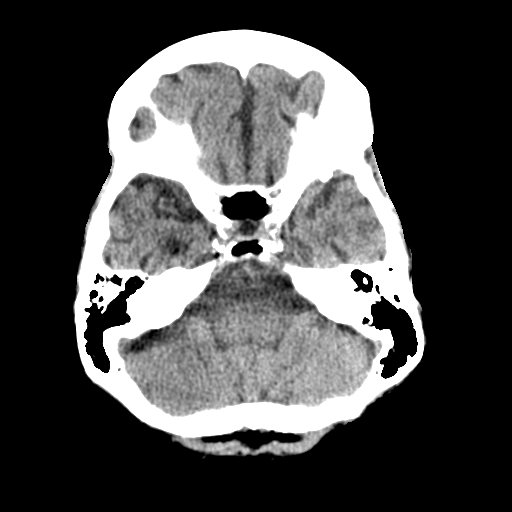
[im 9/30  brain]
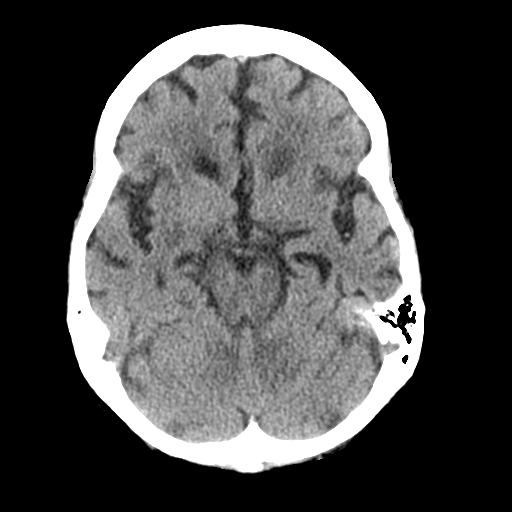
[im 11/30  brain]
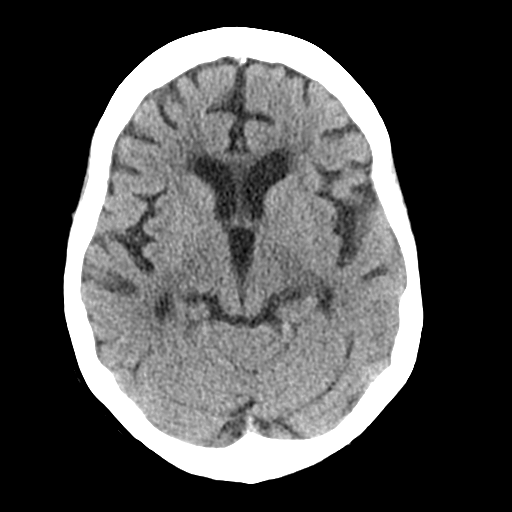
[im 14/30  brain]
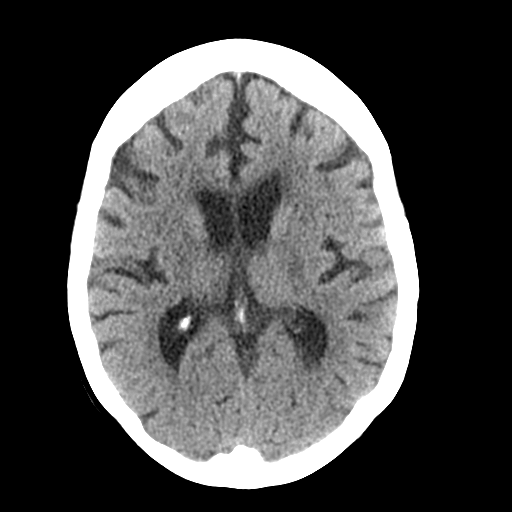
[im 14/30  bone]
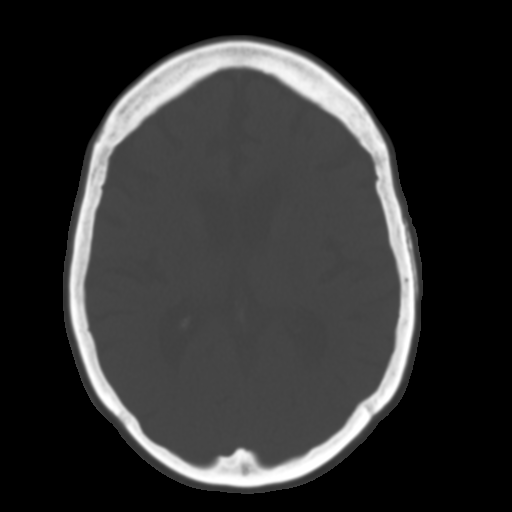
[im 17/30  brain]
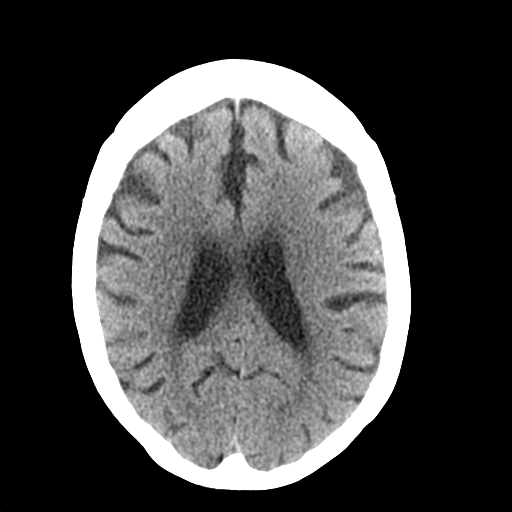
[im 20/30  brain]
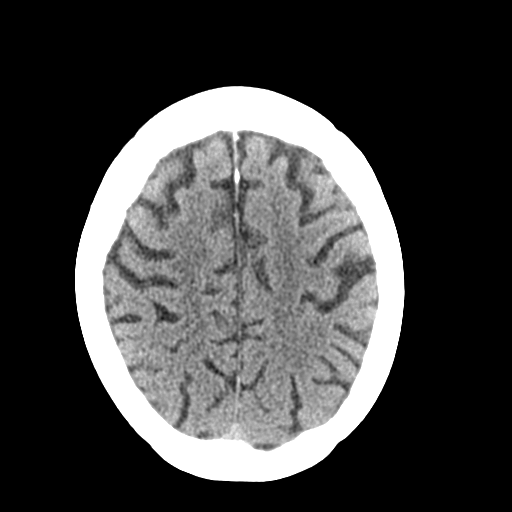
[im 23/30  brain]
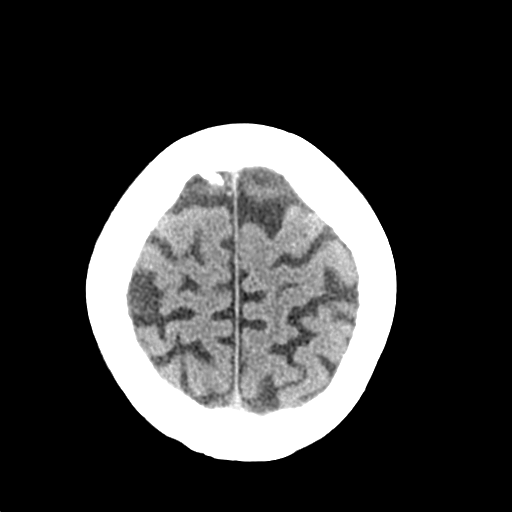
[im 25/30  brain]
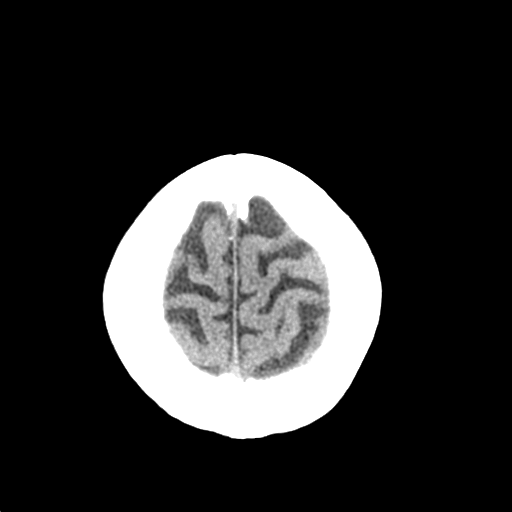
[im 25/30  bone]
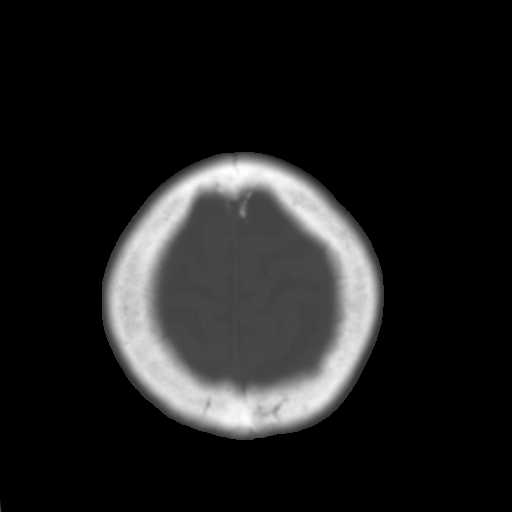
[im 28/30  brain]
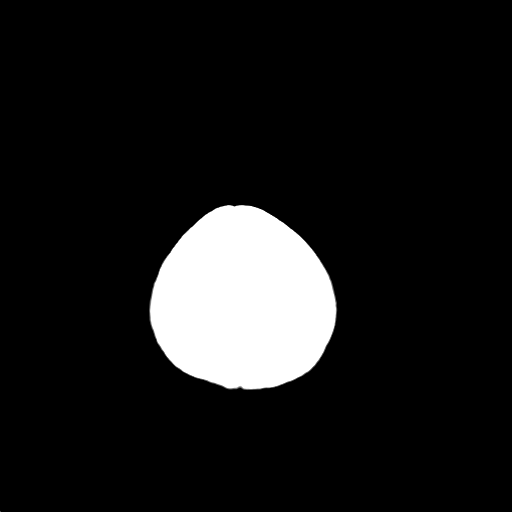

[Series 4: coronal soft tissue · coronal · 0.30mm/px · 3 of 61 slices shown]
[im 21/61  brain]
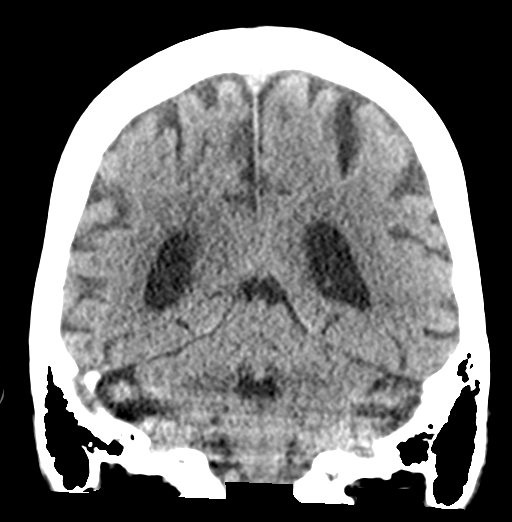
[im 27/61  brain]
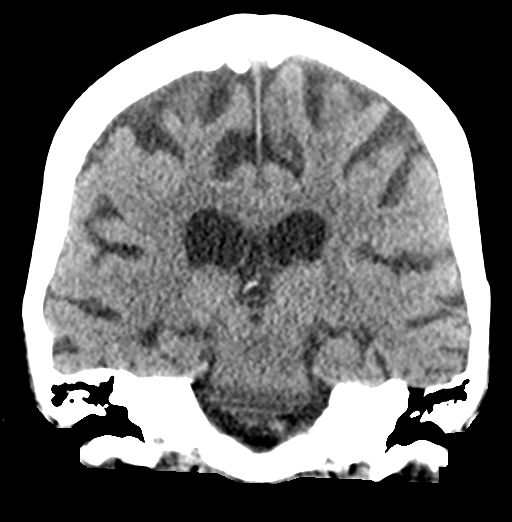
[im 34/61  brain]
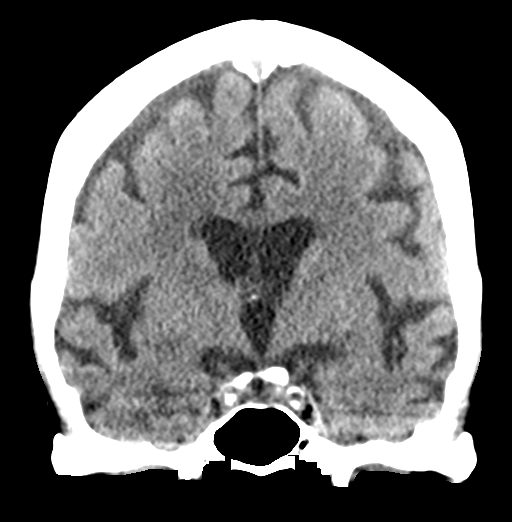

[Series 5: sagittal soft tissue · sagittal · 0.31mm/px · 3 of 46 slices shown]
[im 16/46  brain]
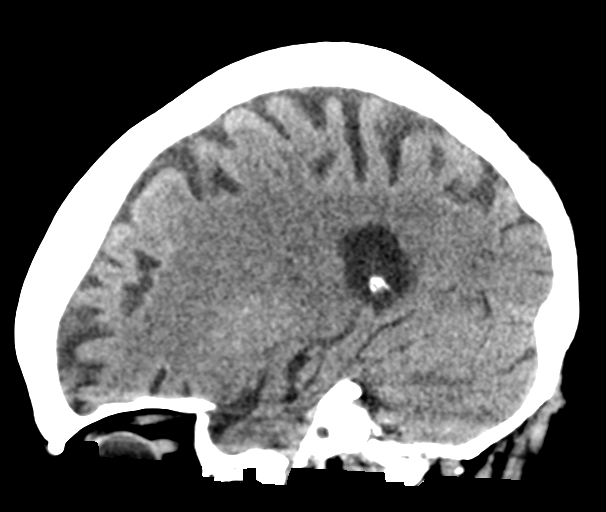
[im 23/46  brain]
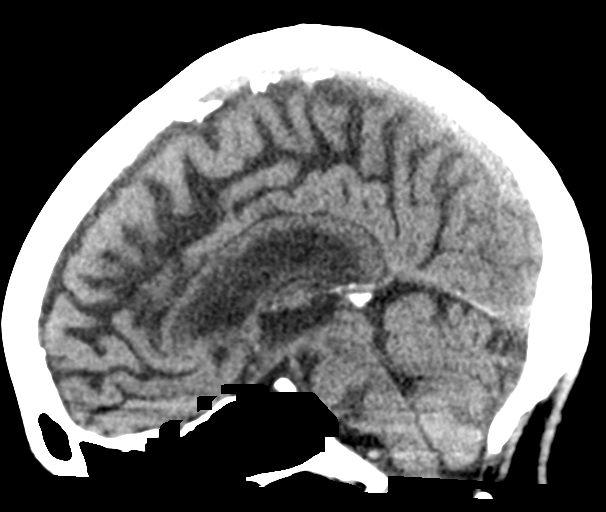
[im 31/46  brain]
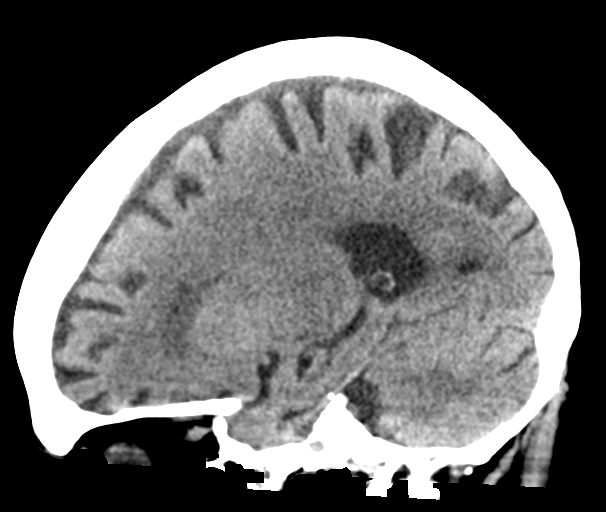

[16 of 47 positions shown; findings below may reference images not displayed]

FINDINGS: Brain: No acute infarct or hemorrhage. Lateral ventricles and
midline structures are unremarkable. No acute extra-axial fluid
collections. No mass effect.

Vascular: No hyperdense vessel or unexpected calcification.

Skull: Normal. Negative for fracture or focal lesion.

Sinuses/Orbits: No acute finding.

Other: None
IMPRESSION: 1. Stable head CT, no acute process.
